# Patient Record
Sex: Female | Born: 1935 | Race: White | Hispanic: No | Smoking: Former smoker
Health system: Southern US, Community
[De-identification: ages and names within clinical notes are randomized; demographics above are authoritative.]

## PROBLEM LIST (undated history)

## (undated) DIAGNOSIS — S22079A Unspecified fracture of T9-T10 vertebra, initial encounter for closed fracture: Secondary | ICD-10-CM

## (undated) DIAGNOSIS — F32A Depression, unspecified: Secondary | ICD-10-CM

## (undated) DIAGNOSIS — R5383 Other fatigue: Secondary | ICD-10-CM

## (undated) DIAGNOSIS — D649 Anemia, unspecified: Secondary | ICD-10-CM

## (undated) DIAGNOSIS — E039 Hypothyroidism, unspecified: Secondary | ICD-10-CM

## (undated) DIAGNOSIS — E785 Hyperlipidemia, unspecified: Secondary | ICD-10-CM

## (undated) DIAGNOSIS — I951 Orthostatic hypotension: Secondary | ICD-10-CM

## (undated) DIAGNOSIS — R011 Cardiac murmur, unspecified: Secondary | ICD-10-CM

## (undated) DIAGNOSIS — F329 Major depressive disorder, single episode, unspecified: Secondary | ICD-10-CM

## (undated) DIAGNOSIS — K219 Gastro-esophageal reflux disease without esophagitis: Secondary | ICD-10-CM

## (undated) DIAGNOSIS — R42 Dizziness and giddiness: Secondary | ICD-10-CM

## (undated) DIAGNOSIS — Z8679 Personal history of other diseases of the circulatory system: Secondary | ICD-10-CM

## (undated) DIAGNOSIS — Z9889 Other specified postprocedural states: Secondary | ICD-10-CM

## (undated) DIAGNOSIS — A0472 Enterocolitis due to Clostridium difficile, not specified as recurrent: Secondary | ICD-10-CM

## (undated) DIAGNOSIS — E2740 Unspecified adrenocortical insufficiency: Secondary | ICD-10-CM

## (undated) DIAGNOSIS — J449 Chronic obstructive pulmonary disease, unspecified: Secondary | ICD-10-CM

## (undated) DIAGNOSIS — J189 Pneumonia, unspecified organism: Secondary | ICD-10-CM

## (undated) DIAGNOSIS — Z950 Presence of cardiac pacemaker: Secondary | ICD-10-CM

## (undated) HISTORY — DX: Depression, unspecified: F32.A

## (undated) HISTORY — DX: Unspecified fracture of t9-t10 vertebra, initial encounter for closed fracture: S22.079A

## (undated) HISTORY — DX: Hyperlipidemia, unspecified: E78.5

## (undated) HISTORY — DX: Other specified postprocedural states: Z98.890

## (undated) HISTORY — PX: EYE SURGERY: SHX253

## (undated) HISTORY — PX: BREAST SURGERY: SHX581

## (undated) HISTORY — DX: Other fatigue: R53.83

## (undated) HISTORY — PX: BACK SURGERY: SHX140

## (undated) HISTORY — DX: Gastro-esophageal reflux disease without esophagitis: K21.9

## (undated) HISTORY — DX: Anemia, unspecified: D64.9

## (undated) HISTORY — PX: CHOLECYSTECTOMY: SHX55

## (undated) HISTORY — PX: VAGINAL HYSTERECTOMY: SUR661

## (undated) HISTORY — DX: Hypothyroidism, unspecified: E03.9

## (undated) HISTORY — DX: Major depressive disorder, single episode, unspecified: F32.9

## (undated) HISTORY — PX: CLAVICLE SURGERY: SHX598

## (undated) HISTORY — DX: Personal history of other diseases of the circulatory system: Z86.79

---

## 1996-02-18 HISTORY — PX: CARDIAC CATHETERIZATION: SHX172

## 2001-04-21 ENCOUNTER — Ambulatory Visit (HOSPITAL_COMMUNITY): Admission: RE | Admit: 2001-04-21 | Discharge: 2001-04-21 | Payer: Self-pay | Admitting: Family Medicine

## 2001-04-21 ENCOUNTER — Encounter: Payer: Self-pay | Admitting: Family Medicine

## 2001-05-31 ENCOUNTER — Encounter: Payer: Self-pay | Admitting: Family Medicine

## 2001-05-31 ENCOUNTER — Ambulatory Visit (HOSPITAL_COMMUNITY): Admission: RE | Admit: 2001-05-31 | Discharge: 2001-05-31 | Payer: Self-pay | Admitting: Family Medicine

## 2001-06-25 ENCOUNTER — Encounter: Payer: Self-pay | Admitting: Cardiology

## 2001-06-25 ENCOUNTER — Ambulatory Visit (HOSPITAL_COMMUNITY): Admission: RE | Admit: 2001-06-25 | Discharge: 2001-06-25 | Payer: Self-pay | Admitting: Cardiology

## 2002-02-17 HISTORY — PX: OTHER SURGICAL HISTORY: SHX169

## 2002-02-21 ENCOUNTER — Ambulatory Visit (HOSPITAL_COMMUNITY): Admission: RE | Admit: 2002-02-21 | Discharge: 2002-02-21 | Payer: Self-pay | Admitting: Internal Medicine

## 2002-06-29 ENCOUNTER — Ambulatory Visit (HOSPITAL_COMMUNITY): Admission: RE | Admit: 2002-06-29 | Discharge: 2002-06-29 | Payer: Self-pay | Admitting: Family Medicine

## 2002-06-29 ENCOUNTER — Encounter: Payer: Self-pay | Admitting: Family Medicine

## 2002-07-04 ENCOUNTER — Ambulatory Visit (HOSPITAL_COMMUNITY): Admission: RE | Admit: 2002-07-04 | Discharge: 2002-07-04 | Payer: Self-pay | Admitting: Family Medicine

## 2002-07-04 ENCOUNTER — Encounter: Payer: Self-pay | Admitting: Family Medicine

## 2002-07-07 ENCOUNTER — Other Ambulatory Visit: Admission: RE | Admit: 2002-07-07 | Discharge: 2002-07-07 | Payer: Self-pay | Admitting: Unknown Physician Specialty

## 2002-08-09 ENCOUNTER — Ambulatory Visit (HOSPITAL_COMMUNITY): Admission: RE | Admit: 2002-08-09 | Discharge: 2002-08-09 | Payer: Self-pay | Admitting: General Surgery

## 2004-12-31 ENCOUNTER — Emergency Department (HOSPITAL_COMMUNITY): Admission: EM | Admit: 2004-12-31 | Discharge: 2004-12-31 | Payer: Self-pay | Admitting: Emergency Medicine

## 2005-01-01 ENCOUNTER — Ambulatory Visit: Payer: Self-pay | Admitting: Orthopedic Surgery

## 2005-01-22 ENCOUNTER — Ambulatory Visit: Payer: Self-pay | Admitting: Orthopedic Surgery

## 2005-03-07 ENCOUNTER — Ambulatory Visit (HOSPITAL_COMMUNITY): Admission: RE | Admit: 2005-03-07 | Discharge: 2005-03-07 | Payer: Self-pay | Admitting: Family Medicine

## 2006-09-21 ENCOUNTER — Ambulatory Visit (HOSPITAL_COMMUNITY): Admission: RE | Admit: 2006-09-21 | Discharge: 2006-09-21 | Payer: Self-pay | Admitting: Family Medicine

## 2008-05-22 ENCOUNTER — Ambulatory Visit: Payer: Self-pay | Admitting: Cardiology

## 2008-05-22 ENCOUNTER — Ambulatory Visit (HOSPITAL_COMMUNITY): Admission: RE | Admit: 2008-05-22 | Discharge: 2008-05-22 | Payer: Self-pay | Admitting: Family Medicine

## 2008-05-22 ENCOUNTER — Encounter: Payer: Self-pay | Admitting: Family Medicine

## 2008-05-31 ENCOUNTER — Ambulatory Visit: Payer: Self-pay | Admitting: Cardiology

## 2008-06-01 ENCOUNTER — Ambulatory Visit (HOSPITAL_COMMUNITY): Admission: RE | Admit: 2008-06-01 | Discharge: 2008-06-01 | Payer: Self-pay | Admitting: Cardiology

## 2008-06-04 ENCOUNTER — Ambulatory Visit: Payer: Self-pay | Admitting: Cardiology

## 2008-06-27 ENCOUNTER — Ambulatory Visit: Payer: Self-pay | Admitting: Cardiology

## 2008-07-07 ENCOUNTER — Ambulatory Visit (HOSPITAL_COMMUNITY): Payer: Self-pay | Admitting: Oncology

## 2008-07-07 ENCOUNTER — Encounter (HOSPITAL_COMMUNITY): Admission: RE | Admit: 2008-07-07 | Discharge: 2008-08-06 | Payer: Self-pay | Admitting: Oncology

## 2008-08-16 ENCOUNTER — Encounter (HOSPITAL_COMMUNITY): Admission: RE | Admit: 2008-08-16 | Discharge: 2008-09-15 | Payer: Self-pay | Admitting: Oncology

## 2008-08-29 ENCOUNTER — Ambulatory Visit (HOSPITAL_COMMUNITY): Payer: Self-pay | Admitting: Oncology

## 2008-09-28 ENCOUNTER — Encounter (HOSPITAL_COMMUNITY): Admission: RE | Admit: 2008-09-28 | Discharge: 2008-10-28 | Payer: Self-pay | Admitting: Oncology

## 2008-10-24 DIAGNOSIS — E039 Hypothyroidism, unspecified: Secondary | ICD-10-CM | POA: Insufficient documentation

## 2008-10-24 DIAGNOSIS — Z8659 Personal history of other mental and behavioral disorders: Secondary | ICD-10-CM

## 2008-10-24 DIAGNOSIS — E785 Hyperlipidemia, unspecified: Secondary | ICD-10-CM

## 2008-10-24 DIAGNOSIS — I2510 Atherosclerotic heart disease of native coronary artery without angina pectoris: Secondary | ICD-10-CM | POA: Insufficient documentation

## 2008-10-24 HISTORY — DX: Hyperlipidemia, unspecified: E78.5

## 2008-12-14 ENCOUNTER — Encounter (HOSPITAL_COMMUNITY): Admission: RE | Admit: 2008-12-14 | Discharge: 2009-01-13 | Payer: Self-pay | Admitting: Oncology

## 2008-12-14 ENCOUNTER — Ambulatory Visit (HOSPITAL_COMMUNITY): Payer: Self-pay | Admitting: Oncology

## 2009-01-04 ENCOUNTER — Telehealth: Payer: Self-pay | Admitting: Cardiology

## 2009-01-05 ENCOUNTER — Ambulatory Visit: Payer: Self-pay | Admitting: Cardiology

## 2009-01-16 ENCOUNTER — Ambulatory Visit: Payer: Self-pay | Admitting: Cardiology

## 2009-01-17 ENCOUNTER — Ambulatory Visit: Payer: Self-pay | Admitting: Internal Medicine

## 2009-01-17 DIAGNOSIS — I498 Other specified cardiac arrhythmias: Secondary | ICD-10-CM

## 2009-01-17 HISTORY — PX: INSERT / REPLACE / REMOVE PACEMAKER: SUR710

## 2009-01-18 ENCOUNTER — Encounter: Payer: Self-pay | Admitting: Internal Medicine

## 2009-01-18 ENCOUNTER — Telehealth: Payer: Self-pay | Admitting: Cardiology

## 2009-01-19 LAB — CONVERTED CEMR LAB
BUN: 19 mg/dL (ref 6–23)
CO2: 26 meq/L (ref 19–32)
Calcium: 8.9 mg/dL (ref 8.4–10.5)
Eosinophils Relative: 3 % (ref 0–5)
HCT: 30.3 % — ABNORMAL LOW (ref 36.0–46.0)
Hemoglobin: 10.1 g/dL — ABNORMAL LOW (ref 12.0–15.0)
Lymphs Abs: 2.3 10*3/uL (ref 0.7–4.0)
MCHC: 33.3 g/dL (ref 30.0–36.0)
Monocytes Relative: 9 % (ref 3–12)
Neutro Abs: 5 10*3/uL (ref 1.7–7.7)
Prothrombin Time: 12.5 s (ref 11.6–15.2)

## 2009-01-23 ENCOUNTER — Ambulatory Visit: Payer: Self-pay | Admitting: Internal Medicine

## 2009-01-23 ENCOUNTER — Inpatient Hospital Stay (HOSPITAL_COMMUNITY): Admission: AD | Admit: 2009-01-23 | Discharge: 2009-01-24 | Payer: Self-pay | Admitting: Internal Medicine

## 2009-01-24 ENCOUNTER — Encounter: Payer: Self-pay | Admitting: Internal Medicine

## 2009-01-25 ENCOUNTER — Encounter: Payer: Self-pay | Admitting: Cardiology

## 2009-02-05 ENCOUNTER — Ambulatory Visit: Payer: Self-pay | Admitting: Cardiology

## 2009-02-06 ENCOUNTER — Ambulatory Visit (HOSPITAL_COMMUNITY): Payer: Self-pay | Admitting: Oncology

## 2009-02-20 ENCOUNTER — Encounter (HOSPITAL_COMMUNITY): Admission: RE | Admit: 2009-02-20 | Discharge: 2009-03-22 | Payer: Self-pay | Admitting: Oncology

## 2009-03-05 ENCOUNTER — Emergency Department (HOSPITAL_COMMUNITY)
Admission: EM | Admit: 2009-03-05 | Discharge: 2009-03-05 | Payer: Self-pay | Source: Home / Self Care | Admitting: Emergency Medicine

## 2009-03-06 DIAGNOSIS — M949 Disorder of cartilage, unspecified: Secondary | ICD-10-CM

## 2009-03-06 DIAGNOSIS — M810 Age-related osteoporosis without current pathological fracture: Secondary | ICD-10-CM | POA: Insufficient documentation

## 2009-03-06 DIAGNOSIS — Z860100 Personal history of colon polyps, unspecified: Secondary | ICD-10-CM | POA: Insufficient documentation

## 2009-03-06 DIAGNOSIS — M899 Disorder of bone, unspecified: Secondary | ICD-10-CM | POA: Insufficient documentation

## 2009-03-06 DIAGNOSIS — K219 Gastro-esophageal reflux disease without esophagitis: Secondary | ICD-10-CM | POA: Insufficient documentation

## 2009-03-06 DIAGNOSIS — F329 Major depressive disorder, single episode, unspecified: Secondary | ICD-10-CM

## 2009-03-06 DIAGNOSIS — R197 Diarrhea, unspecified: Secondary | ICD-10-CM

## 2009-03-07 ENCOUNTER — Ambulatory Visit: Payer: Self-pay | Admitting: Internal Medicine

## 2009-03-07 ENCOUNTER — Ambulatory Visit: Payer: Self-pay | Admitting: Gastroenterology

## 2009-03-07 ENCOUNTER — Ambulatory Visit (HOSPITAL_COMMUNITY): Admission: RE | Admit: 2009-03-07 | Discharge: 2009-03-07 | Payer: Self-pay | Admitting: Internal Medicine

## 2009-03-07 DIAGNOSIS — R1319 Other dysphagia: Secondary | ICD-10-CM

## 2009-03-07 DIAGNOSIS — D126 Benign neoplasm of colon, unspecified: Secondary | ICD-10-CM

## 2009-03-12 ENCOUNTER — Encounter: Payer: Self-pay | Admitting: Internal Medicine

## 2009-04-25 ENCOUNTER — Ambulatory Visit: Payer: Self-pay | Admitting: Internal Medicine

## 2009-04-25 DIAGNOSIS — Z950 Presence of cardiac pacemaker: Secondary | ICD-10-CM | POA: Insufficient documentation

## 2009-07-10 ENCOUNTER — Ambulatory Visit (HOSPITAL_COMMUNITY): Payer: Self-pay | Admitting: Oncology

## 2009-07-10 ENCOUNTER — Encounter (HOSPITAL_COMMUNITY): Admission: RE | Admit: 2009-07-10 | Discharge: 2009-08-09 | Payer: Self-pay | Admitting: Oncology

## 2010-01-14 ENCOUNTER — Encounter (HOSPITAL_COMMUNITY): Admission: RE | Admit: 2010-01-14 | Payer: Self-pay | Admitting: Oncology

## 2010-03-18 ENCOUNTER — Ambulatory Visit: Admit: 2010-03-18 | Payer: Self-pay | Admitting: Family Medicine

## 2010-03-19 NOTE — Assessment & Plan Note (Signed)
Summary: FOREIGN BODY SENSATION WHEN SHE SWALLOWS/SS   Visit Type:  Initial Consult Referring Provider:  [facility] ED, Sunnie Nielsen Primary Care Provider:  [name]  Chief Complaint:  foreign body sensation when swallows.  History of Present Illness: Ms. Kaitlyn Cooper is a pleasant 77 y/o WF, patient of Dr. [name], who presents today at the request of Dr. Sunnie Nielsen with [facility] ED for further evaluation of dysphagia and recent foreign body sensation.  Patient c/o progressive dysphagia for three years to mostly solids but sometimes pills.  On Monday, after breakfast, she had sensation of food stuck in esophagus.  She had pain with swallowing her saliva.  In ED, she was able to swallow her saliva.  CXR showed left lung base ATX, COPD.  Soft tissue neck film showed ?enlarged thyroid.  She was on Zantac for heartburn.  Dr. Dierdre Highman started her on omeprazole.  Denies N/V, abd pain, melena, brbpr, constipation, diarrhea. Yesterday she had difficulty swallowing her pills.  She is on a soft diet right now.       Current Medications (verified): 1)  Synthroid 75 Mcg Tabs (Levothyroxine Sodium) .... Take 1 Tab Daily 2)  Zoloft 100 Mg Tabs (Sertraline Hcl) .... Take 1 Tab Daily 3)  Premarin 0.625 Mg Tabs (Estrogens Conjugated) .... Take 1 Tab Daily 4)  Mevacor 20 Mg Tabs (Lovastatin) .... Take 1 Tab Daily 5)  Vitamin D 400 Unit Tabs (Cholecalciferol) .... Take 1 Tab Daily 6)  Vitamin B-12 100 Mcg Tabs (Cyanocobalamin) .... Injections 1 Time Monthly 7)  Tylenol 325 Mg Tabs (Acetaminophen) .... As Needed 8)  Prilosec Otc  20 Mg .... Twice Daily X 3 Days Beginning 03/06/2009, Then Once Daily  Allergies (verified): 1)  ! Penicillin  Past History:  Past Medical History: Current Problems:  ANEMIA (ICD-285.9), followed by Dr. Mariel Sleet HYPERLIPIDEMIA (ICD-272.4) DEPRESSION, HX OF (ICD-V11.8) HYPOTHYROIDISM (ICD-244.9) ATHEROSCLEROTIC CARDIOVASCULAR DISEASE (ICD-429.2) TCS, 02/2002 showed few diverticula  at sigmoid colon, otherwise normal colonoscopy and     terminal ileoscopy. TCS, 1995 several colon polyps, tubular adenoma EGD at time of cholecystectomy, late 1990s Chronic GERD [employer] Jude Cardiac pacemaker place 01/17/09.  Past Surgical History: cath 1998 Cholecystectomy Excision of right temporal carcinoma, 2004  Family History: Father:deceased had parkinson disease Mother:deceased alzhimers, colon polyp with invasive carcinoma s/p segmental resection and lived >20 years Siblings:2 sisters alive and well  Social History: Retired  Married, no children Tobacco Use - No, remote Alcohol Use - no Regular Exercise - no Drug Use - no  Review of Systems General:  Denies fever, chills, sweats, anorexia, fatigue, weakness, and weight loss. Eyes:  Denies vision loss. ENT:  Complains of difficulty swallowing; denies nasal congestion, sore throat, and hoarseness. CV:  Denies chest pains, angina, palpitations, dyspnea on exertion, and peripheral edema. Resp:  Denies dyspnea at rest, dyspnea with exercise, and cough. GI:  See HPI. GU:  Denies urinary burning and blood in urine. MS:  Complains of joint pain / LOM. Derm:  Denies rash and itching. Neuro:  Denies weakness, paralysis, frequent headaches, memory loss, and confusion. Psych:  Denies depression and anxiety. Endo:  Denies unusual weight change. Heme:  Denies bruising and bleeding. Allergy:  Denies hives and rash.  Vital Signs:  Patient profile:   77 year old female Height:      64 inches Weight:      119.50 pounds BMI:     20.59 Temp:     97.5 degrees F oral Pulse rate:   64 /  minute BP sitting:   100 / 60  (left arm) Cuff size:   regular  Vitals Entered By: Cloria Spring LPN (March 07, 2009 11:44 AM)  Physical Exam  General:  Well developed, well nourished, no acute distress. Head:  Normocephalic and atraumatic. Eyes:  Conjunctivae pink, no scleral icterus.  Mouth:  Oropharyngeal mucosa moist, pink.  No lesions,  erythema or exudate.    Neck:  Supple; no masses or thyromegaly. Lungs:  Clear throughout to auscultation. Heart:  Regular rate and rhythm; no murmurs, rubs,  or bruits. Abdomen:  Bowel sounds normal.  Abdomen is soft, nontender, nondistended.  No rebound or guarding.  No hepatosplenomegaly, masses or hernias.  No abdominal bruits.  Extremities:  No clubbing, cyanosis, edema or deformities noted. Neurologic:  Alert and  oriented x4;  grossly normal neurologically. Skin:  Intact without significant lesions or rashes. Cervical Nodes:  No significant cervical adenopathy. Psych:  Alert and cooperative. Normal mood and affect.  Impression & Recommendations:  Problem # 1:  DYSPHAGIA (ZOX-096.04)  Three year h/o dysphagia.  Recent episode of foreign body sensation with odynophagia.  ?esophageal stricture with transient obstruction +/- esophagitis.  Discussed with Dr. Jena Gauss.  Will proceed with EGD/ED today.  EGD to be performed in near future.  Risks, alternatives, benefits including but not limited to risk of reaction to medications, bleeding, infection, and perforation addressed.  Patient voiced understanding and verbal consent obtained. She will continue omeprazole as planned.  Problem # 2:  COLONIC POLYPS, ADENOMATOUS (ICD-211.3)  H/O tubular adenoma in 1995.  No polyps in 2004.  Due for surveillance TCS. Will wait until dysphagia is addressed, then schedule TCS if patient agrees.  She has been reluctant to undergo TCS in past.  Problem # 3:  GASTROESOPHAGEAL REFLUX DISEASE, CHRONIC (ICD-530.81)  Recently on Zantac.  Agree with PPI. EGD as planned.  Other Orders: Consultation Level III (54098)    I would like to thank Dr. Sunnie Nielsen for allowing [place] to take part in the care of this nice patient.

## 2010-03-19 NOTE — Letter (Signed)
Summary: EGD ORDER  EGD ORDER   Imported By: Diana Eves 03/12/2009 14:08:23  _____________________________________________________________________  External Attachment:    Type:   Image     Comment:   External Document

## 2010-03-19 NOTE — Assessment & Plan Note (Signed)
Summary: 3 mth post hosp f/u per Loura Pardon on 01/24/09/tg   Visit Type:  Follow-up Referring Provider:  [facility] ED, Sunnie Nielsen Primary Provider:  [name]  CC:  no complaints today.  History of Present Illness: Kaitlyn Cooper returns today for followup.  She is a pleasant 77 yo woman with a h/o symptomatic bradycardia s/p PPM.  She feels improved but does not have as much additional energy as she thought she would.  Rare palpitations. No syncope or dizzy spells.  Current Medications (verified): 1)  Synthroid 75 Mcg Tabs (Levothyroxine Sodium) .... Take 1 Tab Daily 2)  Zoloft 100 Mg Tabs (Sertraline Hcl) .... Take 1 Tab Daily 3)  Premarin 0.625 Mg Tabs (Estrogens Conjugated) .... Take 1 Tab Daily 4)  Mevacor 20 Mg Tabs (Lovastatin) .... Take 1 Tab Daily 5)  Vitamin D 400 Unit Tabs (Cholecalciferol) .... Take 1 Tab Daily 6)  Vitamin B-12 100 Mcg Tabs (Cyanocobalamin) .... Injections 1 Time Monthly 7)  Tylenol 325 Mg Tabs (Acetaminophen) .... As Needed 8)  Prilosec Otc  20 Mg .... Twice Daily X 3 Days Beginning 03/06/2009, Then Once Daily  Allergies (verified): 1)  ! Penicillin  Past History:  Past Medical History: Last updated: 03/07/2009 Current Problems:  ANEMIA (ICD-285.9), followed by Dr. Alethia Berthold (ICD-272.4) DEPRESSION, HX OF (ICD-V11.8) HYPOTHYROIDISM (ICD-244.9) ATHEROSCLEROTIC CARDIOVASCULAR DISEASE (ICD-429.2) TCS, 02/2002 showed few diverticula at sigmoid colon, otherwise normal colonoscopy and     terminal ileoscopy. TCS, 1995 several colon polyps, tubular adenoma EGD at time of cholecystectomy, late 1990s Chronic GERD St Jude Cardiac pacemaker place 01/17/09.  Past Surgical History: Last updated: 03/07/2009 cath 1998 Cholecystectomy Excision of right temporal carcinoma, 2004  Review of Systems  The patient denies chest pain, syncope, dyspnea on exertion, and peripheral edema.    Vital Signs:  Patient profile:   77 year old female Weight:       122 pounds Pulse rate:   60 / minute BP sitting:   150 / 63  (right arm)  Vitals Entered By: Dreama Saa, CNA (April 25, 2009 3:31 PM)  Physical Exam  General:  Elderly, well developed, well nourished, in no acute distress.  HEENT: normal Neck: supple. No JVD. Carotids 2+ bilaterally no bruits Cor: RRR no rubs, gallops or murmur Lungs: CTA. Well healed PPM incision. Ab: soft, nontender. nondistended. No HSM. Good bowel sounds Ext: warm. no cyanosis, clubbing or edema Neuro: alert and oriented. Grossly nonfocal. affect pleasant    PPM Specifications Following MD:  Lewayne Bunting, MD     PPM Vendor:  [employer] Jude     PPM Model Number:  [id number]     PPM Serial Number:  [id number] PPM DOI:  01/23/2009     PPM Implanting MD:  Lewayne Bunting, MD  Lead 1    Location: RA     DOI: 01/23/2009     Model #: [id number]     Serial #: [id number]     Status: active Lead 2    Location: RV     DOI: 01/23/2009     Model #: [id number]     Serial #: [id number]     Status: active  Magnet Response Rate:  BOL 100 ERI 85    PPM Follow Up Remote Check?  No Battery Voltage:  2.98 V     Battery Est. Longevity:  10.4 years     Pacer Dependent:  No       PPM Device Measurements Atrium  Amplitude: 4.7 mV, Impedance:  480 ohms, Threshold: 0.875 V at 0.4 msec Right Ventricle  Amplitude: 12 mV, Impedance: 650 ohms, Threshold: 0.625 V at 0.4 msec  Episodes MS Episodes:  8     Percent Mode Switch:  <1%     Coumadin:  No Atrial Pacing:  87%     Ventricular Pacing:  <1%  Parameters Mode:  DDDR     Lower Rate Limit:  60     Upper Rate Limit:  110 Paced AV Delay:  250     Sensed AV Delay:  250 Next Cardiology Appt Due:  01/17/2010 Tech Comments:  No parameter changes.  Device function normal.  ROV 12/11 with Dr. Ladona Ridgel in [facility] Altha Harm, LPN  April 25, 4032 4:17 PM  MD Comments:  Agree with above.  Impression & Recommendations:  Problem # 1:  CARDIAC PACEMAKER IN SITU (ICD-V45.01) Her device is working normally.  Will  recheck in 9 months.  Problem # 2:  ESSENTIAL HYPERTENSION, BENIGN (ICD-401.1) Her blood pressure is elevated today.  I have asked her to continue to monitor her blood pressure. She may need medication for HTN.

## 2010-03-19 NOTE — Miscellaneous (Signed)
Summary: Home Care Report  Home Care Report   Imported By: Faythe Ghee 03/12/2009 13:38:03  _____________________________________________________________________  External Attachment:    Type:   Image     Comment:   External Document

## 2010-03-25 ENCOUNTER — Encounter: Payer: Self-pay | Admitting: Internal Medicine

## 2010-03-28 ENCOUNTER — Encounter (INDEPENDENT_AMBULATORY_CARE_PROVIDER_SITE_OTHER): Payer: Self-pay | Admitting: *Deleted

## 2010-04-04 NOTE — Letter (Signed)
Summary: Appointment - Missed  [facility] [place]  [address] [place] [address]   Phone: [phone]  Fax: [phone]     March 28, 2010 MRN: [id number]   Kaitlyn Cooper [address] [place], [place]  [address]   Dear Ms. Kaitlyn Cooper,  Our records indicate you missed your appointment on    03/25/10                    with Dr. Ladona Ridgel      .                                    It is very important that we reach you to reschedule this appointment. We look forward to participating in your health care needs. Please contact us at the number listed above at your earliest convenience to reschedule this appointment.     Sincerely,    Glass blower/designer

## 2010-04-26 ENCOUNTER — Encounter: Payer: Self-pay | Admitting: *Deleted

## 2010-05-05 LAB — RETICULIN ANTIBODIES, IGA W TITER: Reticulin Ab, IgA: NEGATIVE

## 2010-05-05 LAB — TISSUE TRANSGLUTAMINASE, IGA: Tissue Transglutaminase Ab, IgA: 1.4 U/mL (ref <7)

## 2010-05-05 LAB — GLIADIN ANTIBODIES, SERUM
Gliadin IgA: 2 U/mL (ref <7)
Gliadin IgG: 0.5 U/mL (ref <7)

## 2010-05-06 LAB — C-REACTIVE PROTEIN: CRP: 0.6 mg/dL — ABNORMAL HIGH (ref <0.6)

## 2010-05-06 LAB — CBC
HCT: 31.3 % — ABNORMAL LOW (ref 36.0–46.0)
MCV: 90.5 fL (ref 78.0–100.0)
Platelets: 263 10*3/uL (ref 150–400)
RBC: 3.45 MIL/uL — ABNORMAL LOW (ref 3.87–5.11)

## 2010-05-06 LAB — SEDIMENTATION RATE: Sed Rate: 68 mm/hr — ABNORMAL HIGH (ref 0–22)

## 2010-05-20 LAB — CBC
HCT: 33 % — ABNORMAL LOW (ref 36.0–46.0)
Hemoglobin: 11.1 g/dL — ABNORMAL LOW (ref 12.0–15.0)
MCV: 92.5 fL (ref 78.0–100.0)
Platelets: 239 10*3/uL (ref 150–400)
RBC: 3.57 MIL/uL — ABNORMAL LOW (ref 3.87–5.11)
WBC: 8.1 10*3/uL (ref 4.0–10.5)

## 2010-05-20 LAB — SEDIMENTATION RATE: Sed Rate: 70 mm/hr — ABNORMAL HIGH (ref 0–22)

## 2010-05-23 LAB — CBC
Platelets: 249 10*3/uL (ref 150–400)
RBC: 3.34 MIL/uL — ABNORMAL LOW (ref 3.87–5.11)

## 2010-05-23 LAB — DIFFERENTIAL
Eosinophils Relative: 3 % (ref 0–5)
Lymphocytes Relative: 27 % (ref 12–46)
Lymphs Abs: 2.4 10*3/uL (ref 0.7–4.0)
Monocytes Absolute: 0.7 10*3/uL (ref 0.1–1.0)

## 2010-05-23 LAB — SEDIMENTATION RATE: Sed Rate: 68 mm/hr — ABNORMAL HIGH (ref 0–22)

## 2010-05-23 LAB — IRON AND TIBC
Saturation Ratios: 17 % — ABNORMAL LOW (ref 20–55)
UIBC: 309 ug/dL

## 2010-05-25 LAB — DIFFERENTIAL
Basophils Absolute: 0.1 10*3/uL (ref 0.0–0.1)
Basophils Relative: 1 % (ref 0–1)
Eosinophils Absolute: 0.3 10*3/uL (ref 0.0–0.7)
Lymphs Abs: 2.3 10*3/uL (ref 0.7–4.0)
Neutrophils Relative %: 59 % (ref 43–77)

## 2010-05-25 LAB — CBC
MCHC: 34.8 g/dL (ref 30.0–36.0)
Platelets: 261 10*3/uL (ref 150–400)
RBC: 3.39 MIL/uL — ABNORMAL LOW (ref 3.87–5.11)

## 2010-05-25 LAB — RETICULOCYTES: Retic Ct Pct: 1 % (ref 0.4–3.1)

## 2010-05-25 LAB — C-REACTIVE PROTEIN: CRP: 0.8 mg/dL — ABNORMAL HIGH (ref <0.6)

## 2010-05-25 LAB — SEDIMENTATION RATE: Sed Rate: 60 mm/hr — ABNORMAL HIGH (ref 0–22)

## 2010-05-26 LAB — PROTEIN, URINE, 24 HOUR
Protein, Urine: <6 mg/dL
Urine Total Volume-UPROT: 1325 mL

## 2010-05-26 LAB — CBC
MCHC: 34.4 g/dL (ref 30.0–36.0)
MCV: 91.7 fL (ref 78.0–100.0)
RDW: 14 % (ref 11.5–15.5)

## 2010-05-26 LAB — UIFE/LIGHT CHAINS/TP QN, 24-HR UR
Alpha 1, Urine: NOT DETECTED
Beta, Urine: NOT DETECTED
Free Kappa Lt Chains,Ur: 1.28 mg/dL (ref 0.04–1.51)
Free Lambda Excretion/Day: 2.12 mg/d
Free Lambda Lt Chains,Ur: 0.16 mg/dL (ref 0.08–1.01)
Gamma Globulin, Urine: NOT DETECTED
Time: 24 hours
Total Protein, Urine-Ur/day: 27 mg/d (ref 10–140)

## 2010-05-26 LAB — BASIC METABOLIC PANEL
BUN: 23 mg/dL (ref 6–23)
CO2: 23 mEq/L (ref 19–32)
Calcium: 9.3 mg/dL (ref 8.4–10.5)
Creatinine, Ser: 1.19 mg/dL (ref 0.4–1.2)
GFR calc Af Amer: 53 mL/min — ABNORMAL LOW (ref >60)
Glucose, Bld: 126 mg/dL — ABNORMAL HIGH (ref 70–99)

## 2010-05-26 LAB — DIFFERENTIAL
Basophils Absolute: 0 10*3/uL (ref 0.0–0.1)
Basophils Relative: 1 % (ref 0–1)
Eosinophils Absolute: 0.2 10*3/uL (ref 0.0–0.7)
Neutro Abs: 4.4 10*3/uL (ref 1.7–7.7)
Neutrophils Relative %: 57 % (ref 43–77)

## 2010-05-26 LAB — CREATININE CLEARANCE, URINE, 24 HOUR
Collection Interval-CRCL: 24 hours
Creatinine, 24H Ur: 503 mg/d — ABNORMAL LOW (ref 700–1800)
Urine Total Volume-CRCL: 1325 mL

## 2010-05-27 LAB — CBC
Hemoglobin: 10.2 g/dL — ABNORMAL LOW (ref 12.0–15.0)
MCHC: 34.6 g/dL (ref 30.0–36.0)
MCV: 91.8 fL (ref 78.0–100.0)
RBC: 3.21 MIL/uL — ABNORMAL LOW (ref 3.87–5.11)
RDW: 14.4 % (ref 11.5–15.5)

## 2010-05-27 LAB — DIFFERENTIAL
Basophils Absolute: 0 10*3/uL (ref 0.0–0.1)
Basophils Relative: 0 % (ref 0–1)
Eosinophils Absolute: 0.2 10*3/uL (ref 0.0–0.7)
Eosinophils Relative: 3 % (ref 0–5)
Monocytes Absolute: 0.7 10*3/uL (ref 0.1–1.0)
Monocytes Relative: 8 % (ref 3–12)
Neutro Abs: 4.9 10*3/uL (ref 1.7–7.7)

## 2010-05-28 LAB — COMPREHENSIVE METABOLIC PANEL
ALT: 9 U/L (ref 0–35)
Albumin: 3.4 g/dL — ABNORMAL LOW (ref 3.5–5.2)
Alkaline Phosphatase: 63 U/L (ref 39–117)
Calcium: 9.3 mg/dL (ref 8.4–10.5)
GFR calc Af Amer: >60 mL/min (ref >60)
Glucose, Bld: 82 mg/dL (ref 70–99)
Potassium: 4.2 mEq/L (ref 3.5–5.1)
Sodium: 139 mEq/L (ref 135–145)
Total Protein: 7.2 g/dL (ref 6.0–8.3)

## 2010-05-28 LAB — PROTEIN ELECTROPH W RFLX QUANT IMMUNOGLOBULINS
Alpha-1-Globulin: 5 % — ABNORMAL HIGH (ref 2.9–4.9)
Alpha-2-Globulin: 13.4 % — ABNORMAL HIGH (ref 7.1–11.8)
Gamma Globulin: 16.1 % (ref 11.1–18.8)
Total Protein ELP: 7.5 g/dL (ref 6.0–8.3)

## 2010-05-28 LAB — FERRITIN: Ferritin: 26 ng/mL (ref 10–291)

## 2010-05-28 LAB — IRON AND TIBC
Iron: 83 ug/dL (ref 42–135)
TIBC: 393 ug/dL (ref 250–470)
UIBC: 310 ug/dL

## 2010-05-28 LAB — DIFFERENTIAL
Eosinophils Absolute: 0.2 10*3/uL (ref 0.0–0.7)
Lymphs Abs: 2.6 10*3/uL (ref 0.7–4.0)
Monocytes Absolute: 0.8 10*3/uL (ref 0.1–1.0)
Monocytes Relative: 10 % (ref 3–12)
Neutrophils Relative %: 56 % (ref 43–77)

## 2010-05-28 LAB — CBC
MCHC: 35.1 g/dL (ref 30.0–36.0)
Platelets: 254 10*3/uL (ref 150–400)
RDW: 15 % (ref 11.5–15.5)

## 2010-05-28 LAB — FOLATE: Folate: >20 ng/mL

## 2010-05-28 LAB — IMMUNOFIXATION ELECTROPHORESIS
IgA: 594 mg/dL — ABNORMAL HIGH (ref 68–378)
IgG (Immunoglobin G), Serum: 1170 mg/dL (ref 694–1618)

## 2010-05-28 LAB — RETICULOCYTES: RBC.: 3.19 MIL/uL — ABNORMAL LOW (ref 3.87–5.11)

## 2010-06-04 ENCOUNTER — Encounter: Payer: Self-pay | Admitting: Internal Medicine

## 2010-06-04 ENCOUNTER — Ambulatory Visit (INDEPENDENT_AMBULATORY_CARE_PROVIDER_SITE_OTHER): Payer: Medicare Other | Admitting: Internal Medicine

## 2010-06-04 VITALS — BP 101/57 | HR 82 | Ht 64.0 in | Wt 118.0 lb

## 2010-06-04 DIAGNOSIS — I10 Essential (primary) hypertension: Secondary | ICD-10-CM

## 2010-06-04 DIAGNOSIS — E785 Hyperlipidemia, unspecified: Secondary | ICD-10-CM

## 2010-06-04 DIAGNOSIS — Z950 Presence of cardiac pacemaker: Secondary | ICD-10-CM

## 2010-06-04 DIAGNOSIS — I498 Other specified cardiac arrhythmias: Secondary | ICD-10-CM

## 2010-06-04 NOTE — Assessment & Plan Note (Signed)
Continue a low fat diet and statin therapy.

## 2010-06-04 NOTE — Progress Notes (Signed)
HPI Kaitlyn Cooper returns today for followup. She is a pleasant 77 yo woman with a h/o HTN, symptomatic bradycardia, s/p PPM insertion, and dyslipidemia. She notes that when she goes to bed at night she will experience non-cardiac chest pain. No relation to exertion and no associated sob. She denies palpitations. Allergies  Allergen Reactions  . Penicillins     REACTION: severe rash     Current Outpatient Prescriptions  Medication Sig Dispense Refill  . acetaminophen (TYLENOL) 325 MG tablet Take 650 mg by mouth every 6 (six) hours as needed.        . Cholecalciferol (VITAMIN D) 400 UNITS capsule Take 400 Units by mouth daily.        Marland Kitchen estrogens, conjugated, (PREMARIN) 0.625 MG tablet Take 0.625 mg by mouth daily. Take daily for 21 days then do not take for 7 days.       Marland Kitchen levothyroxine (SYNTHROID, LEVOTHROID) 75 MCG tablet Take 75 mcg by mouth daily.        Marland Kitchen lovastatin (MEVACOR) 20 MG tablet Take 20 mg by mouth at bedtime.        . ranitidine (ZANTAC) 150 MG tablet Take 150 mg by mouth 2 (two) times daily.        . sertraline (ZOLOFT) 100 MG tablet Take 100 mg by mouth daily.        Marland Kitchen DISCONTD: Cyanocobalamin (VITAMIN B-12 IJ) Inject 1 applicator as directed as directed.        Marland Kitchen DISCONTD: omeprazole (PRILOSEC) 20 MG capsule Take 20 mg by mouth daily.           Past Medical History  Diagnosis Date  . Anemia     followed by Dr.Neijstrom  . Hyperlipidemia   . Depression   . Hypothyroidism   . History of atherosclerotic cardiovascular disease   . History of colonoscopy     02/2002 showed few diverticula at sigmoid colon otherwise normal colonoscopy and terminal ileoscopy 1995 several colon polyps ,tubular adenoma  . GERD (gastroesophageal reflux disease)     chronic  . GERD (gastroesophageal reflux disease)     chronic gerd    ROS:   All systems reviewed and negative except as noted in the HPI.   Past Surgical History  Procedure Date  . Insert / replace / remove pacemaker  01/17/2009    [employer]Jude Cardiac Pacemaker   . Cardiac catheterization 1998  . Cholecystectomy   . Temporal carcinoma 2004    excision of right temporal carcinoma      No family history on file.   History   Social History  . Marital Status: Married    Spouse Name: N/A    Number of Children: N/A  . Years of Education: N/A   Occupational History  . Not on file.   Social History Main Topics  . Smoking status: Not on file  . Smokeless tobacco: Never Used  . Alcohol Use: No  . Drug Use: No  . Sexually Active: Not on file   Other Topics Concern  . Not on file   Social History Narrative  . No narrative on file     BP 101/57  Pulse 82  Ht 5\' 4"  (1.626 m)  Wt 118 lb (53.524 kg)  BMI 20.25 kg/m2  Physical Exam:  Well appearing NAD HEENT: Unremarkable Neck:  No JVD, no thyromegally Lymphatics:  No adenopathy Back:  No CVA tenderness Lungs:  Clear. Well healed PPM incision. HEART:  Regular rate rhythm, no murmurs, no  rubs, no clicks Abd:  Flat, positive bowel sounds, no organomegally, no rebound, no guarding Ext:  2 plus pulses, no edema, no cyanosis, no clubbing Skin:  No rashes no nodules Neuro:  CN II through XII intact, motor grossly intact  DEVICE  Normal device function.  See PaceArt for details.   Assess/Plan:

## 2010-06-04 NOTE — Patient Instructions (Signed)
Your physician recommends that you schedule a follow-up appointment in: 6 months for device check and in 1 year with Dr. Taylor  

## 2010-06-04 NOTE — Assessment & Plan Note (Signed)
Her blood pressure is well controlled. Continue current meds. She will maintain a low sodium dietl.

## 2010-06-04 NOTE — Assessment & Plan Note (Signed)
Her device is working normally. Will recheck in several months. 

## 2010-07-02 NOTE — Letter (Signed)
Jun 27, 2008    [name], MD  [address], [address]  [place], [place] [address]   RE:  Kaitlyn, Cooper  MRN:  [id number]  /  DOB:  March 20, 1926   Dear Lorin Picket:   Kaitlyn Cooper returns to the office for continued assessment and treatment  of mild coronary artery disease and symptoms of uncertain etiology.  Since her last visit, she has substantially improved.  Her appetite has  returned.  Her weight loss has stopped.  Over the past 48 hours, she  feels as if she is symptomatically back to normal.  Nonetheless, she  apparently has had a progressive anemia with hemoglobin now down below  10 on a recent determination in your office.  Her echocardiogram was  normal for age with perfectly normal left ventricular systolic function.  Her chest x-ray showed no acute abnormalities - there were scattered  granulomata and a component of hyperaeration by apical thickening.  She  carried an event recorder for 21 days.  No symptomatic episodes were  reported, and she verifies that she did not experience any symptoms that  she felt were worth reporting.  She did have substantial sinus  bradycardia with intermittent junctional rhythm and rates as low as the  low 30s.  She had intermittent atrial bigeminy and sinus arrhythmia.  A  single episode of SVT at a rate of 130 lasted only 4 seconds and was not  associated with symptoms.   MEDICATIONS:  Unchanged from her last visit.   PHYSICAL EXAMINATION:  GENERAL:  A very pleasant sharp older woman who  is quite hard of hearing and in no acute distress.  VITAL SIGNS:  The weight is 122, 1 pound increase from her last visit.  Blood pressure 110/60, heart rate 60 and somewhat irregular,  respirations 12 and unlabored.  NECK:  No jugular venous distention; normal carotid upstrokes without  bruits.  LUNGS:  Clear.  BACK:  Mild kyphosis.  CARDIAC:  Normal first and second heart sounds; modest systolic murmur;  heart rate irregularities with sudden  accelerations and decelerations  and multiple ectopics.  ABDOMEN:  Soft and nontender; no masses; no organomegaly.  EXTREMITIES:  No edema; distal pulses intact.   IMPRESSION:  Kaitlyn Cooper appears to have spontaneously recovered from  the symptoms with which she presented.  This could be attributable to a  systemic reaction to her recent episode of herpes zoster.  Alternatively, she may have developed anemia fairly acutely and required  a period to adjust.  Another possibility is that she has a significant,  but is yet undiagnosed disease underlying her anemia.  She is scheduled  to see Dr. Mariel Sleet for evaluation.  I will continue to see her to  follow her conduction system disease, which is currently asymptomatic.  Thanks so much for sending this very nice lady to me.    Sincerely,      Gerrit Friends. Dietrich Pates, MD, [facility]  Electronically Signed    RMR/MedQ  DD: 06/27/2008  DT: 06/28/2008  Job #: [id number]

## 2010-07-02 NOTE — Letter (Signed)
May 31, 2008    [name], MD  [address], [address]  [place], [place] [address]   RE:  Kaitlyn Cooper, Kaitlyn Cooper  MRN:  [id number]  /  DOB:  [birth year]   Dear Lorin Picket,   It was my pleasure evaluating Kaitlyn Cooper in the office today in  consultation at your request.  As you know, her husband is a patient of  mine.  Kaitlyn Cooper has been seen in our practice before.  She underwent  cardiac catheterization in 1998 showing mild coronary disease including  30-40% stenoses in the very proximal LAD, mid LAD, first diagonal  branch, and bifurcation of the circumflex between a large first marginal  and mid circumflex.  LV systolic function was normal.  She was  subsequently evaluated by Dr. Daleen Squibb in 2003.  Stress testing at that time  was negative.  Carotid ultrasound was performed in 2003, revealing minor  plaque without any significant obstruction.  She now presents with  nonspecific symptoms including malaise, exertional intolerance, perhaps  some dyspnea, and bradycardia.  Kaitlyn Cooper has enjoyed generally  excellent health.  She recently developed herpes zoster, involving the  right posterior trunk.  She subsequently was found to have an  asymptomatic urinary tract infection.  Her current symptoms are referred  by her and her sister to these episodes, but not definitely related to  them.  Since then, she has noted increased fatigue and weight loss.  Looking at her chart, she has dropped 20 pounds over the past decade.  Her sister notes a 6-pound weight loss over the past 2 weeks.  Ms.  Cooper also often feels too fatigued to do anything in the morning  after a shower or after some minor activity for personal care.  She has  had some dizziness, but this is difficult to characterize.  She does not  feel presyncopal nor does she clearly have vertigo.   PAST MEDICAL HISTORY:  Otherwise unremarkable.  She reports an allergy  to PENICILLIN.   CURRENT MEDICATIONS:  1. Levothyroxine 0.075 mg  daily.  2. Zoloft 100 mg daily.  3. Premarin 0.625 mg daily.  4. Ranitidine 150 mg b.i.d.  5. Mevacor 20 mg daily.   SOCIAL HISTORY:  Retired; married and lives locally; no use of tobacco  products nor alcohol.   FAMILY HISTORY:  Father died due to Parkinson's and mother due to senile  dementia.  Two sisters are alive and well.   REVIEW OF SYSTEMS:  Notable for occasional dizziness, prior cataract  surgery bilaterally, the need for corrective lenses, hearing loss; all  other systems reviewed and are negative.   BASIC LABORATORY DATA:  Notable for anemia with hemoglobin of 10.5 and a  normal MCV.  She has mild chronic kidney disease with a BUN of 25 and  creatinine of 1.6, said to be stable for many years.  Stool for  hemoccult testing was reportedly negative.  Lipid profile is fairly good  with total cholesterol of 196, triglycerides of 214, HDL of 53 and LDL  of 100.  TSH was normal.  EKG showed sinus bradycardia and minimal  nonspecific ST-segment abnormality.  An echocardiogram showed normal  left ventricular size and function.  No significant valvular  abnormalities were identified.   PHYSICAL EXAMINATION:  GENERAL:  Trim, pleasant woman who is obviously  hard of hearing and in no acute distress.  VITAL SIGNS:  The weight is 121 pounds.  Blood pressure 100/50, heart  rate 60, and somewhat  irregular.  Respirations 12 and unlabored.  O2  saturation 97% on room air.  Orthostatic vital signs were measured.  Blood pressure was 110/50 lying  and 90/50 standing without symptoms.  HEENT:  EOMs full; gray plaques over small areas of both sclerae,  pigmented lesion at the left base of the tongue.  NECK:  No jugular venous distention; normal carotid upstrokes without  bruits.  ENDOCRINE:  No thyromegaly.  HEMATOPOIETIC:  No adenopathy.  LUNGS:  Clear.  CARDIAC:  Normal first and second heart sounds; fourth heart sound  present.  ABDOMEN:  Soft and nontender; no organomegaly.   EXTREMITIES:  No edema; normal distal pulses.  NEUROLOGIC:  Symmetric strength and tone; normal cranial nerves.   A 6-minute walk test was performed with oximetry.  Heart rate did not  increased significantly.  The rhythm was sinus bradycardia with PACs at  a rate of 44.  Kaitlyn Cooper covered 1000 feet, which is quite good,  without dyspnea or dizziness.  The oximeter indicated a decrease in O2  saturation, perhaps as low as 84%; however, there was a question as to  whether the device was adequately measuring her pulse.   IMPRESSION:  Kaitlyn Cooper presents with nonspecific symptoms and anemia.  The anemia appears out of proportion to her chronic kidney disease and  provides [place] with the only clue to look for the cause of her illness.  I  believe you have already arranged for repeat CBC and an anemia profile.  Based upon the results of that, additional evaluation may be warranted.   She does have chronotropic incompetence and sinus bradycardia indicating  a component of conduction system disease; however, this is not clearly  accounting for symptoms.  She will wear an event recorder for 3 weeks to  further define the relationship between heart rate and her sense of well  being.  A chest x-ray will be obtained, as well as additional basic  blood tests.  I will plan to see this nice woman again in 1 month.  Thank you so much for allowing me to assist in the evaluation of her  current problems.    Sincerely,      Gerrit Friends. Dietrich Pates, MD, [facility]  Electronically Signed    RMR/MedQ  DD: 05/31/2008  DT: 06/01/2008  Job #: [id number]

## 2010-07-05 NOTE — Consult Note (Signed)
NAME:  Kaitlyn Cooper, Kaitlyn Cooper                         ACCOUNT NO.:  [id number]   MEDICAL RECORD NO.:  [id number]                  PATIENT TYPE:   LOCATION:                                       FACILITY:   PHYSICIAN:  Lionel December, M.D.                 DATE OF BIRTH:  1926/12/01   DATE OF CONSULTATION:  02/14/2002  DATE OF DISCHARGE:                                   CONSULTATION   REASON FOR CONSULTATION:  History of colonic polyps.  The patient has  chronic/recurrent diarrhea.   HISTORY OF PRESENT ILLNESS:  The patient is a 77 year old Caucasian female  who was referred through the courtesy of [name], M.D. for GI  evaluation.  She has history of colonic polyps.  Her last colonoscopy was in  July 1995 and she had two polyps removed.  One polyp was about 7 cm and was  a tubular adenoma.  Other polyp was inflammatory.  Follow-up examination was  advised but she has been afraid because of the preparation.  She has not  experienced any rectal bleeding but she has diarrhea a couple of times a  week.  She recalls that she has had frequent bowel movements even when she  was in her 20s.  Some days she may have as many as four to five bowel  movements.  Rarely, she may have more stools.  On days like this she would  use OTC Imodium.  She has good appetite and has not lost any weight.  She  has history of heartburn which is well controlled with diet and single dose  of Zantac.   MEDICATIONS:  1. She is on Synthroid 88 mcg q.d.  2. Zoloft 100 mg q.d.  3. Premarin q.d. dose unknown.  4. ASA half tablet q.a.m.  5. Lasix 10 mg q.d.  6. Vitamin D, E q.d.  7. Fosamax 70 mg each week.  8. Lopressor 25 mg b.i.d.  9. Zantac 150 mg q.h.s.   PAST MEDICAL HISTORY:  1. She has hypothyroidism.  2. She either has osteopenia or osteoporosis.  3. Depression.  4. Chronic GERD.  She had an EGD at the time of laparoscopic cholecystectomy     less than five years ago.  History of colonic polyps  as above.  5. She has had fleeting chest pain and is felt to have angina but the pain     never lasts more than a few seconds.  She had cardiac catheterization     several years ago with insignificant findings.  Since then she has had     other noninvasive studies.  She had cholecystectomy four or five years     ago for symptomatic cholelithiasis.   ALLERGIES:  PENICILLIN which caused exfoliative rash.   FAMILY HISTORY:  Both parents are deceased.  Father had Parkinson's disease  and died at age 20.  Mother has colonic polyps removed  at age 45 which had  invasive carcinoma and she subsequently had segmental resection.  She lived  to be 92.  She also had dementia.  The patient has two sisters who are in  good health.   SOCIAL HISTORY:  She is married, but does not have any children.  She worked  at [employer] for 15 years.  She smoked until about 10 years ago.  She does not  drink alcohol.   PHYSICAL EXAMINATION:  GENERAL:  Pleasant, well-developed, well-nourished  Caucasian female who is in no acute distress.  VITAL SIGNS:  Weight 139 pounds, height 5 feet 4 inches, pulse 62 per  minute, blood pressure 100/58.  She is afebrile.  HEENT:  Conjunctivae are pink.  Sclerae are nonicteric.  Oropharyngeal  mucosa is normal.  NECK:  Without masses or thyromegaly.  CARDIAC:  Regular rhythm, normal S1 and S2.  No murmur or gallop noted.  LUNGS:  Clear to auscultation.  ABDOMEN:  Symmetrical.  Bowel sounds are normal.  Palpation reveals soft  abdomen without tenderness, organomegaly, or masses.  EXTREMITIES:  She does not have clubbing or peripheral edema.   ASSESSMENT:  The patient is a 77 year old Caucasian female with chronic  _________ and nonbloody diarrhea.  Symptoms are felt to be typical of  irritable bowel syndrome.  However, she has never been treated with bulk-  forming agent or antispasmodics.   She has history of colonic adenoma and family history is significant for  colonic  carcinoma (___________ of carcinoma) in a mother age 51.  She  therefore needs to have her colon surveyed.   Given history of chronic diarrhea, also need to make sure that we are not  dealing with microscopic colitis.   RECOMMENDATIONS:  1. Total colonoscopy to be scheduled in near future.  We will give her quick     prep.  2. In the meantime will try her on Citrucel one tablespoon daily.  3. Further recommendations will be made at the time of GCS.   I would like to thank [name], M.D. for the opportunity to  participate in the care of this nice lady.                                               Lionel December, M.D.    NR/MEDQ  D:  02/14/2002  T:  02/14/2002  Job:  [id number]   cc:   Lorin Picket A. Gerda Diss, M.D.  [address], [address]  [place]  [place] [address]  Fax: [phone]   [facility]

## 2010-07-05 NOTE — Op Note (Signed)
   NAME:  Kaitlyn Cooper, Kaitlyn Cooper                         ACCOUNT NO.:  [id number]   MEDICAL RECORD NO.:  [id number]                   PATIENT TYPE:  AMB   LOCATION:  DAY                                  FACILITY:  [facility]   PHYSICIAN:  Jerolyn Shin C. Katrinka Blazing, M.D.                DATE OF BIRTH:  06/22/1926   DATE OF PROCEDURE:  08/09/2002  DATE OF DISCHARGE:                                 OPERATIVE REPORT   PREOPERATIVE DIAGNOSIS:  Carcinoma of right temporal area.   POSTOPERATIVE DIAGNOSIS:  Carcinoma of right temporal area.   PROCEDURE:  Wide excision of carcinoma of right temporal area, 1.5 cm.   SURGEON:  Dirk Dress. Katrinka Blazing, M.D.   DESCRIPTION OF PROCEDURE:  The procedure was done in the minor procedure  room in day surgery.  The patient was prepped and draped in a sterile field.  Local infiltration with 1% Xylocaine was carried out.   A circumferential excision of the mass, including subcutaneous tissue, was  carried out without difficulty.  Hemostasis was achieved with the  electrocautery.  The wound was closed with 4-0 chromic in the subcutaneous  plane and 4-0 Prolene on the skin.  The patient tolerated the procedure  well.  A sterile bandage was placed.  She was transferred to a bed and taken  to the day surgery area for monitoring prior to being discharged home.                                               Dirk Dress. Katrinka Blazing, M.D.    LCS/MEDQ  D:  08/09/2002  T:  08/09/2002  Job:  [id number]

## 2010-07-05 NOTE — Op Note (Signed)
NAME:  Kaitlyn Cooper, DELANGEL                         ACCOUNT NO.:  [id number]   MEDICAL RECORD NO.:  [id number]                   PATIENT TYPE:  AMB   LOCATION:  DAY                                  FACILITY:  [facility]   PHYSICIAN:  Lionel December, M.D.                 DATE OF BIRTH:  [birth year]   DATE OF PROCEDURE:  02/21/2002  DATE OF DISCHARGE:                                 OPERATIVE REPORT   PROCEDURE:  Total colonoscopy.   INDICATIONS:  The patient is a 77 year old Caucasian female with a personal  history of colonic polyps, a family history of colon carcinoma, who also has  chronic/intermittent nonbloody diarrhea possibly due to IBS.  She is  undergoing surveillance/diagnostic colonoscopy.  The procedure risks were  reviewed with the patient and informed consent was obtained.   PREMEDICATION:  Demerol 30 mg IV, Versed 3 mg IV in divided dose.   INSTRUMENT USED:  Olympus video system.   FINDINGS:  Procedure performed in endoscopy suite.  The patient's vital  signs and O2 saturation were monitored during the procedure and remained  stable.  The patient was placed in the left lateral recumbent position and  rectal examination performed.  No abnormality noted on external or digital  exam.  The scope was placed in the rectum and advanced under vision in the  sigmoid colon and beyond.  A relatively small-caliber sigmoid colon which  was tortuous.  Using abdominal pressure, the scope was passed to the splenic  flexure and further into the cecum was achieved.  Preparation was excellent.  The cecum was identified by appendiceal orifice and ileocecal valve.  Short  segment of TI was also examined and was normal.  As the scope was withdrawn,  colonic mucosa was once again carefully examined.  A few diverticula were  noted at the sigmoid colon.  There were no mucosal abnormalities to suggest  colitis.  The rectal mucosa was normal.  The scope was retroflexed and  examined.  The anorectal  junction was unremarkable.  The endoscope was  straightened and withdrawn.  The patient tolerated the procedure well.   FINAL DIAGNOSES:  1. Few diverticula at sigmoid colon, otherwise normal colonoscopy and     terminal ileoscopy.  2. Suspect her chronic intermittent diarrhea is secondary to irritable bowel     syndrome.   RECOMMENDATIONS:  1. A high-fiber diet.  2. Citrucel one tablespoonful daily, and will try her on Bentyl 10 mg p.o.     q.a.m.  If necessary,  she can increase her     dose to b.i.d.  3. She should consider having next colonoscopy in five years from now.  If     her diarrhea does not improve with these measures, she will give [place] a     call.  Lionel December, M.D.    NR/MEDQ  D:  02/21/2002  T:  02/21/2002  Job:  [id number]   cc:   Lorin Picket A. Gerda Diss, M.D.  [address]  [place]  [place] [address]  Fax: [phone]

## 2010-07-05 NOTE — H&P (Signed)
   NAME:  Kaitlyn Cooper, Kaitlyn Cooper                         ACCOUNT NO.:  [id number]   MEDICAL RECORD NO.:  [id number]                   PATIENT TYPE:   LOCATION:                                       FACILITY:  [facility]   PHYSICIAN:  Dirk Dress. Katrinka Blazing, M.D.                DATE OF BIRTH:  [birth year]   DATE OF ADMISSION:  DATE OF DISCHARGE:                                HISTORY & PHYSICAL   HISTORY OF PRESENT ILLNESS:  [age]-year-old with history of  documented skin cancer of the right temporal area.  The patient had biopsy  done by a dermatologist about two weeks ago.  The specimen showed primary  cancer.  I have not been able to obtain the pathology report.  Reportedly,  it was done in [place] and the pathology was done by a private group.  Nevertheless, the patient was told that the area needs to be widely excised  and is scheduled for wide excision in the minor procedure room.   PAST HISTORY:  She has relatively good health.  She has hypothyroidism, mild  depression and a prior history of atherosclerotic heart disease, though she  is asymptomatic from a cardiovascular standpoint.   MEDICATIONS:  1. Premarin 0.625 mg daily.  2. Synthroid.  3. Zoloft.  4. Lopressor.  5. Lasix 10 mg daily.  6. Tylenol p.r.n.  7. Vitamin E.   She does not know the exact doses as she did not bring her medications to  the office.   ALLERGIES:  PENICILLIN.   PHYSICAL EXAMINATION:  GENERAL:  On examination, she is a healthy-appearing  elderly female in no acute distress.  VITAL SIGNS:  Blood pressure 120/70, pulse 68, respirations 18.  Weight 139  pounds.  HEENT:  HEENT is unremarkable except she has a 1-cm lesion in the right  frontotemporal area with raw margins.  The area is slightly raised and  nontender.  No other facial lesions are noted.  NECK:  Neck supple.  No JVD or bruits.  CHEST:  Chest clear to auscultation.  HEART:  Regular rate and rhythm.  No murmur, gallop or rub.  ABDOMEN:   Abdomen soft and nontender.  No masses.  EXTREMITIES:  Unremarkable.  No acute changes.    IMPRESSION:  Primary skin cancer, right temporal area.   PLAN:  Scheduled for removal in minor procedure room in day surgery.                                               Dirk Dress. Katrinka Blazing, M.D.    LCS/MEDQ  D:  08/08/2002  T:  08/09/2002  Job:  [id number]   cc:   Lorin Picket A. Gerda Diss, M.D.  [address], [address]  [place]  [place] [address]  Fax: [phone]

## 2010-10-28 ENCOUNTER — Encounter (HOSPITAL_COMMUNITY): Payer: Self-pay | Admitting: Oncology

## 2010-10-28 ENCOUNTER — Encounter (HOSPITAL_COMMUNITY): Payer: Medicare Other | Attending: Oncology | Admitting: Oncology

## 2010-10-28 VITALS — BP 97/58 | HR 86 | Temp 97.9°F | Wt 112.8 lb

## 2010-10-28 DIAGNOSIS — E538 Deficiency of other specified B group vitamins: Secondary | ICD-10-CM | POA: Insufficient documentation

## 2010-10-28 DIAGNOSIS — R5381 Other malaise: Secondary | ICD-10-CM | POA: Insufficient documentation

## 2010-10-28 DIAGNOSIS — E039 Hypothyroidism, unspecified: Secondary | ICD-10-CM | POA: Insufficient documentation

## 2010-10-28 DIAGNOSIS — D649 Anemia, unspecified: Secondary | ICD-10-CM | POA: Insufficient documentation

## 2010-10-28 DIAGNOSIS — R5383 Other fatigue: Secondary | ICD-10-CM | POA: Insufficient documentation

## 2010-10-28 DIAGNOSIS — Z79899 Other long term (current) drug therapy: Secondary | ICD-10-CM | POA: Insufficient documentation

## 2010-10-28 DIAGNOSIS — D638 Anemia in other chronic diseases classified elsewhere: Secondary | ICD-10-CM

## 2010-10-28 LAB — COMPREHENSIVE METABOLIC PANEL
ALT: 6 U/L (ref 0–35)
AST: 15 U/L (ref 0–37)
Alkaline Phosphatase: 67 U/L (ref 39–117)
CO2: 25 mEq/L (ref 19–32)
GFR calc Af Amer: 49 mL/min — ABNORMAL LOW (ref >60)
GFR calc non Af Amer: 41 mL/min — ABNORMAL LOW (ref >60)
Glucose, Bld: 88 mg/dL (ref 70–99)
Potassium: 4.5 mEq/L (ref 3.5–5.1)
Sodium: 138 mEq/L (ref 135–145)
Total Protein: 8 g/dL (ref 6.0–8.3)

## 2010-10-28 LAB — DIFFERENTIAL
Eosinophils Relative: 2 % (ref 0–5)
Lymphocytes Relative: 26 % (ref 12–46)
Lymphs Abs: 2.6 10*3/uL (ref 0.7–4.0)
Monocytes Absolute: 0.9 10*3/uL (ref 0.1–1.0)

## 2010-10-28 LAB — CBC
HCT: 31.5 % — ABNORMAL LOW (ref 36.0–46.0)
Hemoglobin: 10.4 g/dL — ABNORMAL LOW (ref 12.0–15.0)
MCV: 94 fL (ref 78.0–100.0)
RBC: 3.35 MIL/uL — ABNORMAL LOW (ref 3.87–5.11)
WBC: 10.1 10*3/uL (ref 4.0–10.5)

## 2010-10-28 NOTE — Patient Instructions (Signed)
[facility]  Discharge Instructions  RECOMMENDATIONS MADE BY THE CONSULTANT AND ANY TEST RESULTS WILL BE SENT TO YOUR REFERRING DOCTOR.   EXAM FINDINGS BY MD TODAY AND SIGNS AND SYMPTOMS TO REPORT TO CLINIC OR PRIMARY MD:   Please return to Dr. Mariel Sleet in 2 weeks.   I acknowledge that I have been informed and understand all the instructions given to me and received a copy. I do not have any more questions at this time, but understand that I may call the Specialty Clinic at [facility] at [phone] during business hours should I have any further questions or need assistance in obtaining follow-up care.    __________________________________________  _____________  __________ Signature of Patient or Authorized Representative            Date                   Time    __________________________________________ Nurse's Signature

## 2010-10-28 NOTE — Progress Notes (Signed)
Kaitlyn Punt, MD, MD [address] [place] [address]  1. ANEMIA  Vitamin B12, Folate, Iron and TIBC, Ferritin, CBC, Differential, Reticulocytes, Comprehensive metabolic panel, Sedimentation rate, C-reactive protein, IgG, IgA, IgM, Immunofixation electrophoresis, Protein electrophoresis, serum     INTERVAL HISTORY: Kaitlyn Cooper 77 y.o. female returns for  regular  visit for followup of anemia of chronic disease.  The patient states that she has been getting more fatigued and "run-down" lately.  She reports that she cancelled her follow-up appointment with Dr. Mariel Sleet because it became too difficult for her to complete lab work at the clinic and at her PCPs office and see two physicians.   The patient explains that she was receiving B12 injections by Dr. Gerda Diss.  She reports that she felt better for a short while and then notes that she began feeling more and more tired.  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 She gets tired after showering.  She believes she spends less than 50% of the daylight hours sitting in a recliner.  She reports that she tries to remain active.  She denies any active bleeding including, epistaxis, hematuria, gingival bleeding, blood in stool, black tarry stool, and vaginal spotting.  Past Medical History  Diagnosis Date  . Anemia     followed by Dr.Neijstrom  . Hyperlipidemia   . Depression   . Hypothyroidism   . History of atherosclerotic cardiovascular disease   . History of colonoscopy     02/2002 showed few diverticula at sigmoid colon otherwise normal colonoscopy and terminal ileoscopy 1995 several colon polyps ,tubular adenoma  . GERD (gastroesophageal reflux disease)     chronic  . GERD (gastroesophageal reflux disease)     chronic gerd  . Tiredness     has COLONIC POLYPS, ADENOMATOUS; HYPOTHYROIDISM; HYPERLIPIDEMIA; ANEMIA; DEPRESSION; ESSENTIAL HYPERTENSION, BENIGN; BRADYCARDIA;  ATHEROSCLEROTIC CARDIOVASCULAR DISEASE; GASTROESOPHAGEAL REFLUX DISEASE, CHRONIC; OSTEOPOROSIS; OSTEOPENIA; DYSPHAGIA; DIARRHEA, CHRONIC; DEPRESSION, HX OF; COLONIC POLYPS, HX OF; and CARDIAC PACEMAKER IN SITU on her problem list.     is allergic to penicillins.  Kaitlyn Cooper does not currently have medications on file.  Past Surgical History  Procedure Date  . Insert / replace / remove pacemaker 01/17/2009    [employer] Cardiac Pacemaker   . Cardiac catheterization 1998  . Cholecystectomy   . Temporal carcinoma 2004    excision of right temporal carcinoma     Denies any headaches, dizziness, double vision, fevers, chills, night sweats, nausea, vomiting, diarrhea, constipation, chest pain, heart palpitations, shortness of breath, blood in stool, black tarry stool, urinary pain, urinary burning, urinary frequency, hematuria.   PHYSICAL EXAMINATION  ECOG PERFORMANCE STATUS: 1 - Symptomatic but completely ambulatory  Filed Vitals:   10/28/10 1351  BP: 97/58  Pulse: 86  Temp: 97.9 F (36.6 C)    GENERAL:alert, no distress, well nourished, well developed, comfortable, cooperative and smiling SKIN: skin color, texture, turgor are normal HEAD: Normocephalic EYES: normal EARS: External ears normal OROPHARYNX:mucous membranes are moist  NECK: supple, no adenopathy, no bruits, no JVD, thyroid normal size, non-tender, without nodularity, no stridor, non-tender, trachea midline LYMPH:  no palpable lymphadenopathy, no hepatosplenomegaly BREAST:not examined LUNGS: clear to auscultation and percussion HEART: regular rate & rhythm, no murmurs, no gallops, S1 normal and S2 normal ABDOMEN:abdomen soft, non-tender, normal bowel sounds and no hepatosplenomegaly BACK:No CVA tenderness EXTREMITIES:less then 2 second capillary refill, no joint deformities, effusion, or inflammation, no edema, no skin discoloration, no clubbing, no cyanosis  NEURO: alert & oriented x 3 with fluent speech, no focal  motor/sensory deficits, gait normal    PENDING LABS: CBC diff, CMET, retic count, ESR, CRP, Anemia panel, SPEP with quantitative IFE    ASSESSMENT:  1. Anemia of chronic disease 2. H/O nonspecific polyclonal IgA elevation in past 3. Fatigue and tiredness   PLAN:  1. Lab work today: CBC diff, CMET, retic count, ESR, CRP, Anemia panel, SPEP with quantitative IFE 2. Return in 1-2 weeks for follow-up. 3. I personally reviewed and went over laboratory results with the patient.   All questions were answered. The patient knows to call the clinic with any problems, questions or concerns. We can certainly see the patient much sooner if necessary.  The patient and plan discussed with Arlan Organ, MD and he is in agreement with the aforementioned.   Kaitlyn Cooper

## 2010-10-29 ENCOUNTER — Telehealth (HOSPITAL_COMMUNITY): Payer: Self-pay

## 2010-10-29 LAB — IGG, IGA, IGM: IgA: 690 mg/dL — ABNORMAL HIGH (ref 69–380)

## 2010-10-29 LAB — VITAMIN B12: Vitamin B-12: 596 pg/mL (ref 211–911)

## 2010-10-29 NOTE — Telephone Encounter (Signed)
Instructed to increase fluids per request of Jenita Seashore, [place]

## 2010-10-29 NOTE — Telephone Encounter (Deleted)
Patient instructed to increase fluid intake.  Rilynne, [place] - 10/28/10 More Detail >>      Dellis Anes, [place]        Sent: Tue October 29, 2010 11:18 AM    To: Filiberto Pinks Nurse Ap          Result Note     Dehydrated.  Push PO fluids     Attached to     DIFFERENTIAL (Order# [id number]); CBC (Order# [id number]); RETICULOCYTES (Order# [id number]); COMPREHENSIVE METABOLIC PANEL (Order# [id number]); SEDIMENTATION RATE (Order# [id number]); IRON AND TIBC (Order# [id number]); C-REACTIVE PROTEIN (Order# [id number]); VITAMIN B12 (Order# [id number]); FOLATE (Order# [id number]); FERRITIN (Order# [id number]); IGG, IGA, IGM (Order# [id number])         Vitamin B12      Status: Final result   MyChart: Not Released   Next appt with me: None   Dx: ANEMIA        Notes Recorded by Dellis Anes, PA on 10/29/2010 at 11:18 AM Dehydrated.  Push PO fluids        Value Range      Vitamin B-12 596 211 - 911 pg/mL    Resulting Agency [facility]      Lab Flowsheet    Order Details View Encounter Lab and Collection Details Routing Result History      Specimen Collected: 10/28/10  2:54 PM Last Resulted: 10/29/10  4:22 AM            Folate      Status: Final result   MyChart: Not Released   Next appt with me: None   Dx: ANEMIA        Notes Recorded by Dellis Anes, PA on 10/29/2010 at 11:18 AM Dehydrated.  Push PO fluids        Range    1d    10yr        Folate ng/mL 12.7    >20.0 (NOT...      Comments:        (NOTE) Reference Ranges        Deficient:       0.4 - 3.3 ng/mL        Indeterminate:   3.4 - 5.4 ng/mL        Normal:              > 5.4 ng/mL        Resulting Agency          Lab Flowsheet    Order Details View Encounter Lab and Collection Details Routing Result History      Specimen Collected: 10/28/10  2:54 PM Last Resulted: 10/29/10  4:22 AM            Iron and TIBC      Status: Final result   MyChart: Not Released   Next appt with me: None   Dx: ANEMIA        Notes Recorded by Dellis Anes, PA on 10/29/2010 at 11:18 AM Dehydrated.  Push PO fluids        Range    1d    68yr    4yr        Iron 42 - 135 ug/dL 89    62   83       TIBC 250 - 470 ug/dL 573    220   254       Saturation Ratios 20 - 55 % 22    17 (L)   21  UIBC 125 - 400 ug/dL 161    096E   454U      Resulting Agency            Lab Flowsheet    Order Details View Encounter Lab and Collection Details Routing Result History      Specimen Collected: 10/28/10  2:54 PM Last Resulted: 10/29/10  4:11 AM        R=Reference range differs from displayed range          Ferritin      Status: Final result   MyChart: Not Released   Next appt with me: None   Dx: ANEMIA        Notes Recorded by Dellis Anes, PA on 10/29/2010 at 11:18 AM Dehydrated.  Push PO fluids        Range    1d (10/28/10)    109yr (02/06/09)    18yr (12/14/08)    67yr (07/07/08)        Ferritin 10 - 291 ng/mL 116    396 (H)   27   26      Resulting Agency              Lab Flowsheet    Order Details View Encounter Lab and Collection Details Routing Result History      Specimen Collected: 10/28/10  2:54 PM Last Resulted: 10/29/10  4:22 AM            CBC      Status: Final result   MyChart: Not Released   Next appt with me: None   Dx: ANEMIA        Notes Recorded by Dellis Anes, PA on 10/29/2010 at 11:18 AM Dehydrated.  Push PO fluids        Range    1d (10/28/10)    62yr (07/10/09)    57yr (02/06/09)    74yr (01/18/09)    45yr (12/14/08)    74yr (09/28/08)    68yr (08/29/08)        WBC 4.0 - 10.5 K/uL 10.1    8.5   8.1   8.3R   9.0   7.8   7.8       RBC 3.87 - 5.11 MIL/uL 3.35 (L)    3.45 (L)   3.57 (L)   3.29 (L)R   3.34 (L)   3.39 (L)   3.46 (L)       Hemoglobin 12.0 - 15.0 g/dL 98.1 (L)    19.1 (L)   11.1 (L)   10.1 (L)R   10.3 (L)   10.7 (L)   10.9 (L)       HCT 36.0 - 46.0 % 31.5 (L)    31.3 (L)   33.0 (L)   30.3 (L)R   30.5 (L)   30.7 (L)   31.7 (L)       MCV 78.0 - 100.0 fL 94.0    90.5   92.5   92.1R    91.3   90.7   91.7       MCH 26.0 - 34.0 pg 31.0                          MCHC 30.0 - 36.0 g/dL 47.8    29.5   62.1   30.8M   33.8   34.8   34.4       RDW 11.5 - 15.5 % 13.5    13.3   14.7  13.9R   13.7   13.3   14.0       Platelets 150 - 400 K/uL 292    263   239   248R   249   261   262      Resulting Agency                    Lab Flowsheet    Order Details View Encounter Lab and Collection Details Routing Result History      Specimen Collected: 10/28/10  2:54 PM Last Resulted: 10/28/10  6:08 PM        R=Reference range differs from displayed range          Differential      Status: Final result   MyChart: Not Released   Next appt with me: None   Dx: ANEMIA        Notes Recorded by Dellis Anes, PA on 10/29/2010 at 11:18 AM Dehydrated.  Push PO fluids        Range    1d (10/28/10)    60yr (01/18/09)    47yr (12/14/08)    70yr (09/28/08)    30yr (08/29/08)    72yr (08/03/08)    63yr (07/07/08)        Neutrophils Relative 43 - 77 % 63    60R   61   59   57   58   56       Neutro Abs 1.7 - 7.7 K/uL 6.3    5.0R   5.5   4.6   4.4   4.9   4.5       Lymphocytes Relative 12 - 46 % 26    28R   27   29   31   31    32       Lymphs Abs 0.7 - 4.0 K/uL 2.6    2.3R   2.4   2.3   2.4   2.6   2.6       Monocytes Relative 3 - 12 % 9    9R   8   8   9   8   10        Monocytes Absolute 0.1 - 1.0 K/uL 0.9    0.8R   0.7   0.6   0.7   0.7   0.8       Eosinophils Relative 0 - 5 % 2    3R   3   3   3   3   2        Eosinophils Absolute 0.0 - 0.7 K/uL 0.2    0.2R   0.2   0.3   0.2   0.2   0.2       Basophils Relative 0 - 1 % 0    0R   1   1   1    0   1       Basophils Absolute 0.0 - 0.1 K/uL 0.0    0.0R   0.1   0.1   0.0   0.0   0.0      Resulting Agency                    Lab Flowsheet    Order Details View Encounter Lab and Collection Details Routing Result History      Specimen Collected: 10/28/10  2:54 PM Last Resulted: 10/28/10  6:08 PM  R=Reference range differs from  displayed range          Reticulocytes      Status: Final result   MyChart: Not Released   Next appt with me: None   Dx: ANEMIA        Notes Recorded by Dellis Anes, PA on 10/29/2010 at 11:18 AM Dehydrated.  Push PO fluids        Range    1d (10/28/10)    19yr (09/28/08)    46yr (07/07/08)        Retic Ct Pct 0.4 - 3.1 % 1.2    1.0   1.3       RBC. 3.87 - 5.11 MIL/uL 3.35 (L)    3.39 (L)   3.19 (L)       Retic Count, Manual 19.0 - 186.0 K/uL 40.2    33.9   41.5      Resulting Agency            Lab Flowsheet    Order [employer] Encounter Lab and Collection Details Routing Result History      Specimen Collected: 10/28/10  2:54 PM Last Resulted: 10/28/10  6:08 PM            Comprehensive metabolic panel      Status: Final result   MyChart: Not Released   Next appt with me: None   Dx: ANEMIA        Notes Recorded by Dellis Anes, PA on 10/29/2010 at 11:18 AM Dehydrated.  Push PO fluids        Range    1d (10/28/10)    35yr (01/18/09)    61yr (08/29/08)    2yr (08/29/08)    2yr (07/07/08)    36yr (07/07/08)    20yr (07/07/08)        Sodium 135 - 145 mEq/L 138    137R      141         139       Potassium 3.5 - 5.1 mEq/L 4.5    3.8R      4.3         4.2       Chloride 96 - 112 mEq/L 102    105R      107         108       CO2 19 - 32 mEq/L 25    26R      23         24       Glucose, Bld 70 - 99 mg/dL 88    409 (H)R      811 (H)         82       BUN 6 - 23 mg/dL 30 (H)    91Y      23         23       Creatinine, Ser 0.50 - 1.10 mg/dL 7.82 (H)    9.56O   1.30Q   1.19R         0.94R       Calcium 8.4 - 10.5 mg/dL 65.7    8.4O      9.3         9.3       Total Protein 6.0 - 8.3 g/dL 8.0             7.6   7.5   7.2  Albumin 3.5 - 5.2 g/dL 3.6                84.1 (L)R   3.4 (L)       AST 0 - 37 U/L 15                   20       ALT 0 - 35 U/L 6                   9       Alkaline Phosphatase 39 - 117 U/L 67                   63       Total Bilirubin 0.3 - 1.2 mg/dL  0.1 (L)                   0.4       GFR calc non Af Amer >60 mL/min 41 (L)          44 (L)         57 (L)       GFR calc Af Amer >60 mL/min 49 (L)          53 ... (L)         >60 ...      Comments:               The eGFR has been calculated using the MDRD equation. This calculation has not been validated in all clinical situations. eGFR's persistently <60 mL/min signify possible Chronic Kidney Disease.        Resulting Agency                    Lab Flowsheet    Order Details View Encounter Lab and Collection Details Routing Result History      Specimen Collected: 10/28/10  2:54 PM Last Resulted: 10/28/10  6:38 PM        R=Reference range differs from displayed range          Sedimentation rate      Status: Final result   MyChart: Not Released   Next appt with me: None   Dx: ANEMIA        Notes Recorded by Dellis Anes, PA on 10/29/2010 at 11:18 AM Dehydrated.  Push PO fluids        Range    1d (10/28/10)    78yr (07/10/09)    16yr (02/06/09)    13yr (12/14/08)    55yr (09/28/08)    57yr (08/29/08)    73yr (07/07/08)        Sed Rate 0 - 22 mm/hr 61 (H)    68 (H)   70 (H)   68 (H)   60 (H)   64 (H)   80 (H)      Resulting Agency                    Lab Flowsheet    Order Details View Encounter Lab and Collection Details Routing Result History      Specimen Collected: 10/28/10  2:54 PM Last Resulted: 10/28/10  7:05 PM            C-reactive protein      Status: Final result   MyChart: Not Released   Next appt with me: None   Dx: ANEMIA        Notes Recorded by Dellis Anes, PA on  10/29/2010 at 11:18 AM Dehydrated.  Push PO fluids        Range    1d    29yr    21yr        CRP <0.60 mg/dL 1.61 (H)    0.6 (H)R   0.8 (H)R      Resulting Agency            Lab Flowsheet    Order Details View Encounter Lab and Collection Details Routing Result History      Specimen Collected: 10/28/10  2:54 PM Last Resulted: 10/29/10  4:11 AM        R=Reference range  differs from displayed range          IgG, IgA, IgM      Status: Final result   MyChart: Not Released   Next appt with me: None   Dx: ANEMIA        Notes Recorded by Dellis Anes, PA on 10/29/2010 at 11:18 AM Dehydrated.  Push PO fluids        Range    1d    13yr        IgG (Immunoglobin G), Serum 690 - 1700 mg/dL 0960    4540J       IgA 69 - 380 mg/dL 811 (H)    914 (H)R       IgM, Serum 52 - 322 mg/dL 782    956O      Resulting Agency          Lab Flowsheet    Order Details View Encounter Lab and Collection Details Routing Result History      Specimen Collected: 10/28/10  2:54 PM Last Resulted: 10/29/10  9:59 AM        R=Reference range differs from displayed range             Status of Other Orders           Lab Status Result Date Provider Status      Immunofixation electrophoresis  In process 10/28/2010 Ordered    Protein electrophoresis, serum  In process 10/28/2010 Ordered            Future Expected on:          Vitamin B12  10/28/2010       Folate  10/28/2010       Iron and TIBC  10/28/2010       Ferritin  10/28/2010       CBC  10/28/2010       Differential  10/28/2010       Reticulocytes  10/28/2010       Comprehensive metabolic panel  10/28/2010       Sedimentation rate  10/28/2010       C-reactive protein  10/28/2010       IgG, IgA, IgM  10/28/2010       Immunofixation electrophoresis  10/28/2010       Protein electrophoresis, serum  10/28/2010

## 2010-10-29 NOTE — Telephone Encounter (Signed)
Pati

## 2010-10-30 LAB — IMMUNOFIXATION ELECTROPHORESIS
IgA: 744 mg/dL — ABNORMAL HIGH (ref 68–380)
IgG (Immunoglobin G), Serum: 1290 mg/dL (ref 690–1700)
Total Protein ELP: 8.4 g/dL — ABNORMAL HIGH (ref 6.0–8.3)

## 2010-10-30 LAB — PROTEIN ELECTROPHORESIS, SERUM
Beta Globulin: 7.5 % — ABNORMAL HIGH (ref 4.7–7.2)
Gamma Globulin: 15.9 % (ref 11.1–18.8)
M-Spike, %: NOT DETECTED g/dL
Total Protein ELP: 7.9 g/dL (ref 6.0–8.3)

## 2010-11-13 ENCOUNTER — Encounter (HOSPITAL_BASED_OUTPATIENT_CLINIC_OR_DEPARTMENT_OTHER): Payer: Medicare Other | Admitting: Oncology

## 2010-11-13 VITALS — BP 120/66 | HR 91 | Temp 97.3°F | Ht 63.0 in | Wt 117.8 lb

## 2010-11-13 DIAGNOSIS — D638 Anemia in other chronic diseases classified elsewhere: Secondary | ICD-10-CM

## 2010-11-13 DIAGNOSIS — D649 Anemia, unspecified: Secondary | ICD-10-CM

## 2010-11-13 MED ORDER — PREDNISONE 10 MG PO TABS: 10.0000 mg | ORAL_TABLET | Freq: Two times a day (BID) | ORAL | Status: AC

## 2010-11-13 NOTE — Progress Notes (Signed)
This office note has been dictated.

## 2010-11-13 NOTE — Patient Instructions (Addendum)
[facility]  Discharge Instructions  RECOMMENDATIONS MADE BY THE CONSULTANT AND ANY TEST RESULTS WILL BE SENT TO YOUR REFERRING DOCTOR.   EXAM FINDINGS BY MD TODAY AND SIGNS AND SYMPTOMS TO REPORT TO CLINIC OR PRIMARY MD: Doing well  MEDICATIONS PRESCRIBED: Prednisone 10 mg twice daily.  # 60 with no refills, e-prescribed to [facility]    SPECIAL INSTRUCTIONS/FOLLOW-UP: Lab work Needed around October 29 and see MD a few days later.   I acknowledge that I have been informed and understand all the instructions given to me and received a copy. I do not have any more questions at this time, but understand that I may call the Specialty Clinic at [facility] at [phone] during business hours should I have any further questions or need assistance in obtaining follow-up care.    __________________________________________  _____________  __________ Signature of Patient or Authorized Representative            Date                   Time    __________________________________________ Nurse's Signature

## 2010-12-16 ENCOUNTER — Encounter (HOSPITAL_COMMUNITY): Payer: Medicare Other | Attending: Oncology

## 2010-12-16 DIAGNOSIS — D649 Anemia, unspecified: Secondary | ICD-10-CM | POA: Insufficient documentation

## 2010-12-16 LAB — CBC
MCH: 30.9 pg (ref 26.0–34.0)
MCHC: 32.3 g/dL (ref 30.0–36.0)
Platelets: 243 10*3/uL (ref 150–400)
RBC: 4.01 MIL/uL (ref 3.87–5.11)
RDW: 14.5 % (ref 11.5–15.5)

## 2010-12-16 LAB — SEDIMENTATION RATE: Sed Rate: 5 mm/hr (ref 0–22)

## 2010-12-16 NOTE — Progress Notes (Signed)
Labs drawn today for crp, cbc,esr

## 2010-12-17 LAB — C-REACTIVE PROTEIN: CRP: 0.93 mg/dL — ABNORMAL HIGH (ref <0.60)

## 2010-12-18 ENCOUNTER — Encounter (HOSPITAL_COMMUNITY): Payer: Self-pay | Admitting: Oncology

## 2010-12-18 ENCOUNTER — Encounter (HOSPITAL_BASED_OUTPATIENT_CLINIC_OR_DEPARTMENT_OTHER): Payer: Medicare Other | Admitting: Oncology

## 2010-12-18 VITALS — BP 121/56 | HR 79 | Temp 98.0°F | Ht 63.0 in | Wt 116.0 lb

## 2010-12-18 DIAGNOSIS — E538 Deficiency of other specified B group vitamins: Secondary | ICD-10-CM

## 2010-12-18 DIAGNOSIS — D649 Anemia, unspecified: Secondary | ICD-10-CM

## 2010-12-18 DIAGNOSIS — R700 Elevated erythrocyte sedimentation rate: Secondary | ICD-10-CM

## 2010-12-18 NOTE — Patient Instructions (Signed)
[facility]  Discharge Instructions  RECOMMENDATIONS MADE BY THE CONSULTANT AND ANY TEST RESULTS WILL BE SENT TO YOUR REFERRING DOCTOR.   EXAM FINDINGS BY MD TODAY AND SIGNS AND SYMPTOMS TO REPORT TO CLINIC OR PRIMARY MD: Dr. Mariel Sleet says that your hemoglobin normalized after the prednisone   SPECIAL INSTRUCTIONS/FOLLOW-UP: Lab work Needed  Nov 27th then to see Dr. Mariel Sleet after that.   I acknowledge that I have been informed and understand all the instructions given to me and received a copy. I do not have any more questions at this time, but understand that I may call the Specialty Clinic at [facility] at [phone] during business hours should I have any further questions or need assistance in obtaining follow-up care.    __________________________________________  _____________  __________ Signature of Patient or Authorized Representative            Date                   Time    __________________________________________ Nurse's Signature

## 2010-12-18 NOTE — Progress Notes (Signed)
This office note has been dictated.

## 2010-12-19 NOTE — Progress Notes (Signed)
DIAGNOSES: 1. Anemia in the setting of an elevated sedimentation rate, which has     completely normalized after 10 mg of prednisone b.i.d. 2. History of B12 deficiency on replacement therapy.  Kaitlyn Cooper's husband had been in the hospital last week.  She was back and forth there a couple times a day.  She did not see that she felt any different, but her hemoglobin is absolutely now normal, 12.4 g compared to 10.4 g.  White count is minimally elevated probably from the steroids.  Platelets are normal.  She is going to see me back in a month off steroids completely and we will see how she is getting along.  I will check her lab again and then see her back in probably late November or early December.  She is going to try to keep tabs on how she is doing.  She really cannot tell that she is better and has more energy, but I am hoping that it may have made a difference.  But, we may have to try it again when her husband is not in the hospital.    ______________________________ Ladona Horns. Mariel Sleet, MD ESN/MEDQ  D:  12/18/2010  T:  12/19/2010  Job:  [id number]

## 2011-01-14 ENCOUNTER — Other Ambulatory Visit (HOSPITAL_COMMUNITY): Payer: Medicare Other

## 2011-01-17 ENCOUNTER — Encounter (HOSPITAL_COMMUNITY): Payer: Medicare Other | Attending: Oncology

## 2011-01-17 ENCOUNTER — Encounter: Payer: Self-pay | Admitting: Internal Medicine

## 2011-01-17 ENCOUNTER — Encounter (HOSPITAL_COMMUNITY): Payer: Medicare Other | Admitting: Oncology

## 2011-01-17 DIAGNOSIS — D649 Anemia, unspecified: Secondary | ICD-10-CM | POA: Insufficient documentation

## 2011-01-17 LAB — RETICULOCYTES
RBC.: 3.49 MIL/uL — ABNORMAL LOW (ref 3.87–5.11)
Retic Ct Pct: 1.5 % (ref 0.4–3.1)

## 2011-01-17 LAB — CBC
MCH: 31.2 pg (ref 26.0–34.0)
MCHC: 32.9 g/dL (ref 30.0–36.0)
MCV: 94.8 fL (ref 78.0–100.0)
Platelets: 274 10*3/uL (ref 150–400)
RDW: 14 % (ref 11.5–15.5)

## 2011-01-17 LAB — COMPREHENSIVE METABOLIC PANEL
Albumin: 3.2 g/dL — ABNORMAL LOW (ref 3.5–5.2)
Alkaline Phosphatase: 54 U/L (ref 39–117)
BUN: 23 mg/dL (ref 6–23)
Creatinine, Ser: 0.98 mg/dL (ref 0.50–1.10)
GFR calc Af Amer: 61 mL/min — ABNORMAL LOW (ref >90)
Glucose, Bld: 115 mg/dL — ABNORMAL HIGH (ref 70–99)
Total Bilirubin: 0.2 mg/dL — ABNORMAL LOW (ref 0.3–1.2)
Total Protein: 7.3 g/dL (ref 6.0–8.3)

## 2011-01-17 LAB — DIFFERENTIAL
Basophils Relative: 0 % (ref 0–1)
Eosinophils Absolute: 0.2 10*3/uL (ref 0.0–0.7)
Eosinophils Relative: 2 % (ref 0–5)
Neutrophils Relative %: 63 % (ref 43–77)

## 2011-01-17 LAB — SEDIMENTATION RATE: Sed Rate: 90 mm/hr — ABNORMAL HIGH (ref 0–22)

## 2011-01-17 NOTE — Progress Notes (Unsigned)
Kaitlyn Cooper presented for labwork. Labs per MD order drawn via Peripheral Line 23 gauge needle inserted in right AC  Good blood return present.  Labs drawn as ordered by MD. Procedure without incident.  Portacath flushed with 20ml NS and 500U/57ml Heparin per protocol and needle removed intact. Patient tolerated procedure well.

## 2011-02-04 ENCOUNTER — Encounter (HOSPITAL_COMMUNITY): Payer: Medicare Other | Attending: Oncology | Admitting: Oncology

## 2011-02-04 DIAGNOSIS — R700 Elevated erythrocyte sedimentation rate: Secondary | ICD-10-CM | POA: Insufficient documentation

## 2011-02-04 DIAGNOSIS — D649 Anemia, unspecified: Secondary | ICD-10-CM

## 2011-02-04 MED ORDER — PREDNISONE 10 MG PO TABS: 10.0000 mg | ORAL_TABLET | Freq: Two times a day (BID) | ORAL | Status: AC

## 2011-02-04 NOTE — Progress Notes (Signed)
Kaitlyn Punt, MD, MD [address] [place] [address]  1. ESR raised  predniSONE (DELTASONE) 10 MG tablet, CBC, Differential, Comprehensive metabolic panel, Sedimentation rate, C-reactive protein  2. Anemia  predniSONE (DELTASONE) 10 MG tablet, CBC, Differential, Comprehensive metabolic panel, Sedimentation rate, C-reactive protein  3. ANEMIA      CURRENT THERAPY: S/P prednisone 10 mg BID trial x 1 month.  INTERVAL HISTORY: Kaitlyn Cooper 77 y.o. female returns for  regular  visit for followup of normocytic anemia of unclear etiology in the setting of an elevated sedimentation rate.  The patient has a normocytic anemia in the setting of an elevated sedimentation rate. The patient was tried on a one month trial of steroids, namely 10 mg of prednisone twice daily for one month's time. At the conclusion of this trial, blood work was performed which revealed normalization of not only the patient's hemoglobin but also her sedimentation rate.  She comes in today following this one-month trial. She has been off her prednisone for one month's time. Followup laboratory work was performed which reveals again, anemia and an elevated segmentation rate.  I personally reviewed and went over laboratory results with the patient.  Interestingly, the patient's sedimentation rate is elevated with an associated anemia when the patient is off steroids, but when the patient is on steroids, her sedimentation rate normalizes and her anemia resolves.     It appears as though the patient has some sort of inflammatory process present.  I spent some time and patient going over education regarding inflammatory processes within the body that cause an increased sedimentation rate and associated anemia. Assessment, the patient going over education regarding the role of hemoglobin in the body. I used an analogy to help illustrate this further to the patient.  After reviewing laboratory work in speaking with the  patient, he was decided that we will try another month trial of prednisone 10 mg twice a day daily. Following this trial we will perform laboratory work again including a sedimentation rate. If this is improved while on prednisone, we will need to consider a rheumatologic consult for the patient.  Hematologically, the patient denies any complaints. Complete review of system questioning is negative.  She reports that she is unable to tell me whether she feels better today compared to a month ago. She reports that it is "hard for me to remember what I did yesterday compared to how he felt one month ago."  I've asked the patient to take a mental snapshot of how she feels today and compare to how she feels in a month's time when she is on prednisone.  Past Medical History  Diagnosis Date  . Anemia     followed by Dr.Neijstrom  . Hyperlipidemia   . Depression   . Hypothyroidism   . History of atherosclerotic cardiovascular disease   . History of colonoscopy     02/2002 showed few diverticula at sigmoid colon otherwise normal colonoscopy and terminal ileoscopy 1995 several colon polyps ,tubular adenoma  . GERD (gastroesophageal reflux disease)     chronic  . GERD (gastroesophageal reflux disease)     chronic gerd  . Tiredness     has COLONIC POLYPS, ADENOMATOUS; HYPOTHYROIDISM; HYPERLIPIDEMIA; ANEMIA; DEPRESSION; ESSENTIAL HYPERTENSION, BENIGN; BRADYCARDIA; ATHEROSCLEROTIC CARDIOVASCULAR DISEASE; GASTROESOPHAGEAL REFLUX DISEASE, CHRONIC; OSTEOPOROSIS; OSTEOPENIA; DYSPHAGIA; DIARRHEA, CHRONIC; DEPRESSION, HX OF; COLONIC POLYPS, HX OF; and CARDIAC PACEMAKER IN SITU on her problem list.     is allergic to penicillins.  Kaitlyn Cooper does not currently have  medications on file.  Past Surgical History  Procedure Date  . Insert / replace / remove pacemaker 01/17/2009    [employer] Cardiac Pacemaker   . Cardiac catheterization 1998  . Cholecystectomy   . 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   PHYSICAL EXAMINATION  ECOG PERFORMANCE STATUS: 0 - Asymptomatic  Filed Vitals:   02/04/11 1407  BP: 152/75  Pulse: 80  Temp: 97.5 F (36.4 C)    GENERAL:alert, no distress, well nourished, well developed, comfortable, cooperative and smiling SKIN: skin color, texture, turgor are normal, no rashes or significant lesions HEAD: Normocephalic, No masses, lesions, tenderness or abnormalities EYES: normal EARS: External ears normal OROPHARYNX:mucous membranes are moist  NECK: supple, no adenopathy, trachea midline LYMPH:  no palpable lymphadenopathy BREAST:not examined LUNGS: clear to auscultation and percussion HEART: regular rate & rhythm, no murmurs, no gallops, S1 normal and S2 normal ABDOMEN:abdomen soft, non-tender and normal bowel sounds BACK: Back symmetric, no curvature., No CVA tenderness EXTREMITIES:less then 2 second capillary refill, no joint deformities, effusion, or inflammation, no edema, no skin discoloration, no clubbing, no cyanosis  NEURO: alert & oriented x 3 with fluent speech, no focal motor/sensory deficits, gait normal   LABORATORY DATA: CBC    Component Value Date/Time   WBC 9.8 01/17/2011 1246   RBC 3.49* 01/17/2011 1246   HGB 10.9* 01/17/2011 1246   HCT 33.1* 01/17/2011 1246   PLT 274 01/17/2011 1246   MCV 94.8 01/17/2011 1246   MCH 31.2 01/17/2011 1246   MCHC 32.9 01/17/2011 1246   RDW 14.0 01/17/2011 1246   LYMPHSABS 2.6 01/17/2011 1246   MONOABS 0.8 01/17/2011 1246   EOSABS 0.2 01/17/2011 1246   BASOSABS 0.0 01/17/2011 1246      Chemistry      Component Value Date/Time   NA 137 01/17/2011 1246   K 4.0 01/17/2011 1246   CL 102 01/17/2011 1246    CO2 25 01/17/2011 1246   BUN 23 01/17/2011 1246   CREATININE 0.98 01/17/2011 1246   CREATININE 1.19 08/29/2008 0934      Component Value Date/Time   CALCIUM 9.6 01/17/2011 1246   ALKPHOS 54 01/17/2011 1246   AST 15 01/17/2011 1246   ALT 5 01/17/2011 1246   BILITOT 0.2* 01/17/2011 1246      Results for KYLINN, SHROPSHIRE (MRN [id number]) as of 02/04/2011 16:34  Ref. Range 12/16/2010 10:52 01/17/2011 12:46  WBC Latest Range: 4.0-10.5 K/uL 11.4 (H) 9.8  RBC Latest Range: 3.87-5.11 MIL/uL 4.01 3.49 (L)  HGB Latest Range: 12.0-15.0 g/dL 40.9 81.1 (L)  HCT Latest Range: 36.0-46.0 % 38.4 33.1 (L)  MCV Latest Range: 78.0-100.0 fL 95.8 94.8  MCH Latest Range: 26.0-34.0 pg 30.9 31.2  MCHC Latest Range: 30.0-36.0 g/dL 91.4 78.2  RDW Latest Range: 11.5-15.5 % 14.5 14.0  Platelets Latest Range: 150-400 K/uL 243 274    Results for DARIKA, ILDEFONSO (MRN [id number]) as of 02/04/2011 16:34  Ref. Range 12/16/2010 10:52 01/17/2011 12:46  Sed Rate Latest Range: 0-22 mm/hr 5 90 (H)      ASSESSMENT:  1. Anemia in the setting of an elevated sedimentation rate, which has completely normalized after 10 mg of prednisone b.i.d.  2. History of B12 deficiency on replacement therapy.   PLAN:  1. Will re-initiate Prednisone 10 mg BID daily x 30 days. 2. Lab work in 1 month: CBC diff, CMET, ESR, CRP. 3. If resolution of anemia occurs and a decrease in ESR is noted, may need to consider Rheumatology consult for further evaluation and treatment.  4. I personally reviewed and went over laboratory results with the patient. 5. Patient education regarding inflammatory processes and its association with anemia.  Patient education regarding the role of prednisone being an anti-inflammatory.  I corolated her Prednisone treatment with her lab work and illustrated the changes in her lab work on and off the prednisone.  6. Return in 1 month following lab work for follow-up.  All questions were answered. The patient  knows to call the clinic with any problems, questions or concerns. We can certainly see the patient much sooner if necessary.   KEFALAS,THOMAS

## 2011-02-04 NOTE — Patient Instructions (Signed)
Kaitlyn Cooper  [id number] 1927-02-15   [facility]  Discharge Instructions  RECOMMENDATIONS MADE BY THE CONSULTANT AND ANY TEST RESULTS WILL BE SENT TO YOUR REFERRING DOCTOR.   EXAM FINDINGS BY MD TODAY AND SIGNS AND SYMPTOMS TO REPORT TO CLINIC OR PRIMARY MD: Your hemoglobin has dropped some since you stopped the prednisone and your sedimentation rate is higher.  Would like to try you on another month of prednisone.  If it helps we may need to get you to see a specialist that deals with inflammation.  MEDICATIONS PRESCRIBED: Prednisone take 10 mg twice daily Follow label directions  INSTRUCTIONS GIVEN AND DISCUSSED: Other :  Report infections or other problems  SPECIAL INSTRUCTIONS/FOLLOW-UP: Lab work Needed in 1 month and Return to Clinic after labs results are available.   I acknowledge that I have been informed and understand all the instructions given to me and received a copy. I do not have any more questions at this time, but understand that I may call the Specialty Clinic at [facility] at [phone] during business hours should I have any further questions or need assistance in obtaining follow-up care.    __________________________________________  _____________  __________ Signature of Patient or Authorized Representative            Date                   Time    __________________________________________ Nurse's Signature

## 2011-03-07 ENCOUNTER — Encounter (HOSPITAL_COMMUNITY): Payer: Medicare Other | Attending: Oncology

## 2011-03-07 ENCOUNTER — Other Ambulatory Visit (HOSPITAL_COMMUNITY): Payer: Medicare Other

## 2011-03-07 DIAGNOSIS — D649 Anemia, unspecified: Secondary | ICD-10-CM | POA: Insufficient documentation

## 2011-03-07 DIAGNOSIS — R700 Elevated erythrocyte sedimentation rate: Secondary | ICD-10-CM

## 2011-03-07 LAB — CBC
HCT: 36.4 % (ref 36.0–46.0)
Hemoglobin: 11.8 g/dL — ABNORMAL LOW (ref 12.0–15.0)
MCH: 30.4 pg (ref 26.0–34.0)
MCHC: 32.4 g/dL (ref 30.0–36.0)
MCV: 93.8 fL (ref 78.0–100.0)

## 2011-03-07 LAB — DIFFERENTIAL
Basophils Relative: 0 % (ref 0–1)
Eosinophils Absolute: 0.1 10*3/uL (ref 0.0–0.7)
Monocytes Absolute: 1 10*3/uL (ref 0.1–1.0)
Monocytes Relative: 6 % (ref 3–12)
Neutro Abs: 12.2 10*3/uL — ABNORMAL HIGH (ref 1.7–7.7)

## 2011-03-07 LAB — COMPREHENSIVE METABOLIC PANEL
Albumin: 3.3 g/dL — ABNORMAL LOW (ref 3.5–5.2)
BUN: 29 mg/dL — ABNORMAL HIGH (ref 6–23)
Chloride: 100 mEq/L (ref 96–112)
Creatinine, Ser: 1.12 mg/dL — ABNORMAL HIGH (ref 0.50–1.10)
Total Bilirubin: 0.3 mg/dL (ref 0.3–1.2)

## 2011-03-07 LAB — SEDIMENTATION RATE: Sed Rate: 45 mm/hr — ABNORMAL HIGH (ref 0–22)

## 2011-03-07 NOTE — Progress Notes (Signed)
Labs drawn today for cbc/diff/cmp,esr,crp

## 2011-03-08 LAB — C-REACTIVE PROTEIN: CRP: 0.26 mg/dL — ABNORMAL LOW (ref <0.60)

## 2011-03-11 ENCOUNTER — Encounter (HOSPITAL_COMMUNITY): Payer: Self-pay | Admitting: Oncology

## 2011-03-11 ENCOUNTER — Encounter (HOSPITAL_BASED_OUTPATIENT_CLINIC_OR_DEPARTMENT_OTHER): Payer: Medicare Other | Admitting: Oncology

## 2011-03-11 VITALS — BP 112/71 | HR 91 | Wt 109.2 lb

## 2011-03-11 DIAGNOSIS — D649 Anemia, unspecified: Secondary | ICD-10-CM

## 2011-03-11 MED ORDER — PREDNISONE 5 MG PO TABS: 5.0000 mg | ORAL_TABLET | Freq: Every day | ORAL | Status: AC

## 2011-03-11 NOTE — Patient Instructions (Signed)
Kaitlyn Cooper  [id number] 06-Jul-1926   [facility]  Discharge Instructions  RECOMMENDATIONS MADE BY THE CONSULTANT AND ANY TEST RESULTS WILL BE SENT TO YOUR REFERRING DOCTOR.   EXAM FINDINGS BY MD TODAY AND SIGNS AND SYMPTOMS TO REPORT TO CLINIC OR PRIMARY MD: Need to continue with the prednisone.  You are responding to the prednisone  MEDICATIONS PRESCRIBED: Prednisone 5 mg tablets.  Take 2 pills in the mornings and 1 pill in the evenings for 2 weeks then take 1 pill twice daily   INSTRUCTIONS GIVEN AND DISCUSSED:   SPECIAL INSTRUCTIONS/FOLLOW-UP: Lab work Needed in 1 month and MD visit a few days after.   I acknowledge that I have been informed and understand all the instructions given to me and received a copy. I do not have any more questions at this time, but understand that I may call the Specialty Clinic at [facility] at [phone] during business hours should I have any further questions or need assistance in obtaining follow-up care.    __________________________________________  _____________  __________ Signature of Patient or Authorized Representative            Date                   Time    __________________________________________ Nurse's Signature

## 2011-03-11 NOTE — Progress Notes (Signed)
CC:   Scott A. Luking, MD  DIAGNOSIS:  Normocytic anemia with mild symptomatology, but with a very high sedimentation rate.  She responds very nicely to low-dose prednisone with significant improvement and normalization of her hemoglobin with therapy.  As soon as she comes off therapy, her hemoglobin drops down.  Lexxi started the prednisone again after I saw her on her last visit here a month ago.  She ran out on Saturday.  She had no problems tolerating the prednisone she states.  Her labs once again respond nicely to the prednisone.  Hemoglobin is 11.8, up from 10.9.  Her white count is mildly elevated probably due to the prednisone.  Sedimentation rate has dropped from 90 down to 45.  When she had perfectly normal counts essentially in October, her sedimentation rate was 5, so I think we have an inflammatory disorder that responds to prednisone.  We need to find the lowest possible dose to maintain her hemoglobin etc.  She definitely has felt better in the last 2 weeks.  We will see her back in another month.  We put her on 10 mg in the morning, 5 mg in the evening and see what her counts are then.    ______________________________ Ladona Horns. Mariel Sleet, MD ESN/MEDQ  D:  03/11/2011  T:  03/11/2011  Job:  [id number]

## 2011-03-11 NOTE — Progress Notes (Signed)
This office note has been dictated.

## 2011-04-11 ENCOUNTER — Encounter (HOSPITAL_COMMUNITY): Payer: Medicare Other | Attending: Oncology

## 2011-04-11 DIAGNOSIS — D649 Anemia, unspecified: Secondary | ICD-10-CM

## 2011-04-11 LAB — BASIC METABOLIC PANEL
BUN: 28 mg/dL — ABNORMAL HIGH (ref 6–23)
Calcium: 8.8 mg/dL (ref 8.4–10.5)
Chloride: 104 mEq/L (ref 96–112)
Creatinine, Ser: 0.98 mg/dL (ref 0.50–1.10)
GFR calc Af Amer: 61 mL/min — ABNORMAL LOW (ref >90)
GFR calc non Af Amer: 52 mL/min — ABNORMAL LOW (ref >90)

## 2011-04-11 LAB — CBC
MCH: 30.6 pg (ref 26.0–34.0)
MCV: 94.5 fL (ref 78.0–100.0)
Platelets: 251 10*3/uL (ref 150–400)
RBC: 3.79 MIL/uL — ABNORMAL LOW (ref 3.87–5.11)
RDW: 14.5 % (ref 11.5–15.5)

## 2011-04-11 LAB — SEDIMENTATION RATE: Sed Rate: 54 mm/h — ABNORMAL HIGH (ref 0–22)

## 2011-04-11 NOTE — Progress Notes (Signed)
Kaitlyn Cooper presented for labwork. Labs per MD order drawn via Peripheral Line 25 gauge needle inserted in rt ac.   Procedure without incident.   Patient tolerated procedure well.

## 2011-04-14 ENCOUNTER — Encounter (HOSPITAL_BASED_OUTPATIENT_CLINIC_OR_DEPARTMENT_OTHER): Payer: Medicare Other | Admitting: Oncology

## 2011-04-14 VITALS — BP 146/71 | HR 85 | Temp 97.5°F | Wt 111.2 lb

## 2011-04-14 DIAGNOSIS — D649 Anemia, unspecified: Secondary | ICD-10-CM

## 2011-04-14 NOTE — Patient Instructions (Signed)
Kaitlyn Cooper  [id number] 1926/04/19   [facility]  Discharge Instructions  RECOMMENDATIONS MADE BY THE CONSULTANT AND ANY TEST RESULTS WILL BE SENT TO YOUR REFERRING DOCTOR.   EXAM FINDINGS BY MD TODAY AND SIGNS AND SYMPTOMS TO REPORT TO CLINIC OR PRIMARY MD: will do a prednisone taper.  Follow Dr. Thornton Papas instructions.  MEDICATIONS PRESCRIBED: Prednisone taper      SPECIAL INSTRUCTIONS/FOLLOW-UP: Lab work Needed in April  and Return to Clinic after labs in April.   I acknowledge that I have been informed and understand all the instructions given to me and received a copy. I do not have any more questions at this time, but understand that I may call the Specialty Clinic at [facility] at [phone] during business hours should I have any further questions or need assistance in obtaining follow-up care.    __________________________________________  _____________  __________ Signature of Patient or Authorized Representative            Date                   Time    __________________________________________ Nurse's Signature

## 2011-04-14 NOTE — Progress Notes (Signed)
CC:   [name], MD  DIAGNOSES: 1. Normocytic anemia though somewhat complicated, characterized by     high sedimentation rate in the 80-90 range.  When she has steroid     therapy, however, and the sedimentation rate comes down to normal     her hemoglobin usually is in the normal range.  Elaine also has some     weakness and fatigue, some aching at the shoulders and base of the     neck, which she states is somewhat better but not perfectly normal     and not gone completely.  She also has history of B12 deficiency on     replacement monthly with Dr. Gerda Diss. 2. History of herpes zoster 03/2008. 3. Possible irritable bowel syndrome. 4. Cholecystectomy in the past. 5. Hysterectomy for benign disease in the past. 6. Bladder suspension surgery by Dr. Emelda Fear after a hysterectomy     years ago. 7. Smoking history of about 30 years, working up to about a pack a day     before quitting. 8. Hypercholesterolemia. 9. Ice craving in the past, no longer an issue.  So she has had this anemia which is a little confusing but not necessarily related to anything other than a possible inflammatory disorder.  She is on 5 mg of prednisone b.i.d. presently and we are going to taper her off to see if it makes any difference.  She is once again not completely sure that this has made a difference in how she feels.  Her sedimentation rates have obviously not been above 100, but I still worry about a PMR like disorder.  So we will see what her counts are in 7 weeks.  By that time she will be off all therapy for about 3 weeks.  I have given her a tapering schedule over the next 28 days, and she is to let us know if she is not doing well.    ______________________________ Ladona Horns. Mariel Sleet, MD ESN/MEDQ  D:  04/14/2011  T:  04/14/2011  Job:  [id number]

## 2011-04-14 NOTE — Progress Notes (Signed)
This office note has been dictated.

## 2011-06-02 ENCOUNTER — Encounter: Payer: Medicare Other | Admitting: Internal Medicine

## 2011-06-03 ENCOUNTER — Encounter (HOSPITAL_COMMUNITY): Payer: Medicare Other | Attending: Oncology

## 2011-06-03 DIAGNOSIS — D649 Anemia, unspecified: Secondary | ICD-10-CM

## 2011-06-03 LAB — DIFFERENTIAL
Basophils Absolute: 0 10*3/uL (ref 0.0–0.1)
Basophils Relative: 0 % (ref 0–1)
Lymphocytes Relative: 37 % (ref 12–46)
Neutro Abs: 4.5 10*3/uL (ref 1.7–7.7)
Neutrophils Relative %: 50 % (ref 43–77)
Smear Review: DECREASED

## 2011-06-03 LAB — CBC
MCHC: 31.5 g/dL (ref 30.0–36.0)
Platelets: 280 10*3/uL (ref 150–400)
RDW: 14.2 % (ref 11.5–15.5)
WBC: 8.8 10*3/uL (ref 4.0–10.5)

## 2011-06-04 NOTE — Progress Notes (Signed)
Lab draw

## 2011-06-06 ENCOUNTER — Encounter (HOSPITAL_BASED_OUTPATIENT_CLINIC_OR_DEPARTMENT_OTHER): Payer: Medicare Other | Admitting: Oncology

## 2011-06-06 ENCOUNTER — Encounter (HOSPITAL_COMMUNITY): Payer: Self-pay | Admitting: Oncology

## 2011-06-06 VITALS — BP 113/54 | HR 64 | Temp 97.0°F | Wt 113.0 lb

## 2011-06-06 DIAGNOSIS — D649 Anemia, unspecified: Secondary | ICD-10-CM

## 2011-06-06 NOTE — Progress Notes (Signed)
This office note has been dictated.

## 2011-06-06 NOTE — Progress Notes (Signed)
CC:   [name], MD  DIAGNOSES: 1. Anemia normocytic type complicated by high sedimentation rate in     the approximately 90 mm range.  On therapy she had normalization of     her hemoglobin and her sedimentation rate but some weakness and     fatigue.  At some point in the past she also had aching in her     shoulders and base of the neck and I could not be sure she does not     have some mild element of PMR.  She is now off prednisone and she     has not felt that it has made a big difference in her sense of well-     being when we did get her hemoglobin to normal. 2. Herpes zoster in February 2010. 3. History of irritable bowel syndrome. 4. Cholecystectomy. 5. History of hysterectomy. 6. Bladder suspension surgery by Dr. Christin Bach after hysterectomy     years ago. 7. Smoking history of about 30 years, working her way up to a pack a     day before quitting. 8. Hypercholesterolemia. 9. Ice craving in the past.  So Kaitlyn Cooper is now off prednisone and she is going down as far as her hemoglobin.  It has gone down from 12.4 in October to 10.7.  It has been a gradual decline.  White count is in the normal range and she is off the prednisone, platelets are still in the normal range.  Sedimentation rate is rising from 5 back in October went as high as 90 in November and we put her back on steroids and it went down to 45.  Now that she is off prednisone it is back up to 54 but she does not feel bad.  So we will just follow her along.  We will see her counts again in 3 months along with a sedimentation rate, and hope that she is doing well. If she changes in the interim we will see her sooner.  Most of the time we talked about her husband who has developed acute leukemia.  She is upset but handling it a little bit better.  We will see her back as I mentioned.    ______________________________ Ladona Horns. Mariel Sleet, MD ESN/MEDQ  D:  06/06/2011  T:  06/06/2011  Job:  [id number]

## 2011-06-06 NOTE — Patient Instructions (Signed)
Kaitlyn Cooper  [id number] 02-24-1926 Dr. Glenford Peers   [facility]  Discharge Instructions  RECOMMENDATIONS MADE BY THE CONSULTANT AND ANY TEST RESULTS WILL BE SENT TO YOUR REFERRING DOCTOR.   EXAM FINDINGS BY MD TODAY AND SIGNS AND SYMPTOMS TO REPORT TO CLINIC OR PRIMARY MD: Exam and discussion per MD.  Blood counts are stable.  MEDICATIONS PRESCRIBED: none   INSTRUCTIONS GIVEN AND DISCUSSED: Other :  Report shortness of breath, increased fatigue.  SPECIAL INSTRUCTIONS/FOLLOW-UP: Lab work Needed in July and Return to Clinic after labs in July.   I acknowledge that I have been informed and understand all the instructions given to me and received a copy. I do not have any more questions at this time, but understand that I may call the Specialty Clinic at [facility] at [phone] during business hours should I have any further questions or need assistance in obtaining follow-up care.    __________________________________________  _____________  __________ Signature of Patient or Authorized Representative            Date                   Time    __________________________________________ Nurse's Signature

## 2011-08-25 ENCOUNTER — Ambulatory Visit (INDEPENDENT_AMBULATORY_CARE_PROVIDER_SITE_OTHER): Payer: Medicare Other | Admitting: Internal Medicine

## 2011-08-25 ENCOUNTER — Telehealth (HOSPITAL_COMMUNITY): Payer: Self-pay

## 2011-08-25 ENCOUNTER — Encounter (HOSPITAL_COMMUNITY): Payer: Medicare Other | Attending: Oncology

## 2011-08-25 ENCOUNTER — Encounter: Payer: Self-pay | Admitting: Internal Medicine

## 2011-08-25 VITALS — BP 104/57 | HR 60 | Ht 64.0 in | Wt 111.4 lb

## 2011-08-25 DIAGNOSIS — I10 Essential (primary) hypertension: Secondary | ICD-10-CM

## 2011-08-25 DIAGNOSIS — Z950 Presence of cardiac pacemaker: Secondary | ICD-10-CM

## 2011-08-25 DIAGNOSIS — I498 Other specified cardiac arrhythmias: Secondary | ICD-10-CM

## 2011-08-25 DIAGNOSIS — D649 Anemia, unspecified: Secondary | ICD-10-CM | POA: Insufficient documentation

## 2011-08-25 LAB — PACEMAKER DEVICE OBSERVATION
ATRIAL PACING PM: 81
BAMS-0001: 130 {beats}/min
DEVICE MODEL PM: 7094651
RV LEAD IMPEDENCE PM: 625 Ohm
RV LEAD THRESHOLD: 0.875 V
VENTRICULAR PACING PM: 1

## 2011-08-25 LAB — CBC
HCT: 34.8 % — ABNORMAL LOW (ref 36.0–46.0)
MCH: 30.2 pg (ref 26.0–34.0)
MCHC: 32.5 g/dL (ref 30.0–36.0)
MCV: 93 fL (ref 78.0–100.0)
RBC: 3.74 MIL/uL — ABNORMAL LOW (ref 3.87–5.11)
WBC: 9.6 10*3/uL (ref 4.0–10.5)

## 2011-08-25 LAB — DIFFERENTIAL
Basophils Relative: 1 % (ref 0–1)
Monocytes Absolute: 1 10*3/uL (ref 0.1–1.0)
Monocytes Relative: 10 % (ref 3–12)
Neutro Abs: 5.3 10*3/uL (ref 1.7–7.7)

## 2011-08-25 NOTE — Telephone Encounter (Signed)
Notified that labs were stable.

## 2011-08-25 NOTE — Progress Notes (Signed)
Labs drawn today for cbc/diff , sed rate 

## 2011-08-25 NOTE — Patient Instructions (Signed)
**Note De-Identified Neenah Canter Obfuscation** Your physician recommends that you continue on your current medications as directed. Please refer to the Current Medication list given to you today.  Your physician recommends that you schedule a follow-up appointment in: 6 months for a device check and 1 year with Dr. Ladona Ridgel

## 2011-08-25 NOTE — Assessment & Plan Note (Signed)
Her blood pressure is well controlled. Will recheck in several months. 

## 2011-08-25 NOTE — Assessment & Plan Note (Signed)
Her device is working normally. Will recheck in several months. 

## 2011-08-25 NOTE — Progress Notes (Signed)
HPI [name] returns today for followup. She is a pleasant elderly woman with a h/o HTN, symptomatic bradycardia, s/p PPM. She c/o feeling weak at times but denies chest pain or sob.  Allergies  Allergen Reactions  . Penicillins     REACTION: severe rash     Current Outpatient Prescriptions  Medication Sig Dispense Refill  . acetaminophen (TYLENOL) 325 MG tablet Take 650 mg by mouth every 6 (six) hours as needed.        . Cholecalciferol (VITAMIN D) 400 UNITS capsule Take 400 Units by mouth daily.        Marland Kitchen levothyroxine (SYNTHROID, LEVOTHROID) 75 MCG tablet Take 75 mcg by mouth daily.        Marland Kitchen lovastatin (MEVACOR) 20 MG tablet Take 20 mg by mouth at bedtime.        . ranitidine (ZANTAC) 150 MG tablet Take 150 mg by mouth daily. Takes 1 daily and may take an additional pill if needed      . sertraline (ZOLOFT) 100 MG tablet Take 100 mg by mouth daily.           Past Medical History  Diagnosis Date  . Anemia     followed by Dr.Neijstrom  . Hyperlipidemia   . Depression   . Hypothyroidism   . History of atherosclerotic cardiovascular disease   . History of colonoscopy     02/2002 showed few diverticula at sigmoid colon otherwise normal colonoscopy and terminal ileoscopy 1995 several colon polyps ,tubular adenoma  . GERD (gastroesophageal reflux disease)     chronic  . GERD (gastroesophageal reflux disease)     chronic gerd  . Tiredness     ROS:   All systems reviewed and negative except as noted in the HPI.   Past Surgical History  Procedure Date  . Insert / replace / remove pacemaker 01/17/2009    St.Jude Cardiac Pacemaker   . Cardiac catheterization 1998  . Cholecystectomy   . Temporal carcinoma 2004    excision of right temporal carcinoma      No family history on file.   History   Social History  . Marital Status: Married    Spouse Name: N/A    Number of Children: N/A  . Years of Education: N/A   Occupational History  . Not on file.   Social  History Main Topics  . Smoking status: Former Smoker -- 1.0 packs/day for 40 years    Quit date: 08/25/1971  . Smokeless tobacco: Never Used  . Alcohol Use: No  . Drug Use: No  . Sexually Active: Not on file   Other Topics Concern  . Not on file   Social History Narrative  . No narrative on file     BP 104/57  Pulse 60  Ht 5\' 4"  (1.626 m)  Wt 111 lb 6.4 oz (50.531 kg)  BMI 19.12 kg/m2  Physical Exam:  Well appearing eldelry woman, NAD HEENT: Unremarkable Neck:  No JVD, no thyromegally Lymphatics:  No adenopathy Back:  No CVA tenderness Lungs:  Clear with no wheezes, rales, or rhonchi. Well healed PPM incision. HEART:  Regular rate rhythm, no murmurs, no rubs, no clicks Abd:  soft, positive bowel sounds, no organomegally, no rebound, no guarding Ext:  2 plus pulses, no edema, no cyanosis, no clubbing Skin:  No rashes no nodules Neuro:  CN II through XII intact, motor grossly intact  DEVICE  Normal device function.  See PaceArt for details.   Assess/Plan:

## 2011-08-27 ENCOUNTER — Encounter (HOSPITAL_BASED_OUTPATIENT_CLINIC_OR_DEPARTMENT_OTHER): Payer: Medicare Other | Admitting: Oncology

## 2011-08-27 ENCOUNTER — Encounter: Payer: Self-pay | Admitting: Internal Medicine

## 2011-08-27 VITALS — BP 149/69 | HR 83 | Temp 97.0°F | Ht 64.0 in | Wt 112.0 lb

## 2011-08-27 DIAGNOSIS — D649 Anemia, unspecified: Secondary | ICD-10-CM

## 2011-08-27 NOTE — Progress Notes (Signed)
Normocytic anemia consistent with anemia of chronic disease. She also has had an intermittently elevated sedimentation rate in the 50-70 range also with a nonspecific polyclonal increase in IgA levels.  She is also had B12 deficiency in the past on replacement therapy monthly.  Her blood counts are stable and indeed her hemoglobin is one of the better values we have seen lately. She has no new complaints. We will therefore see her back in about 6 months with repeat values and if she changes between now and then she can call us. She is I think more upset about her husbands diagnosis of acute leukemia than anything else.

## 2011-08-27 NOTE — Patient Instructions (Addendum)
Kaitlyn Cooper  [id number] 05/23/1926 Dr. Glenford Peers   [facility]  Discharge Instructions  RECOMMENDATIONS MADE BY THE CONSULTANT AND ANY TEST RESULTS WILL BE SENT TO YOUR REFERRING DOCTOR.   EXAM FINDINGS BY MD TODAY AND SIGNS AND SYMPTOMS TO REPORT TO CLINIC OR PRIMARY MD: you are doing well per MD.  Lab work is stable.    MEDICATIONS PRESCRIBED: Megace 1 teaspoon twice daily.   INSTRUCTIONS GIVEN AND DISCUSSED: Other :  Report increased fatigue, shortness of breath, increased ice intake, etc.  SPECIAL INSTRUCTIONS/FOLLOW-UP: Lab work Needed in 6 months and Return to Clinic in 6 months after labs to see MD.   I acknowledge that I have been informed and understand all the instructions given to me and received a copy. I do not have any more questions at this time, but understand that I may call the Specialty Clinic at [facility] at [phone] during business hours should I have any further questions or need assistance in obtaining follow-up care.    __________________________________________  _____________  __________ Signature of Patient or Authorized Representative            Date                   Time    __________________________________________ Nurse's Signature

## 2011-11-25 ENCOUNTER — Encounter: Payer: Self-pay | Admitting: *Deleted

## 2011-12-05 ENCOUNTER — Ambulatory Visit (INDEPENDENT_AMBULATORY_CARE_PROVIDER_SITE_OTHER): Payer: Medicare Other | Admitting: *Deleted

## 2011-12-05 DIAGNOSIS — I498 Other specified cardiac arrhythmias: Secondary | ICD-10-CM

## 2011-12-05 LAB — PACEMAKER DEVICE OBSERVATION
AL AMPLITUDE: 5 mv
BAMS-0001: 130 {beats}/min
BATTERY VOLTAGE: 2.9328 V
RV LEAD AMPLITUDE: 12 mv

## 2011-12-05 NOTE — Progress Notes (Signed)
PPM check 

## 2011-12-22 ENCOUNTER — Encounter: Payer: Self-pay | Admitting: Internal Medicine

## 2012-02-25 ENCOUNTER — Other Ambulatory Visit (HOSPITAL_COMMUNITY): Payer: Medicare Other

## 2012-02-27 ENCOUNTER — Ambulatory Visit (HOSPITAL_COMMUNITY): Payer: Medicare Other | Admitting: Oncology

## 2012-03-01 ENCOUNTER — Other Ambulatory Visit: Payer: Self-pay | Admitting: Family Medicine

## 2012-03-01 DIAGNOSIS — R1310 Dysphagia, unspecified: Secondary | ICD-10-CM

## 2012-03-02 ENCOUNTER — Ambulatory Visit (HOSPITAL_COMMUNITY)
Admission: RE | Admit: 2012-03-02 | Discharge: 2012-03-02 | Disposition: A | Discharge status: Home / Self Care | Payer: Medicare Other | Source: Ambulatory Visit | Attending: Family Medicine | Admitting: Family Medicine

## 2012-03-02 ENCOUNTER — Other Ambulatory Visit: Payer: Self-pay | Admitting: Family Medicine

## 2012-03-02 DIAGNOSIS — K449 Diaphragmatic hernia without obstruction or gangrene: Secondary | ICD-10-CM | POA: Insufficient documentation

## 2012-03-02 DIAGNOSIS — M542 Cervicalgia: Secondary | ICD-10-CM

## 2012-03-02 DIAGNOSIS — K222 Esophageal obstruction: Secondary | ICD-10-CM | POA: Insufficient documentation

## 2012-03-02 DIAGNOSIS — R1310 Dysphagia, unspecified: Secondary | ICD-10-CM

## 2012-03-02 DIAGNOSIS — M5030 Other cervical disc degeneration, unspecified cervical region: Secondary | ICD-10-CM | POA: Insufficient documentation

## 2012-03-02 DIAGNOSIS — M899 Disorder of bone, unspecified: Secondary | ICD-10-CM | POA: Insufficient documentation

## 2012-03-02 DIAGNOSIS — Q398 Other congenital malformations of esophagus: Secondary | ICD-10-CM | POA: Insufficient documentation

## 2012-03-16 ENCOUNTER — Ambulatory Visit (INDEPENDENT_AMBULATORY_CARE_PROVIDER_SITE_OTHER): Payer: Medicare Other | Admitting: Internal Medicine

## 2012-03-16 ENCOUNTER — Other Ambulatory Visit (INDEPENDENT_AMBULATORY_CARE_PROVIDER_SITE_OTHER): Payer: Self-pay | Admitting: *Deleted

## 2012-03-16 ENCOUNTER — Encounter (INDEPENDENT_AMBULATORY_CARE_PROVIDER_SITE_OTHER): Payer: Self-pay | Admitting: Internal Medicine

## 2012-03-16 ENCOUNTER — Encounter (INDEPENDENT_AMBULATORY_CARE_PROVIDER_SITE_OTHER): Payer: Self-pay | Admitting: *Deleted

## 2012-03-16 VITALS — BP 90/38 | HR 64 | Temp 97.7°F | Ht 64.0 in | Wt 108.3 lb

## 2012-03-16 DIAGNOSIS — K222 Esophageal obstruction: Secondary | ICD-10-CM

## 2012-03-16 DIAGNOSIS — Z800 Family history of malignant neoplasm of digestive organs: Secondary | ICD-10-CM

## 2012-03-16 DIAGNOSIS — R1310 Dysphagia, unspecified: Secondary | ICD-10-CM

## 2012-03-16 NOTE — Progress Notes (Signed)
Subjective:     Patient ID: Kaitlyn Cooper, female   DOB: [birth year], 79 y.o.   MRN: [id number]  HPI Referred to our office by Dr. Gerda Diss for dysphagia.  She has had dysphagia for several months. Does not occur frequently. Chicken has lodged in her esophagus. Appetite is good. No abdominal pain. Normal BMs  Hx of EGD in the past by Dr. Jena Gauss.   03/01/2012 Esophagram: dysphagia:IMPRESSION:  Moderate sized hiatal hernia.  Distal esophageal stricture just above the gastroesophageal  junction, obstructing a 12.5 mm diameter barium tablet.  Diffuse age-related impairment of esophageal motility.  Multiple distal thoracic esophageal diverticula and tiny left-sided Zenker diverticulum noted.  See Dr. Mariel Sleet for anemia. Appears her last colonoscopy was in 2003 by Dr. Karilyn Cota. Family hx of colon cancer (mother). Patient has declined colonoscopy today.  CBC    Component Value Date/Time   WBC 9.6 08/25/2011 1033   RBC 3.74* 08/25/2011 1033   HGB 11.3* 08/25/2011 1033   HCT 34.8* 08/25/2011 1033   PLT 295 08/25/2011 1033   MCV 93.0 08/25/2011 1033   MCH 30.2 08/25/2011 1033   MCHC 32.5 08/25/2011 1033   RDW 14.3 08/25/2011 1033   LYMPHSABS 3.0 08/25/2011 1033   MONOABS 1.0 08/25/2011 1033   EOSABS 0.2 08/25/2011 1033   BASOSABS 0.1 08/25/2011 1033    Review of Systems     Current Outpatient Prescriptions  Medication Sig Dispense Refill  . acetaminophen (TYLENOL) 325 MG tablet Take 325 mg by mouth at bedtime.       . Cholecalciferol (VITAMIN D) 400 UNITS capsule Take 400 Units by mouth daily.        Marland Kitchen estrogens, conjugated, (PREMARIN) 0.625 MG tablet Take 0.625 mg by mouth daily. Take daily for 21 days then do not take for 7 days.      Marland Kitchen levothyroxine (SYNTHROID, LEVOTHROID) 75 MCG tablet Take 75 mcg by mouth daily.        Marland Kitchen lovastatin (MEVACOR) 20 MG tablet Take 20 mg by mouth at bedtime.        . ranitidine (ZANTAC) 150 MG tablet Take 150 mg by mouth daily. Takes 1 daily and may take an additional pill if  needed      . sertraline (ZOLOFT) 100 MG tablet Take 100 mg by mouth daily.         Past Medical History  Diagnosis Date  . Anemia     followed by Dr.Neijstrom  . Hyperlipidemia   . Depression   . Hypothyroidism   . History of atherosclerotic cardiovascular disease   . History of colonoscopy     02/2002 showed few diverticula at sigmoid colon otherwise normal colonoscopy and terminal ileoscopy 1995 several colon polyps ,tubular adenoma  . GERD (gastroesophageal reflux disease)     chronic  . GERD (gastroesophageal reflux disease)     chronic gerd  . Tiredness    Past Surgical History  Procedure Date  . Insert / replace / remove pacemaker 01/17/2009    St.Jude Cardiac Pacemaker   . Cardiac catheterization 1998  . Cholecystectomy   . Temporal carcinoma 2004    excision of right temporal carcinoma    Allergies  Allergen Reactions  . Penicillins     REACTION: severe rash    Objective:   Physical Exam.    Filed Vitals:   03/16/12 1130  BP: 90/38  Pulse: 64  Temp: 97.7 F (36.5 C)  Height: 5\' 4"  (1.626 m)  Weight: 108 lb 4.8 oz (49.125  kg)  Alert and oriented. Skin warm and dry. Oral mucosa is moist.   . Sclera anicteric, conjunctivae is pink. Thyroid not enlarged. No cervical lymphadenopathy. Lungs clear. Heart regular rate and rhythm.  Abdomen is soft. Bowel sounds are positive. No hepatomegaly. No abdominal masses felt. No tenderness.  No edema to lower extremities.       Assessment:    Solid food dysphagia. Abnormal swallow test.  Family hx of colon cancer. Patient has declined a colonoscopy today. Plan:   EGD/ED.The risks and benefits such as perforation, bleeding, and infection were reviewed with the patient and is agreeable.

## 2012-03-16 NOTE — Patient Instructions (Addendum)
EGD/ED with Dr. Rehman. The risks and benefits such as perforation, bleeding, and infection were reviewed with the patient and is agreeable. 

## 2012-03-17 ENCOUNTER — Encounter (HOSPITAL_COMMUNITY): Payer: Self-pay | Admitting: Pharmacy Technician

## 2012-03-22 ENCOUNTER — Ambulatory Visit (HOSPITAL_COMMUNITY)
Admission: RE | Admit: 2012-03-22 | Discharge: 2012-03-22 | Disposition: A | Discharge status: Home / Self Care | Payer: Medicare Other | Source: Ambulatory Visit | Attending: Internal Medicine | Admitting: Internal Medicine

## 2012-03-22 ENCOUNTER — Encounter (HOSPITAL_COMMUNITY)
Admission: RE | Disposition: A | Discharge status: Home / Self Care | Payer: Self-pay | Source: Ambulatory Visit | Attending: Internal Medicine

## 2012-03-22 ENCOUNTER — Encounter (HOSPITAL_COMMUNITY): Payer: Self-pay | Admitting: *Deleted

## 2012-03-22 DIAGNOSIS — K222 Esophageal obstruction: Secondary | ICD-10-CM

## 2012-03-22 DIAGNOSIS — K449 Diaphragmatic hernia without obstruction or gangrene: Secondary | ICD-10-CM | POA: Insufficient documentation

## 2012-03-22 DIAGNOSIS — Q2733 Arteriovenous malformation of digestive system vessel: Secondary | ICD-10-CM | POA: Insufficient documentation

## 2012-03-22 DIAGNOSIS — K225 Diverticulum of esophagus, acquired: Secondary | ICD-10-CM

## 2012-03-22 DIAGNOSIS — K319 Disease of stomach and duodenum, unspecified: Secondary | ICD-10-CM

## 2012-03-22 DIAGNOSIS — R1310 Dysphagia, unspecified: Secondary | ICD-10-CM

## 2012-03-22 DIAGNOSIS — R933 Abnormal findings on diagnostic imaging of other parts of digestive tract: Secondary | ICD-10-CM

## 2012-03-22 HISTORY — PX: ESOPHAGOGASTRODUODENOSCOPY (EGD) WITH ESOPHAGEAL DILATION: SHX5812

## 2012-03-22 SURGERY — ESOPHAGOGASTRODUODENOSCOPY (EGD) WITH ESOPHAGEAL DILATION
Anesthesia: Moderate Sedation

## 2012-03-22 MED ORDER — MEPERIDINE HCL 25 MG/ML IJ SOLN
INTRAMUSCULAR | Status: DC | PRN
  Administered 2012-03-22: 15 mg via INTRAVENOUS
  Administered 2012-03-22: 10 mg via INTRAVENOUS

## 2012-03-22 MED ORDER — MIDAZOLAM HCL 5 MG/5ML IJ SOLN
INTRAMUSCULAR | Status: AC
  Filled 2012-03-22: qty 10

## 2012-03-22 MED ORDER — MEPERIDINE HCL 50 MG/ML IJ SOLN
INTRAMUSCULAR | Status: AC
  Filled 2012-03-22: qty 1

## 2012-03-22 MED ORDER — BUTAMBEN-TETRACAINE-BENZOCAINE 2-2-14 % EX AERO
INHALATION_SPRAY | CUTANEOUS | Status: DC | PRN
  Administered 2012-03-22: 2 via TOPICAL

## 2012-03-22 MED ORDER — MIDAZOLAM HCL 5 MG/5ML IJ SOLN
INTRAMUSCULAR | Status: DC | PRN
  Administered 2012-03-22: 2 mg via INTRAVENOUS

## 2012-03-22 MED ORDER — STERILE WATER FOR IRRIGATION IR SOLN
Status: DC | PRN
  Administered 2012-03-22: 07:00:00

## 2012-03-22 MED ORDER — SODIUM CHLORIDE 0.45 % IV SOLN: INTRAVENOUS | Status: DC

## 2012-03-22 NOTE — Op Note (Signed)
EGD PROCEDURE REPORT  PATIENT:  Kaitlyn Cooper  MR#:  [id number] Birthdate:  September 04, 1926, 79 y.o., female Endoscopist:  Dr. Malissa Hippo, MD Referred By:  Dr. [name], MD Procedure Date: 03/22/2012  Procedure:   EGD with ED(balloon).  Indications:  Patient is 79 year old Caucasian female who presents with one-year history of intermittent dysphagia. She has history of his aphasia ring which was dilated back in January 2011. She had barium pill esophagogram 2 weeks ago revealing esophageal diverticula changes of esophageal motility disorder as well as distal esophageal stricture rendering passage of 13 mm barium pill.            Informed Consent:  The risks, benefits, alternatives & imponderables which include, but are not limited to, bleeding, infection, perforation, drug reaction and potential missed lesion have been reviewed.  The potential for biopsy, lesion removal, esophageal dilation, etc. have also been discussed.  Questions have been answered.  All parties agreeable.  Please see history & physical in medical record for more information.  Medications:  Demerol 25 mg IV Versed 2 mg IV Cetacaine spray topically for oropharyngeal anesthesia  Description of procedure:  The endoscope was introduced through the mouth and advanced to the second portion of the duodenum without difficulty or limitations. The mucosal surfaces were surveyed very carefully during advancement of the scope and upon withdrawal.  Findings:  Esophagus:  Mucosa of the esophagus was normal with 2 small diverticula at 31 cm from the incisors. Prominent ring noted at GE junction. GEJ:  35 cm Hiatus:  39 cm Stomach:  Stomach was empty and distended very well with insufflation. Folds in the proximal stomach were normal. Examination of mucosa at body, antrum, pyloric channel as well as angularis was normal. Single small AV malformation noted involving fundal mucosa seen on retroflexed view. Hernia was also seen on this  view. Duodenum:  Normal bulbar and post bulbar mucosa.  Therapeutic/Diagnostic Maneuvers Performed:  Balloon dilator was used since patient has esophageal diverticula. Balloon dilator was passed through the scope. Guidewire was pushed into gastric lumen. Balloon dilator was positioned across GE junction and insufflated to a diameter of 18 mm and maintained for few seconds. Ring was disrupted. GE junction was further dilated to 19 mm. As the balloon was withdrawn. Endoscope was passed again the segment without any resistance. Endoscope was withdrawn.  Complications:  None  Impression: Two small  diverticula noted at esophageal body. High grade Schatzki's ring disrupted with  balloon dilation to 19 mm. Moderate size sliding hiatal hernia. Small AV malformation at gastric fundus without stigmata of bleed.  Recommendations:  Standard instructions given. Patient instructed to chew her food thoroughly and he slowly. Patient will call office with progress report in one week.  REHMAN,NAJEEB U  03/22/2012  8:05 AM  CC: Dr. [name], MD & Dr. Bonnetta Barry ref. provider found

## 2012-03-22 NOTE — H&P (Signed)
Kaitlyn Cooper is an 79 y.o. female.   Chief Complaint: Patient is here for EGD and ED. HPI: Patient is a 42 -year-old Caucasian female who presents with one-year history of intermittent dysphagia to solids. She had her esophagus dilated in January 2011 when she was found to have high grade Schatzki's ring. For 3 weeks ago she had barium pill esophagogram revealing esophageal stricture as well as small diverticula and changes suggesting esophageal motility disorder. She denies heartburn. She has lost few pounds recently because of poor appetite. Patient denies abdominal pain melena or rectal bleeding.  Past Medical History  Diagnosis Date  . Anemia     followed by Dr.Neijstrom  . Hyperlipidemia   . Depression   . Hypothyroidism   . History of atherosclerotic cardiovascular disease   . History of colonoscopy     02/2002 showed few diverticula at sigmoid colon otherwise normal colonoscopy and terminal ileoscopy 1995 several colon polyps ,tubular adenoma  . GERD (gastroesophageal reflux disease)     chronic  . GERD (gastroesophageal reflux disease)     chronic gerd  . Tiredness     Past Surgical History  Procedure Date  . Insert / replace / remove pacemaker 01/17/2009    St.Jude Cardiac Pacemaker   . Cardiac catheterization 1998  . Cholecystectomy   . Temporal carcinoma 2004    excision of right temporal carcinoma     History reviewed. No pertinent family history. Social History:  reports that she quit smoking about 40 years ago. She has never used smokeless tobacco. She reports that she does not drink alcohol or use illicit drugs.  Allergies:  Allergies  Allergen Reactions  . Penicillins Rash    Medications Prior to Admission  Medication Sig Dispense Refill  . levothyroxine (SYNTHROID, LEVOTHROID) 75 MCG tablet Take 75 mcg by mouth daily.        Marland Kitchen lovastatin (MEVACOR) 20 MG tablet Take 20 mg by mouth at bedtime.        . ranitidine (ZANTAC) 150 MG tablet Take 150 mg by mouth  daily. Takes 1 daily and may take an additional pill if needed      . sertraline (ZOLOFT) 100 MG tablet Take 100 mg by mouth daily.        Marland Kitchen acetaminophen (TYLENOL) 500 MG tablet Take 500 mg by mouth every 6 (six) hours as needed. Pain      . Cholecalciferol (VITAMIN D) 400 UNITS capsule Take 400 Units by mouth daily.        . cyanocobalamin (,VITAMIN B-12,) 1000 MCG/ML injection Inject 1,000 mcg into the muscle every 30 (thirty) days.        No results found for this or any previous visit (from the past 48 hour(s)). No results found.  ROS  Blood pressure 150/50, pulse 67, temperature 97.9 F (36.6 C), temperature source Oral, resp. rate 16, SpO2 93.00%. Physical Exam  Constitutional: She appears well-developed.       Thin Caucasian female in NAD  HENT:  Mouth/Throat: Oropharynx is clear and moist.       Few teeth are missing rest in satisfactory condition  Eyes: Conjunctivae normal are normal. No scleral icterus.  Neck: No thyromegaly present.  Cardiovascular: Normal rate, regular rhythm and normal heart sounds.   No murmur heard. Respiratory: Effort normal and breath sounds normal.  GI: Soft. She exhibits no distension and no mass. There is no tenderness.  Musculoskeletal: She exhibits no edema.  Lymphadenopathy:    She has no  cervical adenopathy.  Neurological: She is alert.  Skin: Skin is warm and dry.     Assessment/Plan Dysphagia. Abnormal BPE revealing distal esophageal stricture along with changes of EMD. Suspect dysphagia from the distal esophageal stricture since barium pill could not pass this segment. EGD with ED.  REHMAN,NAJEEB U 03/22/2012, 7:42 AM

## 2012-03-24 ENCOUNTER — Encounter (INDEPENDENT_AMBULATORY_CARE_PROVIDER_SITE_OTHER): Payer: Self-pay

## 2012-03-24 ENCOUNTER — Encounter (HOSPITAL_COMMUNITY): Payer: Self-pay | Admitting: Internal Medicine

## 2012-04-14 ENCOUNTER — Other Ambulatory Visit (HOSPITAL_COMMUNITY): Payer: Self-pay | Admitting: Rheumatology

## 2012-04-14 ENCOUNTER — Ambulatory Visit (HOSPITAL_COMMUNITY)
Admission: RE | Admit: 2012-04-14 | Discharge: 2012-04-14 | Disposition: A | Discharge status: Home / Self Care | Payer: Medicare Other | Source: Ambulatory Visit | Attending: Rheumatology | Admitting: Rheumatology

## 2012-04-14 DIAGNOSIS — R634 Abnormal weight loss: Secondary | ICD-10-CM

## 2012-04-14 DIAGNOSIS — Z87891 Personal history of nicotine dependence: Secondary | ICD-10-CM

## 2012-04-14 DIAGNOSIS — R0602 Shortness of breath: Secondary | ICD-10-CM

## 2012-05-14 ENCOUNTER — Encounter: Payer: Self-pay | Admitting: *Deleted

## 2012-05-17 ENCOUNTER — Ambulatory Visit (INDEPENDENT_AMBULATORY_CARE_PROVIDER_SITE_OTHER): Payer: Medicare Other | Admitting: Family Medicine

## 2012-05-17 ENCOUNTER — Encounter: Payer: Self-pay | Admitting: Family Medicine

## 2012-05-17 VITALS — BP 112/70 | HR 70 | Ht 63.0 in | Wt 107.4 lb

## 2012-05-17 DIAGNOSIS — E039 Hypothyroidism, unspecified: Secondary | ICD-10-CM

## 2012-05-17 DIAGNOSIS — D649 Anemia, unspecified: Secondary | ICD-10-CM

## 2012-05-17 DIAGNOSIS — D510 Vitamin B12 deficiency anemia due to intrinsic factor deficiency: Secondary | ICD-10-CM | POA: Insufficient documentation

## 2012-05-17 DIAGNOSIS — E785 Hyperlipidemia, unspecified: Secondary | ICD-10-CM

## 2012-05-17 DIAGNOSIS — I10 Essential (primary) hypertension: Secondary | ICD-10-CM

## 2012-05-17 MED ORDER — CYANOCOBALAMIN 1000 MCG/ML IJ SOLN
1000.0000 ug | Freq: Once | INTRAMUSCULAR | Status: AC
  Administered 2012-05-17: 1000 ug via INTRAMUSCULAR

## 2012-05-17 NOTE — Patient Instructions (Signed)
Continue your medications as is  No labs  Monthly b12 shot  Follow up 4 months sooner if problems

## 2012-05-17 NOTE — Progress Notes (Signed)
Subjective:    Patient ID: Kaitlyn Cooper, female    DOB: [birth year], 79 y.o.   MRN: [id number]  Hyperlipidemia This is a chronic problem. The current episode started more than 1 year ago. The problem is controlled. Recent lipid tests were reviewed and are normal. She has no history of diabetes or obesity. There are no known factors aggravating her hyperlipidemia. Pertinent negatives include no chest pain, focal weakness or shortness of breath. Current antihyperlipidemic treatment includes statins. The current treatment provides moderate improvement of lipids. There are no compliance problems.  Risk factors for coronary artery disease include dyslipidemia.   This patient also has problems with hypertension she is taking all of her medicines correctly. She denies headaches chest pain or swelling in the legs. She tries to eat relatively healthy. Patient also using her levothyroxine states her energy level is fair she knows to adjust her pace because of her age She also has history of anemia she seen Dr. Laurie Panda before who felt that this was a chronic anemia she also has pernicious anemia and determine monthly B12 She is also been struggling with her weight but it's 107 today which is close to what it was   Review of Systems  Constitutional: Negative for activity change, appetite change and fatigue.  HENT: Negative for congestion, rhinorrhea, neck pain and ear discharge.   Eyes: Negative for discharge.  Respiratory: Negative for cough, chest tightness, shortness of breath and wheezing.   Cardiovascular: Negative for chest pain.  Gastrointestinal: Negative for vomiting and abdominal pain.  Genitourinary: Negative for frequency and difficulty urinating.  Allergic/Immunologic: Negative for environmental allergies and food allergies.  Neurological: Negative for focal weakness, weakness and headaches.  Psychiatric/Behavioral: Negative for behavioral problems and agitation.   She relates no problems  with food getting stuck.   past medical history family history social and dietary were all reviewed Objective:   Physical Exam  Constitutional: She is oriented to person, place, and time. She appears well-developed and well-nourished.  HENT:  Head: Normocephalic.  Right Ear: External ear normal.  Left Ear: External ear normal.  Eyes: Pupils are equal, round, and reactive to light.  Neck: Normal range of motion. No thyromegaly present.  Cardiovascular: Normal rate, regular rhythm, normal heart sounds and intact distal pulses.   No murmur heard. Pulmonary/Chest: Effort normal and breath sounds normal. No respiratory distress. She has no wheezes.  Abdominal: Soft. Bowel sounds are normal. She exhibits no distension and no mass. There is no tenderness.  Musculoskeletal: Normal range of motion. She exhibits no edema and no tenderness.  Lymphadenopathy:    She has no cervical adenopathy.  Neurological: She is alert and oriented to person, place, and time. She exhibits normal muscle tone.  Skin: Skin is warm and dry.  Psychiatric: She has a normal mood and affect. Her behavior is normal.          Assessment & Plan:  Anemia - Plan: cyanocobalamin ((VITAMIN B-12)) injection 1,000 mcg  Essential hypertension, benign  HYPOTHYROIDISM  HYPERLIPIDEMIA  Pernicious anemia  As for her anemia it is felt that this is a chronic issue. She is on monthly B12 shots for pernicious anemia. She has seen hematology before and no further testing or treatments were indicated. She also recently had EGD to stretch his esophagus this has successfully helped her problem with dysphagia She is taking her thyroid medicine recent lab work looked good she relates her energy level is fair she does some activity then she will rest  then she does more She tries the healthy but she is taking ice cream daily which I think is fine for the extra calories to help keep her weight up. Recently lipidemia lab work looked good  no need to repeat it. Blood pressure looks good today continue as is. Followup in 4 months for office visit followup monthly B12

## 2012-05-27 ENCOUNTER — Encounter: Payer: Self-pay | Admitting: Adult Health

## 2012-05-27 ENCOUNTER — Ambulatory Visit (INDEPENDENT_AMBULATORY_CARE_PROVIDER_SITE_OTHER): Payer: Medicare Other | Admitting: Adult Health

## 2012-05-27 VITALS — BP 128/62 | HR 64 | Ht 63.0 in | Wt 108.0 lb

## 2012-05-27 DIAGNOSIS — I951 Orthostatic hypotension: Secondary | ICD-10-CM

## 2012-05-27 DIAGNOSIS — Z95810 Presence of automatic (implantable) cardiac defibrillator: Secondary | ICD-10-CM

## 2012-05-27 DIAGNOSIS — I10 Essential (primary) hypertension: Secondary | ICD-10-CM

## 2012-05-27 NOTE — Assessment & Plan Note (Addendum)
She is definitely very orthostatic with blood pressure dropping from 108 systolic to 77/44. However this may be due to some mild dehydration in association with diarrhea. I will give her 500 cc of IV fluids prior to leaving the office today. I want her to followup with Dr. Gerda Diss next week. I will have a BMET and a CBC completed to evaluate kidney function potassium status and for anemia. She is not on antihypertensives at this time. If she continues watery diarrhea, I have advised her to seek treatment sooner than being seen by Dr. Jorene Minors next week, by going to ER if necessary. I have given her a specimen cup to place a sample of stool for evaluation for ova and parasites and C. Difficile.  Followup blood pressure after IV hydration 128/72. We will followup with her in a couple of weeks, but she should follow with her primary care physician and GI at his discretion.

## 2012-05-27 NOTE — Progress Notes (Deleted)
Name: Kaitlyn Cooper    DOB: [birth year]  Age: 79 y.o.  MR#: [id number]       PCP:  [name], MD      Insurance: Payor: MEDICARE  Plan: MEDICARE PART A AND B  Product Type: *No Product type*    CC:    Chief Complaint  Patient presents with  . Hypertension  . Diarrhea  . Dizziness    VS Filed Vitals:   05/27/12 1459  BP: 94/48  Pulse: 60  Height: 5\' 3"  (1.6 m)  Weight: 108 lb (48.988 kg)    Weights Current Weight  05/27/12 108 lb (48.988 kg)  05/17/12 107 lb 6.4 oz (48.716 kg)  03/16/12 108 lb 4.8 oz (49.125 kg)    Blood Pressure  BP Readings from Last 3 Encounters:  05/27/12 94/48  05/17/12 112/70  03/22/12 120/53     Admit date:  (Not on file) Last encounter with RMR:  Visit date not found   Allergy Penicillins  Current Outpatient Prescriptions  Medication Sig Dispense Refill  . acetaminophen (TYLENOL) 500 MG tablet Take 500 mg by mouth every 6 (six) hours as needed. Pain      . Cholecalciferol (VITAMIN D) 400 UNITS capsule Take 400 Units by mouth daily.        . cyanocobalamin (,VITAMIN B-12,) 1000 MCG/ML injection Inject 1,000 mcg into the muscle every 30 (thirty) days.      Marland Kitchen levothyroxine (SYNTHROID, LEVOTHROID) 75 MCG tablet Take 75 mcg by mouth daily.        Marland Kitchen lovastatin (MEVACOR) 40 MG tablet Take 40 mg by mouth at bedtime.      . ranitidine (ZANTAC) 150 MG tablet Take 150 mg by mouth daily. Takes 1 daily and may take an additional pill if needed      . sertraline (ZOLOFT) 100 MG tablet Take 100 mg by mouth daily.         No current facility-administered medications for this visit.    Discontinued Meds:   There are no discontinued medications.  Patient Active Problem List  Diagnosis  . COLONIC POLYPS, ADENOMATOUS  . HYPOTHYROIDISM  . HYPERLIPIDEMIA  . ANEMIA  . DEPRESSION  . ESSENTIAL HYPERTENSION, BENIGN  . BRADYCARDIA  . ATHEROSCLEROTIC CARDIOVASCULAR DISEASE  . GASTROESOPHAGEAL REFLUX DISEASE, CHRONIC  . OSTEOPOROSIS  . OSTEOPENIA  .  DYSPHAGIA  . DIARRHEA, CHRONIC  . DEPRESSION, HX OF  . COLONIC POLYPS, HX OF  . PPM-St.Jude  . Esophageal stricture  . Family hx of colon cancer  . Pernicious anemia    LABS    Component Value Date/Time   NA 140 04/11/2011 1003   NA 136 03/07/2011 1205   NA 137 01/17/2011 1246   K 3.7 04/11/2011 1003   K 3.8 03/07/2011 1205   K 4.0 01/17/2011 1246   CL 104 04/11/2011 1003   CL 100 03/07/2011 1205   CL 102 01/17/2011 1246   CO2 26 04/11/2011 1003   CO2 27 03/07/2011 1205   CO2 25 01/17/2011 1246   GLUCOSE 107* 04/11/2011 1003   GLUCOSE 126* 03/07/2011 1205   GLUCOSE 115* 01/17/2011 1246   BUN 28* 04/11/2011 1003   BUN 29* 03/07/2011 1205   BUN 23 01/17/2011 1246   CREATININE 0.98 04/11/2011 1003   CREATININE 1.12* 03/07/2011 1205   CREATININE 0.98 01/17/2011 1246   CREATININE 1.19 08/29/2008 0934   CALCIUM 8.8 04/11/2011 1003   CALCIUM 9.5 03/07/2011 1205   CALCIUM 9.6 01/17/2011 1246   GFRNONAA 52* 04/11/2011  1003   GFRNONAA 44* 03/07/2011 1205   GFRNONAA 52* 01/17/2011 1246   GFRAA 61* 04/11/2011 1003   GFRAA 52* 03/07/2011 1205   GFRAA 61* 01/17/2011 1246   CMP     Component Value Date/Time   NA 140 04/11/2011 1003   K 3.7 04/11/2011 1003   CL 104 04/11/2011 1003   CO2 26 04/11/2011 1003   GLUCOSE 107* 04/11/2011 1003   BUN 28* 04/11/2011 1003   CREATININE 0.98 04/11/2011 1003   CREATININE 1.19 08/29/2008 0934   CALCIUM 8.8 04/11/2011 1003   PROT 7.3 03/07/2011 1205   ALBUMIN 3.3* 03/07/2011 1205   AST 16 03/07/2011 1205   ALT 10 03/07/2011 1205   ALKPHOS 42 03/07/2011 1205   BILITOT 0.3 03/07/2011 1205   GFRNONAA 52* 04/11/2011 1003   GFRAA 61* 04/11/2011 1003       Component Value Date/Time   WBC 9.6 08/25/2011 1033   WBC 8.8 06/03/2011 0922   WBC 10.9* 04/11/2011 1003   HGB 11.3* 08/25/2011 1033   HGB 10.7* 06/03/2011 0922   HGB 11.6* 04/11/2011 1003   HCT 34.8* 08/25/2011 1033   HCT 34.0* 06/03/2011 0922   HCT 35.8* 04/11/2011 1003   MCV 93.0 08/25/2011 1033   MCV 93.7 06/03/2011 0922    MCV 94.5 04/11/2011 1003    Lipid Panel  No results found for this basename: chol, trig, hdl, cholhdl, vldl, ldlcalc    ABG No results found for this basename: phart, pco2, pco2art, po2, po2art, hco3, tco2, acidbasedef, o2sat     No results found for this basename: TSH   BNP (last 3 results) No results found for this basename: PROBNP,  in the last 8760 hours Cardiac Panel (last 3 results) No results found for this basename: CKTOTAL, CKMB, TROPONINI, RELINDX,  in the last 72 hours  Iron/TIBC/Ferritin    Component Value Date/Time   IRON 89 10/28/2010 1454   TIBC 403 10/28/2010 1454   FERRITIN 116 10/28/2010 1454     EKG Orders placed in visit on 08/27/11  . EKG 12-LEAD     Prior Assessment and Plan Problem List as of 05/27/2012     ICD-9-CM     Cardiology Problems   HYPERLIPIDEMIA   Last Assessment & Plan   06/04/2010 Office Visit Written 06/04/2010  9:07 PM by Marinus Maw, MD     Continue a low fat diet and statin therapy.    ESSENTIAL HYPERTENSION, BENIGN   Last Assessment & Plan   08/25/2011 Office Visit Written 08/25/2011  4:51 PM by Marinus Maw, MD     Her blood pressure is well controlled. Will recheck in several months.    BRADYCARDIA   ATHEROSCLEROTIC CARDIOVASCULAR DISEASE     Other   Pernicious anemia   COLONIC POLYPS, ADENOMATOUS   HYPOTHYROIDISM   ANEMIA   DEPRESSION   GASTROESOPHAGEAL REFLUX DISEASE, CHRONIC   OSTEOPOROSIS   OSTEOPENIA   DYSPHAGIA   DIARRHEA, CHRONIC   DEPRESSION, HX OF   COLONIC POLYPS, HX OF   PPM-St.Jude   Last Assessment & Plan   08/25/2011 Office Visit Written 08/25/2011  4:52 PM by Marinus Maw, MD     Her device is working normally. Will recheck in several months.    Esophageal stricture   Family hx of colon cancer       Imaging: No results found.

## 2012-05-27 NOTE — Assessment & Plan Note (Signed)
A St. Jude's pacemaker in situ. She is due to have her pacemaker interrogated with office visit with Dr. Ladona Ridgel for annual evaluation. Limit will be made prior to her leaving the office.

## 2012-05-27 NOTE — Assessment & Plan Note (Signed)
Recommend followup with primary care physician or GI in continued symptoms to avoid dehydration.

## 2012-05-27 NOTE — Patient Instructions (Addendum)
You will need lab work today at [facility] lab across the street from our office: [address], [address]  While at the lab they will give you a container to get a stool sample in & bring back to them  Follow-up with Dr. Gerda Diss next week about your diarrhea  Follow-up with Dr. Ladona Ridgel in June

## 2012-05-27 NOTE — Progress Notes (Signed)
HPI: [name] is an 79 year old patient of Dr. Gilman Cooper that we follow for ongoing assessment and treatment of bradycardia status post St. Jude's pacemaker implantation. She has been having complaints of recurrent dizziness, frequent watery stools, and positional presyncope. She has a history of chronic diarrhea and has been followed by Dr. Dionicia Cooper but has not seen him in several months. She is recently been seen by her primary care physician with recent lab work. She comes today with generalized fatigue. Orthostatic vital signs are requested prior to me seeing her.  Allergies  Allergen Reactions  . Penicillins Rash    Current Outpatient Prescriptions  Medication Sig Dispense Refill  . acetaminophen (TYLENOL) 500 MG tablet Take 500 mg by mouth every 6 (six) hours as needed. Pain      . Cholecalciferol (VITAMIN D) 400 UNITS capsule Take 400 Units by mouth daily.        . cyanocobalamin (,VITAMIN B-12,) 1000 MCG/ML injection Inject 1,000 mcg into the muscle every 30 (thirty) days.      Marland Kitchen levothyroxine (SYNTHROID, LEVOTHROID) 75 MCG tablet Take 75 mcg by mouth daily.        Marland Kitchen lovastatin (MEVACOR) 40 MG tablet Take 40 mg by mouth at bedtime.      . ranitidine (ZANTAC) 150 MG tablet Take 150 mg by mouth daily. Takes 1 daily and may take an additional pill if needed      . sertraline (ZOLOFT) 100 MG tablet Take 100 mg by mouth daily.         No current facility-administered medications for this visit.    Past Medical History  Diagnosis Date  . Anemia     followed by Dr.Neijstrom  . Hyperlipidemia   . Depression   . Hypothyroidism   . History of atherosclerotic cardiovascular disease   . History of colonoscopy     02/2002 showed few diverticula at sigmoid colon otherwise normal colonoscopy and terminal ileoscopy 1995 several colon polyps ,tubular adenoma  . GERD (gastroesophageal reflux disease)     chronic  . GERD (gastroesophageal reflux disease)     chronic gerd  .  Tiredness     Past Surgical History  Procedure Laterality Date  . Insert / replace / remove pacemaker  01/17/2009    St.Jude Cardiac Pacemaker   . Cardiac catheterization  1998  . Cholecystectomy    . 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 Esophagogastroduodenoscopy (egd) with esophageal dilation  03/22/2012    Procedure: ESOPHAGOGASTRODUODENOSCOPY (EGD) WITH ESOPHAGEAL DILATION;  Surgeon: Malissa Hippo, MD;  Location: [facility];  Service: Endoscopy;  Laterality: N/A;  730    ZOX:WRUEAV of systems complete and found to be negative unless listed above  PHYSICAL EXAM BP 128/62  Pulse 64  Ht 5\' 3"  (1.6 m)  Wt 108 lb (48.988 kg)  BMI 19.14 kg/m2 General: Well developed, well nourished, in no acute distress Head: Eyes PERRLA, No xanthomas.   Normal cephalic and atramatic  Lungs: Clear bilaterally to auscultation and percussion. Heart: HRRR S1 S2, without MRG.  Pulses are 2+ & equal.            No carotid bruit. No JVD.  No abdominal bruits. No femoral bruits. Abdomen: Bowel sounds are positive, abdomen soft and non-tender without masses or                  Hernia's noted. Msk:  Back normal, normal gait. Normal  strength and tone for age. Extremities: No clubbing, cyanosis or edema.  DP +1 Neuro: Alert and oriented X 3. Psych:  Good affect, responds appropriately  ZOX:WRUEA   ASSESSMENT AND PLAN

## 2012-05-28 LAB — CBC WITH DIFFERENTIAL/PLATELET
Basophils Absolute: 0 10*3/uL (ref 0.0–0.1)
Eosinophils Absolute: 0.2 10*3/uL (ref 0.0–0.7)
Eosinophils Relative: 2 % (ref 0–5)
HCT: 31.4 % — ABNORMAL LOW (ref 36.0–46.0)
Lymphocytes Relative: 26 % (ref 12–46)
MCH: 29.4 pg (ref 26.0–34.0)
MCHC: 32.2 g/dL (ref 30.0–36.0)
MCV: 91.3 fL (ref 78.0–100.0)
Monocytes Absolute: 1 10*3/uL (ref 0.1–1.0)
RDW: 14.1 % (ref 11.5–15.5)
WBC: 8.4 10*3/uL (ref 4.0–10.5)

## 2012-05-28 LAB — BASIC METABOLIC PANEL
CO2: 21 mEq/L (ref 19–32)
Chloride: 107 mEq/L (ref 96–112)
Glucose, Bld: 82 mg/dL (ref 70–99)
Potassium: 4.9 mEq/L (ref 3.5–5.3)
Sodium: 138 mEq/L (ref 135–145)

## 2012-06-17 ENCOUNTER — Ambulatory Visit: Payer: Medicare Other

## 2012-06-18 ENCOUNTER — Ambulatory Visit (INDEPENDENT_AMBULATORY_CARE_PROVIDER_SITE_OTHER): Payer: Medicare Other | Admitting: *Deleted

## 2012-06-18 DIAGNOSIS — D510 Vitamin B12 deficiency anemia due to intrinsic factor deficiency: Secondary | ICD-10-CM

## 2012-06-18 MED ORDER — CYANOCOBALAMIN 1000 MCG/ML IJ SOLN
1000.0000 ug | Freq: Once | INTRAMUSCULAR | Status: AC
  Administered 2012-06-18: 1000 ug via INTRAMUSCULAR

## 2012-07-19 ENCOUNTER — Encounter: Payer: Self-pay | Admitting: Internal Medicine

## 2012-07-19 ENCOUNTER — Other Ambulatory Visit: Payer: Self-pay | Admitting: Family Medicine

## 2012-07-19 ENCOUNTER — Ambulatory Visit (INDEPENDENT_AMBULATORY_CARE_PROVIDER_SITE_OTHER): Payer: Medicare Other | Admitting: Internal Medicine

## 2012-07-19 VITALS — BP 106/48 | HR 75 | Wt 105.0 lb

## 2012-07-19 DIAGNOSIS — Z950 Presence of cardiac pacemaker: Secondary | ICD-10-CM

## 2012-07-19 DIAGNOSIS — I498 Other specified cardiac arrhythmias: Secondary | ICD-10-CM

## 2012-07-19 DIAGNOSIS — I10 Essential (primary) hypertension: Secondary | ICD-10-CM

## 2012-07-19 LAB — PACEMAKER DEVICE OBSERVATION
AL AMPLITUDE: 5 mv
AL IMPEDENCE PM: 400 Ohm
BAMS-0003: 70 {beats}/min
BATTERY VOLTAGE: 2.9478 V
RV LEAD AMPLITUDE: 12 mv

## 2012-07-19 NOTE — Assessment & Plan Note (Signed)
Her St. Jude DDD PPM is working normally. Will recheck in several months. 

## 2012-07-19 NOTE — Assessment & Plan Note (Addendum)
Her blood pressure is well controlled. Will continue current medical therapy.

## 2012-07-19 NOTE — Patient Instructions (Addendum)
Your physician wants you to follow-up in: 6 MONTHS with Gunnar Fusi for Device Check.  You will receive a reminder letter in the mail two months in advance. If you don't receive a letter, please call our office to schedule the follow-up appointment.  Your physician wants you to follow-up in: 1 YEAR with Dr Ladona Ridgel.  You will receive a reminder letter in the mail two months in advance. If you don't receive a letter, please call our office to schedule the follow-up appointment.  Your physician recommends that you continue on your current medications as directed. Please refer to the Current Medication list given to you today.

## 2012-07-19 NOTE — Progress Notes (Signed)
HPI Mrs. Hanisch returns today for followup. She is a pleasant 79 yo woman with a h/o symptomatic bradycardia due to sinus node dysfunction, and chronic diarrhea. No chest pain. She has weakness and admits to a sedentary life style. No syncope.  Allergies  Allergen Reactions  . Penicillins Rash     Current Outpatient Prescriptions  Medication Sig Dispense Refill  . acetaminophen (TYLENOL) 500 MG tablet Take 500 mg by mouth every 6 (six) hours as needed. Pain      . Cholecalciferol (VITAMIN D) 400 UNITS capsule Take 400 Units by mouth daily.        . cyanocobalamin (,VITAMIN B-12,) 1000 MCG/ML injection Inject 1,000 mcg into the muscle every 30 (thirty) days.      Marland Kitchen levothyroxine (SYNTHROID, LEVOTHROID) 75 MCG tablet Take 75 mcg by mouth daily.        Marland Kitchen lovastatin (MEVACOR) 40 MG tablet Take 40 mg by mouth at bedtime.      . ranitidine (ZANTAC) 150 MG tablet Take 150 mg by mouth daily. Takes 1 daily and may take an additional pill if needed      . sertraline (ZOLOFT) 100 MG tablet Take 100 mg by mouth daily.         No current facility-administered medications for this visit.     Past Medical History  Diagnosis Date  . Anemia     followed by Dr.Neijstrom  . Hyperlipidemia   . Depression   . Hypothyroidism   . History of atherosclerotic cardiovascular disease   . History of colonoscopy     02/2002 showed few diverticula at sigmoid colon otherwise normal colonoscopy and terminal ileoscopy 1995 several colon polyps ,tubular adenoma  . GERD (gastroesophageal reflux disease)     chronic  . GERD (gastroesophageal reflux disease)     chronic gerd  . Tiredness     ROS:   All systems reviewed and negative except as noted in the HPI.   Past Surgical History  Procedure Laterality Date  . Insert / replace / remove pacemaker  01/17/2009    St.Jude Cardiac Pacemaker   . Cardiac catheterization  1998  . Cholecystectomy    . Temporal carcinoma  2004    excision of right temporal  carcinoma   . Esophagogastroduodenoscopy (egd) with esophageal dilation  03/22/2012    Procedure: ESOPHAGOGASTRODUODENOSCOPY (EGD) WITH ESOPHAGEAL DILATION;  Surgeon: Malissa Hippo, MD;  Location: [facility];  Service: Endoscopy;  Laterality: N/A;  730     History reviewed. No pertinent family history.   History   Social History  . Marital Status: Widowed    Spouse Name: N/A    Number of Children: N/A  . Years of Education: N/A   Occupational History  . Not on file.   Social History Main Topics  . Smoking status: Former Smoker -- 1.00 packs/day for 40 years    Quit date: 08/25/1971  . Smokeless tobacco: Never Used  . Alcohol Use: No  . Drug Use: No  . Sexually Active: Not on file   Other Topics Concern  . Not on file   Social History Narrative  . No narrative on file    BP - 106/45, P- 75, R 16  Physical Exam:  Well appearing elderly woman, NAD HEENT: Unremarkable Neck:  No JVD, no thyromegally Lymphatics:  No adenopathy Back:  No CVA tenderness Lungs:  Clear with no wheezes, rales, or rhonchi HEART:  Regular rate rhythm, no murmurs, no rubs, no clicks Abd:  soft, positive bowel sounds, no organomegally, no rebound, no guarding Ext:  2 plus pulses, no edema, no cyanosis, no clubbing Skin:  No rashes no nodules Neuro:  CN II through XII intact, motor grossly intact   DEVICE  Normal device function.  See PaceArt for details.   Assess/Plan:

## 2012-07-20 ENCOUNTER — Ambulatory Visit (INDEPENDENT_AMBULATORY_CARE_PROVIDER_SITE_OTHER): Payer: Medicare Other

## 2012-07-20 DIAGNOSIS — D649 Anemia, unspecified: Secondary | ICD-10-CM

## 2012-07-20 MED ORDER — CYANOCOBALAMIN 1000 MCG/ML IJ SOLN
1000.0000 ug | Freq: Once | INTRAMUSCULAR | Status: AC
  Administered 2012-07-20: 1000 ug via INTRAMUSCULAR

## 2012-08-19 ENCOUNTER — Ambulatory Visit (INDEPENDENT_AMBULATORY_CARE_PROVIDER_SITE_OTHER): Payer: Medicare Other

## 2012-08-19 DIAGNOSIS — D649 Anemia, unspecified: Secondary | ICD-10-CM

## 2012-08-19 MED ORDER — CYANOCOBALAMIN 1000 MCG/ML IJ SOLN
1000.0000 ug | Freq: Once | INTRAMUSCULAR | Status: AC
  Administered 2012-08-19: 1000 ug via INTRAMUSCULAR

## 2012-09-21 ENCOUNTER — Ambulatory Visit (INDEPENDENT_AMBULATORY_CARE_PROVIDER_SITE_OTHER): Payer: Medicare Other | Admitting: *Deleted

## 2012-09-21 DIAGNOSIS — D510 Vitamin B12 deficiency anemia due to intrinsic factor deficiency: Secondary | ICD-10-CM

## 2012-09-21 MED ORDER — CYANOCOBALAMIN 1000 MCG/ML IJ SOLN
1000.0000 ug | Freq: Once | INTRAMUSCULAR | Status: AC
  Administered 2012-09-21: 1000 ug via INTRAMUSCULAR

## 2012-10-26 ENCOUNTER — Ambulatory Visit: Payer: Medicare Other

## 2012-10-27 ENCOUNTER — Ambulatory Visit (INDEPENDENT_AMBULATORY_CARE_PROVIDER_SITE_OTHER): Payer: Medicare Other | Admitting: *Deleted

## 2012-10-27 DIAGNOSIS — D649 Anemia, unspecified: Secondary | ICD-10-CM

## 2012-10-27 MED ORDER — CYANOCOBALAMIN 1000 MCG/ML IJ SOLN
1000.0000 ug | Freq: Once | INTRAMUSCULAR | Status: AC
  Administered 2012-10-27: 1000 ug via INTRAMUSCULAR

## 2012-11-25 ENCOUNTER — Ambulatory Visit (INDEPENDENT_AMBULATORY_CARE_PROVIDER_SITE_OTHER): Payer: Medicare Other | Admitting: Family Medicine

## 2012-11-25 ENCOUNTER — Encounter: Payer: Self-pay | Admitting: Family Medicine

## 2012-11-25 VITALS — BP 108/52 | Ht 63.0 in | Wt 107.8 lb

## 2012-11-25 DIAGNOSIS — E039 Hypothyroidism, unspecified: Secondary | ICD-10-CM

## 2012-11-25 DIAGNOSIS — E785 Hyperlipidemia, unspecified: Secondary | ICD-10-CM

## 2012-11-25 DIAGNOSIS — Z79899 Other long term (current) drug therapy: Secondary | ICD-10-CM

## 2012-11-25 DIAGNOSIS — D649 Anemia, unspecified: Secondary | ICD-10-CM

## 2012-11-25 DIAGNOSIS — D510 Vitamin B12 deficiency anemia due to intrinsic factor deficiency: Secondary | ICD-10-CM

## 2012-11-25 DIAGNOSIS — Z23 Encounter for immunization: Secondary | ICD-10-CM

## 2012-11-25 NOTE — Progress Notes (Signed)
  Subjective:    Patient ID: Kaitlyn Cooper, female    DOB: [birth year], 79 y.o.   MRN: [id number]  HPI Patient arrives with complaint of weakness and fatigue for several months. This fatigue and weakness causes her to get out when she tries to do things she denies chest tightness pressure pain she just relates a lot of fatigue and tiredness she also states low energy low appetite she is holding her weight she denies rectal bleeding no vomiting or diarrhea denies abdominal pain or chest pains denies shortness of breath denies swelling in the legs. PMH she has had some problems with anemia and elevated sedimentation rate members thought that she might have some autoimmune illness going on. And she is seeing hematology before but there really wasn't much that they did for her.  Review of Systems See above.    Objective:   Physical Exam  Lungs are clear hearts regular pulse normal abdomen soft extremities no edema      Assessment & Plan:  Significant fatigue and tiredness we'll do extensive lab work and especially with her history of anemia and her advanced age followup again in 4 weeks. May need to try a trial medication.

## 2012-11-26 ENCOUNTER — Telehealth: Payer: Self-pay | Admitting: Family Medicine

## 2012-11-26 LAB — LIPID PANEL
Cholesterol: 191 mg/dL (ref 0–200)
HDL: 57 mg/dL (ref >39)
LDL Cholesterol: 103 mg/dL — ABNORMAL HIGH (ref 0–99)
Total CHOL/HDL Ratio: 3.4 Ratio
Triglycerides: 156 mg/dL — ABNORMAL HIGH (ref <150)

## 2012-11-26 LAB — HEPATIC FUNCTION PANEL
ALT: 9 U/L (ref 0–35)
AST: 17 U/L (ref 0–37)
Albumin: 4 g/dL (ref 3.5–5.2)
Alkaline Phosphatase: 44 U/L (ref 39–117)
Bilirubin, Direct: 0.1 mg/dL (ref 0.0–0.3)
Indirect Bilirubin: 0.3 mg/dL (ref 0.0–0.9)
Total Bilirubin: 0.4 mg/dL (ref 0.3–1.2)
Total Protein: 7.1 g/dL (ref 6.0–8.3)

## 2012-11-26 LAB — CBC WITH DIFFERENTIAL/PLATELET
Basophils Relative: 1 % (ref 0–1)
Lymphocytes Relative: 35 % (ref 12–46)
Lymphs Abs: 2.6 10*3/uL (ref 0.7–4.0)
MCV: 89.8 fL (ref 78.0–100.0)
Neutrophils Relative %: 49 % (ref 43–77)
Platelets: 296 10*3/uL (ref 150–400)
RBC: 3.44 MIL/uL — ABNORMAL LOW (ref 3.87–5.11)
WBC: 7.6 10*3/uL (ref 4.0–10.5)

## 2012-11-26 LAB — BASIC METABOLIC PANEL
BUN: 24 mg/dL — ABNORMAL HIGH (ref 6–23)
CO2: 21 mEq/L (ref 19–32)
Calcium: 9.3 mg/dL (ref 8.4–10.5)
Chloride: 108 mEq/L (ref 96–112)
Creat: 1.19 mg/dL — ABNORMAL HIGH (ref 0.50–1.10)
Glucose, Bld: 104 mg/dL — ABNORMAL HIGH (ref 70–99)
Potassium: 4.5 mEq/L (ref 3.5–5.3)
Sodium: 140 mEq/L (ref 135–145)

## 2012-11-26 LAB — T4, FREE: Free T4: 1.06 ng/dL (ref 0.80–1.80)

## 2012-11-26 NOTE — Telephone Encounter (Signed)
Notified Patsy (emergency contact) do labs as directed, eat healthy. She verbalized understanding.

## 2012-11-26 NOTE — Telephone Encounter (Signed)
Ok, noted, do labs as directed, eat healthy

## 2012-11-26 NOTE — Telephone Encounter (Signed)
Just an FYI, forgot to let doctor know at last appointment that when Kaitlyn Cooper has weak spells her legs go numb and it feels like she is going to fall.

## 2012-11-30 ENCOUNTER — Ambulatory Visit (INDEPENDENT_AMBULATORY_CARE_PROVIDER_SITE_OTHER): Payer: Medicare Other

## 2012-11-30 DIAGNOSIS — D649 Anemia, unspecified: Secondary | ICD-10-CM

## 2012-11-30 MED ORDER — CYANOCOBALAMIN 1000 MCG/ML IJ SOLN
1000.0000 ug | Freq: Once | INTRAMUSCULAR | Status: AC
  Administered 2012-11-30: 1000 ug via INTRAMUSCULAR

## 2012-12-10 ENCOUNTER — Telehealth: Payer: Self-pay | Admitting: Family Medicine

## 2012-12-10 NOTE — Telephone Encounter (Signed)
This age otc immod capsule no more than two per d. Avoid milk products chees ice crm etc. If worsens this weekend to ERs

## 2012-12-10 NOTE — Telephone Encounter (Signed)
Notified patient this age otc immod capsule no more than two per d. Avoid milk products chees ice crm etc. If worsens this weekend to ERs. Patient verbalized understanding.

## 2012-12-10 NOTE — Telephone Encounter (Signed)
Patient says that she has had diarrhea the past two days and is feeling weak. Please advise.   [place]

## 2012-12-14 ENCOUNTER — Encounter: Payer: Self-pay | Admitting: Cardiovascular Disease

## 2012-12-14 ENCOUNTER — Ambulatory Visit (INDEPENDENT_AMBULATORY_CARE_PROVIDER_SITE_OTHER): Payer: Medicare Other | Admitting: Cardiovascular Disease

## 2012-12-14 VITALS — BP 116/48 | HR 61 | Ht 63.0 in | Wt 107.5 lb

## 2012-12-14 DIAGNOSIS — I2510 Atherosclerotic heart disease of native coronary artery without angina pectoris: Secondary | ICD-10-CM

## 2012-12-14 DIAGNOSIS — R197 Diarrhea, unspecified: Secondary | ICD-10-CM

## 2012-12-14 DIAGNOSIS — D649 Anemia, unspecified: Secondary | ICD-10-CM

## 2012-12-14 DIAGNOSIS — Z950 Presence of cardiac pacemaker: Secondary | ICD-10-CM

## 2012-12-14 DIAGNOSIS — I498 Other specified cardiac arrhythmias: Secondary | ICD-10-CM

## 2012-12-14 DIAGNOSIS — I10 Essential (primary) hypertension: Secondary | ICD-10-CM

## 2012-12-14 DIAGNOSIS — K529 Noninfective gastroenteritis and colitis, unspecified: Secondary | ICD-10-CM

## 2012-12-14 DIAGNOSIS — D510 Vitamin B12 deficiency anemia due to intrinsic factor deficiency: Secondary | ICD-10-CM

## 2012-12-14 DIAGNOSIS — R002 Palpitations: Secondary | ICD-10-CM

## 2012-12-14 NOTE — Progress Notes (Signed)
Patient ID: Kaitlyn Cooper, female   DOB: [birth year], 79 y.o.   MRN: [id number]      SUBJECTIVE: Kaitlyn Cooper is a pleasant 79 yo woman with a h/o symptomatic bradycardia due to sinus node dysfunction and has a pacemaker, hyperlipidemia, HTN, and chronic diarrhea. She is here with her sister.  She denies chest pain, but thinks she's been having palpitations more frequently. She has up to 8 bouts of diarrhea daily, and does not drink much fluids. Her appetite is poor. She is scheduled to see GI this week. Her pacemaker is due to be checked next month, 12/24/12.  She denies syncope.     Allergies  Allergen Reactions  . Penicillins Rash    Current Outpatient Prescriptions  Medication Sig Dispense Refill  . acetaminophen (TYLENOL) 500 MG tablet Take 500 mg by mouth every 6 (six) hours as needed. Pain      . Cholecalciferol (VITAMIN D) 400 UNITS capsule Take 400 Units by mouth daily.        . cyanocobalamin (,VITAMIN B-12,) 1000 MCG/ML injection Inject 1,000 mcg into the muscle every 30 (thirty) days.      Marland Kitchen lovastatin (MEVACOR) 40 MG tablet TAKE ONE TABLET BY MOUTH IN THE EVENING FOR CHOLESTEROL.  30 tablet  5  . ranitidine (ZANTAC) 150 MG tablet TAKE 1 TABLET BY MOUTH TWICE DAILY FOR ACID REFLUX.  60 tablet  5  . sertraline (ZOLOFT) 100 MG tablet TAKE ONE TABLET BY MOUTH ONCE DAILY.  30 tablet  5  . SYNTHROID 88 MCG tablet TAKE 1 TABLET BY MOUTH ONCE DAILY FOR THYROID.  30 tablet  5   No current facility-administered medications for this visit.    Past Medical History  Diagnosis Date  . Anemia     followed by Dr.Neijstrom  . Hyperlipidemia   . Depression   . Hypothyroidism   . History of atherosclerotic cardiovascular disease   . History of colonoscopy     02/2002 showed few diverticula at sigmoid colon otherwise normal colonoscopy and terminal ileoscopy 1995 several colon polyps ,tubular adenoma  . GERD (gastroesophageal reflux disease)     chronic  . GERD (gastroesophageal  reflux disease)     chronic gerd  . Tiredness     Past Surgical History  Procedure Laterality Date  . Insert / replace / remove pacemaker  01/17/2009    St.Jude Cardiac Pacemaker   . Cardiac catheterization  1998  . Cholecystectomy    . 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 Esophagogastroduodenoscopy (egd) with esophageal dilation  03/22/2012    Procedure: ESOPHAGOGASTRODUODENOSCOPY (EGD) WITH ESOPHAGEAL DILATION;  Surgeon: Malissa Hippo, MD;  Location: [facility];  Service: Endoscopy;  Laterality: N/A;  730    History   Social History  . Marital Status: Widowed    Spouse Name: N/A    Number of Children: N/A  . Years of Education: N/A   Occupational History  . Not on file.   Social History Main Topics  . Smoking status: Former Smoker -- 1.00 packs/day for 40 years    Quit date: 08/25/1971  . Smokeless tobacco: Never Used  . Alcohol Use: No  . Drug Use: No  . Sexual Activity: Not on file   Other Topics Concern  . Not on file   Social History Narrative  . No narrative on file     Filed Vitals:   12/14/12 1423  BP: 116/48  Pulse: 61  Height: 5\' 3"  (1.6 m)  Weight: 107 lb 8 oz (48.762 kg)    PHYSICAL EXAM General: NAD Neck: No JVD, no thyromegaly or thyroid nodule.  Lungs: Clear to auscultation bilaterally with normal respiratory effort. CV: Nondisplaced PMI.  Heart regular S1/S2, no S3/S4, no murmur.  No peripheral edema.  No carotid bruit.  Normal pedal pulses.  Abdomen: Soft, nontender, no hepatosplenomegaly, no distention.  Neurologic: Alert and oriented x 3.  Psych: Normal affect. Extremities: No clubbing or cyanosis.   ECG: reviewed and available in electronic records.      ASSESSMENT AND PLAN: 1. Palpitations: possibly due to electrolyte depletion given significant diarrhea. Will check potassium and magnesium levels. Will f/u pacemaker interrogation to see if she's been experiencing any tachyarrhythmias.  2.  HTN: controlled. 3. Hyperlipidemia: on lovastatin. 4. Chronic diarrhea: I wonder if she has an inflammatory bowel disorder. She will likely require colonoscopy with biopsies, but will defer the w/u to GI. 5. S/p PPM: to be checked on 11/7.   Prentice Docker, M.D., F.A.C.C.

## 2012-12-14 NOTE — Patient Instructions (Addendum)
Your physician recommends that you schedule a follow-up appointment in: ONE YEAR   Your physician recommends that you return for lab work in: TODAY (SLIPS GIVEN FOR POTASSIUM/MAGNESIUM)   WE WILL CALL YOU WITH YOUR TEST RESULTS/INSTRUCTIONS/NEXT STEPS ONCE RECEIVED BY THE PROVIDER

## 2012-12-15 ENCOUNTER — Encounter: Payer: Self-pay | Admitting: *Deleted

## 2012-12-15 LAB — POTASSIUM: Potassium: 4.7 mEq/L (ref 3.5–5.3)

## 2012-12-16 ENCOUNTER — Telehealth (INDEPENDENT_AMBULATORY_CARE_PROVIDER_SITE_OTHER): Payer: Self-pay | Admitting: *Deleted

## 2012-12-16 ENCOUNTER — Encounter (INDEPENDENT_AMBULATORY_CARE_PROVIDER_SITE_OTHER): Payer: Self-pay | Admitting: Internal Medicine

## 2012-12-16 ENCOUNTER — Ambulatory Visit (INDEPENDENT_AMBULATORY_CARE_PROVIDER_SITE_OTHER): Payer: Medicare Other | Admitting: Internal Medicine

## 2012-12-16 ENCOUNTER — Other Ambulatory Visit (INDEPENDENT_AMBULATORY_CARE_PROVIDER_SITE_OTHER): Payer: Self-pay | Admitting: *Deleted

## 2012-12-16 VITALS — BP 110/60 | HR 80 | Temp 98.0°F | Ht 63.0 in | Wt 106.5 lb

## 2012-12-16 DIAGNOSIS — Z1211 Encounter for screening for malignant neoplasm of colon: Secondary | ICD-10-CM

## 2012-12-16 DIAGNOSIS — R197 Diarrhea, unspecified: Secondary | ICD-10-CM

## 2012-12-16 DIAGNOSIS — K529 Noninfective gastroenteritis and colitis, unspecified: Secondary | ICD-10-CM

## 2012-12-16 DIAGNOSIS — Z800 Family history of malignant neoplasm of digestive organs: Secondary | ICD-10-CM

## 2012-12-16 NOTE — Progress Notes (Signed)
Subjective:     Patient ID: Kaitlyn Cooper, female   DOB: 18-Jan-1927, 79 y.o.   MRN: [id number]  HPIPresents today with c/o loose stools. Her stools have been loose for months/years. She is having usually one stool a day. Last Friday she went 8 times. Her stools are not always loose. Some days her stools are normal.  She says her symptoms have gotten worse. No melena or bright red rectal bleeding. She denies any abdominal pain. Appetite is not good. She has lost about 1 1/2 pounds since her lat visit in January.  She has not tried anything to firm her stools.  Family hx of colon cancer (mother) No recent hx of taking antibiotics    EGD/ED  03/22/2012 Dysphagia Dr. Karilyn Cota; Impression:  Two small diverticula noted at esophageal body.  High grade Schatzki's ring disrupted with balloon dilation to 19 mm.  Moderate size sliding hiatal hernia.  Small AV malformation at gastric fundus without stigmata of bleed.        Review of Systems see hpi Current Outpatient Prescriptions  Medication Sig Dispense Refill  . acetaminophen (TYLENOL) 500 MG tablet Take 500 mg by mouth every 6 (six) hours as needed. Pain      . Cholecalciferol (VITAMIN D) 400 UNITS capsule Take 400 Units by mouth daily.        . cyanocobalamin (,VITAMIN B-12,) 1000 MCG/ML injection Inject 1,000 mcg into the muscle every 30 (thirty) days.      Marland Kitchen lovastatin (MEVACOR) 40 MG tablet TAKE ONE TABLET BY MOUTH IN THE EVENING FOR CHOLESTEROL.  30 tablet  5  . ranitidine (ZANTAC) 150 MG tablet TAKE 1 TABLET BY MOUTH TWICE DAILY FOR ACID REFLUX.  60 tablet  5  . sertraline (ZOLOFT) 100 MG tablet TAKE ONE TABLET BY MOUTH ONCE DAILY.  30 tablet  5  . SYNTHROID 88 MCG tablet TAKE 1 TABLET BY MOUTH ONCE DAILY FOR THYROID.  30 tablet  5   No current facility-administered medications for this visit.   Past Medical History  Diagnosis Date  . Anemia     followed by Dr.Neijstrom  . Hyperlipidemia   . Depression   . Hypothyroidism   .  History of atherosclerotic cardiovascular disease   . History of colonoscopy     02/2002 showed few diverticula at sigmoid colon otherwise normal colonoscopy and terminal ileoscopy 1995 several colon polyps ,tubular adenoma  . GERD (gastroesophageal reflux disease)     chronic  . GERD (gastroesophageal reflux disease)     chronic gerd  . Tiredness    Past Surgical History  Procedure Laterality Date  . Insert / replace / remove pacemaker  01/17/2009    [employer].Jude Cardiac Pacemaker   . Cardiac catheterization  1998  . Cholecystectomy    . 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 Esophagogastroduodenoscopy (egd) with esophageal dilation  03/22/2012    Procedure: ESOPHAGOGASTRODUODENOSCOPY (EGD) WITH ESOPHAGEAL DILATION;  Surgeon: Malissa Hippo, MD;  Location: [facility];  Service: Endoscopy;  Laterality: N/A;  730   Allergies  Allergen Reactions  . Penicillins Rash        Objective:   Physical Exam  Filed Vitals:   12/16/12 1101  BP: 110/60  Pulse: 80  Temp: 98 F (36.7 C)  Height: 5\' 3"  (1.6 m)  Weight: 106 lb 8 oz (48.308 kg)  Alert and oriented. Skin warm and dry. Oral mucosa is moist.   . Sclera anicteric,  conjunctivae is pink. Thyroid not enlarged. No cervical lymphadenopathy. Lungs clear. Heart regular rate and rhythm.  Abdomen is soft. Bowel sounds are positive. No hepatomegaly. No abdominal masses felt. No tenderness.  No edema to lower extremities.        Assessment:    Diarrhea which apparently has been chronic.    Family hx of colon cancer (mother) Plan:    Fiber 4gm daily. Imodium once a day/ Will schedule a colonoscopy with Dr.Rehman

## 2012-12-16 NOTE — Patient Instructions (Signed)
Fiber 4gm daily. Imodium once a day

## 2012-12-16 NOTE — Telephone Encounter (Signed)
Patient needs movi prep 

## 2012-12-17 ENCOUNTER — Encounter (HOSPITAL_COMMUNITY): Payer: Self-pay | Admitting: Pharmacy Technician

## 2012-12-22 MED ORDER — PEG-KCL-NACL-NASULF-NA ASC-C 100 G PO SOLR: 1.0000 | Freq: Once | ORAL | Status: DC

## 2012-12-24 ENCOUNTER — Ambulatory Visit (INDEPENDENT_AMBULATORY_CARE_PROVIDER_SITE_OTHER): Payer: Medicare Other | Admitting: *Deleted

## 2012-12-24 DIAGNOSIS — I498 Other specified cardiac arrhythmias: Secondary | ICD-10-CM

## 2012-12-24 NOTE — Progress Notes (Signed)
PPM check 

## 2012-12-29 ENCOUNTER — Encounter (HOSPITAL_COMMUNITY): Payer: Self-pay

## 2012-12-29 ENCOUNTER — Ambulatory Visit (HOSPITAL_COMMUNITY)
Admission: RE | Admit: 2012-12-29 | Discharge: 2012-12-29 | Disposition: A | Discharge status: Home / Self Care | Payer: Medicare Other | Source: Ambulatory Visit | Attending: Internal Medicine | Admitting: Internal Medicine

## 2012-12-29 ENCOUNTER — Encounter (HOSPITAL_COMMUNITY)
Admission: RE | Disposition: A | Discharge status: Home / Self Care | Payer: Self-pay | Source: Ambulatory Visit | Attending: Internal Medicine

## 2012-12-29 DIAGNOSIS — Z860100 Personal history of colon polyps, unspecified: Secondary | ICD-10-CM | POA: Insufficient documentation

## 2012-12-29 DIAGNOSIS — K5730 Diverticulosis of large intestine without perforation or abscess without bleeding: Secondary | ICD-10-CM | POA: Insufficient documentation

## 2012-12-29 DIAGNOSIS — D126 Benign neoplasm of colon, unspecified: Secondary | ICD-10-CM | POA: Insufficient documentation

## 2012-12-29 DIAGNOSIS — R197 Diarrhea, unspecified: Secondary | ICD-10-CM | POA: Insufficient documentation

## 2012-12-29 DIAGNOSIS — K644 Residual hemorrhoidal skin tags: Secondary | ICD-10-CM | POA: Insufficient documentation

## 2012-12-29 HISTORY — PX: COLONOSCOPY: SHX5424

## 2012-12-29 LAB — MDC_IDC_ENUM_SESS_TYPE_INCLINIC
Battery Remaining Longevity: 88.8 mo
Battery Voltage: 2.93 V
Brady Statistic RA Percent Paced: 85 %
Date Time Interrogation Session: 20141107192811
Implantable Pulse Generator Serial Number: 7094651
Lead Channel Impedance Value: 387.5 Ohm
Lead Channel Impedance Value: 675 Ohm
Lead Channel Pacing Threshold Pulse Width: 0.4 ms
Lead Channel Pacing Threshold Pulse Width: 0.4 ms
Lead Channel Sensing Intrinsic Amplitude: 4.7 mV
Lead Channel Setting Pacing Amplitude: 1 V
Lead Channel Setting Pacing Pulse Width: 0.4 ms
Lead Channel Setting Sensing Sensitivity: 2 mV

## 2012-12-29 SURGERY — COLONOSCOPY
Anesthesia: Moderate Sedation

## 2012-12-29 MED ORDER — MIDAZOLAM HCL 5 MG/5ML IJ SOLN
INTRAMUSCULAR | Status: AC
  Filled 2012-12-29: qty 10

## 2012-12-29 MED ORDER — MEPERIDINE HCL 50 MG/ML IJ SOLN
INTRAMUSCULAR | Status: AC
  Filled 2012-12-29: qty 1

## 2012-12-29 MED ORDER — SIMETHICONE 40 MG/0.6ML PO SUSP
ORAL | Status: DC | PRN
  Administered 2012-12-29: 14:00:00

## 2012-12-29 MED ORDER — SODIUM CHLORIDE 0.9 % IV SOLN
INTRAVENOUS | Status: DC
  Administered 2012-12-29: 13:00:00 via INTRAVENOUS

## 2012-12-29 MED ORDER — MEPERIDINE HCL 50 MG/ML IJ SOLN
INTRAMUSCULAR | Status: DC | PRN
  Administered 2012-12-29 (×2): 25 mg via INTRAVENOUS

## 2012-12-29 MED ORDER — MIDAZOLAM HCL 5 MG/5ML IJ SOLN
INTRAMUSCULAR | Status: DC | PRN
  Administered 2012-12-29: 2 mg via INTRAVENOUS
  Administered 2012-12-29: 1 mg via INTRAVENOUS

## 2012-12-29 MED ORDER — PSYLLIUM 28 % PO PACK: 1.0000 | PACK | Freq: Every day | ORAL | Status: DC

## 2012-12-29 MED ORDER — LOPERAMIDE HCL 2 MG PO TABS: 2.0000 mg | ORAL_TABLET | Freq: Every day | ORAL | Status: DC

## 2012-12-29 NOTE — Op Note (Signed)
COLONOSCOPY PROCEDURE REPORT  PATIENT:  Kaitlyn Cooper  MR#:  [id number] Birthdate:  1926-05-11, 79 y.o., female Endoscopist:  Dr. Malissa Hippo, MD Referred By:  Dr. [name], MD Procedure Date: 12/29/2012  Procedure:   Colonoscopy  Indications:  Patient is an 78 year-old Caucasian female who presents with chronic diarrhea and she has history of colonic polyps in her mother had polyp removal at high-grade dysplasia or invasive carcinoma at age 61.  Informed Consent:  The procedure and risks were reviewed with the patient and informed consent was obtained.  Medications:  Demerol 50 mg IV Versed 3 mg IV  Description of procedure:  After a digital rectal exam was performed, that colonoscope was advanced from the anus through the rectum and colon to the area of the cecum, ileocecal valve and appendiceal orifice. The cecum was deeply intubated. These structures were well-seen and photographed for the record. From the level of the cecum and ileocecal valve, the scope was slowly and cautiously withdrawn. The mucosal surfaces were carefully surveyed utilizing scope tip to flexion to facilitate fold flattening as needed. The scope was pulled down into the rectum where a thorough exam including retroflexion was performed. Terminal ileum was also examined.  Findings:   Prep satisfactory. Normal mucosa of terminal ileum. Small cecal polyp ablated via cold biopsy. Colonic mucosa appeared normal without erosions or other abnormalities. Random biopsies taken from [facility] of sigmoid colon. Scattered diverticula at  sigmoid colon. Small hemorrhoids below the dentate line.   Therapeutic/Diagnostic Maneuvers Performed:  See above  Complications:  None  Cecal Withdrawal Time:  11 minutes  Impression:  Normal mucosa of terminal ileum. Small cecal polyp ablated via cold biopsy. Mild Sigmoid colon diverticulosis. No evidence of endoscopic colitis. Random biopsies taken from mucosa of sigmoid  colon looking for collagenous or microscopic colitis. Small external hemorrhoids.  Recommendations:  Standard instructions given. Imodium 2 mg by mouth every morning. Metamucil 4 g by mouth daily. I will contact patient with biopsy results and further recommendations.  Corleen Otwell U  12/29/2012 2:08 PM  CC: Dr. [name], MD & Dr. Bonnetta Barry ref. provider found

## 2012-12-29 NOTE — H&P (Signed)
Kaitlyn Cooper is an 79 y.o. female.   Chief Complaint: Patient is here for colonoscopy. HPI: Patient is a 39-year-old Caucasian female who presents with worsening diarrhea. She has anywhere from 1-8 stools per day. Her stools are loose and watery. She denies abdominal pain rectal bleeding or melena. Her appetite is fair. She has lost 7 pounds over a period of several months. She denies nocturnal diarrhea. Patient was at this facility in February this year for EGD/ED. She states she has been able to swallow much better since esophageal dilation. Patient has history of colonic adenomas. She had 2 small polyps removed in 1995 exam was negative in 2004. Her mother had a polyp removed with high-grade dysplasia or carcinoma. She was around 80 at the time. She she lived to be 91.  Past Medical History  Diagnosis Date  . Anemia     followed by Dr.Neijstrom  . Hyperlipidemia   . Depression   . Hypothyroidism   . History of atherosclerotic cardiovascular disease   . History of colonoscopy     02/2002 showed few diverticula at sigmoid colon otherwise normal colonoscopy and terminal ileoscopy 1995 several colon polyps ,tubular adenoma  . GERD (gastroesophageal reflux disease)     chronic  . GERD (gastroesophageal reflux disease)     chronic gerd  . Tiredness     Past Surgical History  Procedure Laterality Date  . Insert / replace / remove pacemaker  01/17/2009    St.Jude Cardiac Pacemaker   . Cardiac catheterization  1998  . Cholecystectomy    . 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 Esophagogastroduodenoscopy (egd) with esophageal dilation  03/22/2012    Procedure: ESOPHAGOGASTRODUODENOSCOPY (EGD) WITH ESOPHAGEAL DILATION;  Surgeon: Malissa Hippo, MD;  Location: [facility];  Service: Endoscopy;  Laterality: N/A;  730    History reviewed. No pertinent family history. Social History:  reports that she quit smoking about 41 years ago. She has never used  smokeless tobacco. She reports that she does not drink alcohol or use illicit drugs.  Allergies:  Allergies  Allergen Reactions  . Penicillins Rash    Medications Prior to Admission  Medication Sig Dispense Refill  . acetaminophen (TYLENOL) 500 MG tablet Take 500 mg by mouth every 6 (six) hours as needed. Pain      . Cholecalciferol (VITAMIN D) 400 UNITS capsule Take 400 Units by mouth daily.        . cyanocobalamin (,VITAMIN B-12,) 1000 MCG/ML injection Inject 1,000 mcg into the muscle every 30 (thirty) days.      Marland Kitchen lovastatin (MEVACOR) 40 MG tablet TAKE ONE TABLET BY MOUTH IN THE EVENING FOR CHOLESTEROL.  30 tablet  5  . peg 3350 powder (MOVIPREP) 100 G SOLR Take 1 kit (200 g total) by mouth once.  1 kit  0  . ranitidine (ZANTAC) 150 MG tablet TAKE 1 TABLET BY MOUTH TWICE DAILY FOR ACID REFLUX.  60 tablet  5  . sertraline (ZOLOFT) 100 MG tablet TAKE ONE TABLET BY MOUTH ONCE DAILY.  30 tablet  5  . SYNTHROID 88 MCG tablet TAKE 1 TABLET BY MOUTH ONCE DAILY FOR THYROID.  30 tablet  5    No results found for this or any previous visit (from the past 48 hour(s)). No results found.  ROS  Blood pressure 149/62, pulse 72, temperature 97.4 F (36.3 C), temperature source Oral, resp. rate 22, height 5' 3.5" (1.613 m), weight 107 lb (  48.535 kg), SpO2 98.00%. Physical Exam  Constitutional:  Well-developed thin Caucasian female in NAD.  HENT:  Mouth/Throat: Oropharynx is clear and moist.  Eyes: Conjunctivae are normal. No scleral icterus.  Neck: No thyromegaly present.  Cardiovascular: Normal rate, regular rhythm and normal heart sounds.   No murmur heard. Respiratory: Effort normal and breath sounds normal.  GI: Soft. She exhibits no distension and no mass. There is no tenderness.  Musculoskeletal: She exhibits no edema.  Lymphadenopathy:    She has no cervical adenopathy.  Neurological: She is alert.  Skin: Skin is warm and dry.     Assessment/Plan Chronic diarrhea. History of  colonic adenomas. Diagnostic colonoscopy.  Paton Crum U 12/29/2012, 1:31 PM

## 2012-12-31 ENCOUNTER — Encounter: Payer: Self-pay | Admitting: Internal Medicine

## 2012-12-31 ENCOUNTER — Encounter: Payer: Self-pay | Admitting: Family Medicine

## 2012-12-31 ENCOUNTER — Ambulatory Visit (INDEPENDENT_AMBULATORY_CARE_PROVIDER_SITE_OTHER): Payer: Medicare Other | Admitting: Family Medicine

## 2012-12-31 VITALS — BP 104/60 | Ht 63.0 in | Wt 107.4 lb

## 2012-12-31 DIAGNOSIS — D649 Anemia, unspecified: Secondary | ICD-10-CM

## 2012-12-31 DIAGNOSIS — R5381 Other malaise: Secondary | ICD-10-CM

## 2012-12-31 MED ORDER — CYANOCOBALAMIN 1000 MCG/ML IJ SOLN
1000.0000 ug | Freq: Once | INTRAMUSCULAR | Status: AC
  Administered 2012-12-31: 1000 ug via INTRAMUSCULAR

## 2012-12-31 NOTE — Progress Notes (Signed)
  Subjective:    Patient ID: Kaitlyn Cooper, female    DOB: 1926/11/11, 79 y.o.   MRN: [id number]  HPI Patient is here today for a follow up visit on her fatigue. Patient states that her symptoms are about the same. Still experiencing tiredness a lot.  Patient's fatigue cancer after 10-15 minutes of activity. Then gets little bit better then hit her again she denies chest tightness pressure pain denies shortness of breath recent lab work reviewed with the patient. Appetite doing well.  Review of Systems See above. Denies chest tightness pressure pain shortness of breath denies vomiting diarrhea.    Objective:   Physical Exam   Lungs are clear hearts regular pulse normal abdomen soft extremities no edema     Assessment & Plan:  Fatigue-I. believe this is mainly age related Anemia of chronic disease Continue current measures recheck patient in in 4 months recheck lab work in the spring

## 2013-01-03 ENCOUNTER — Encounter (HOSPITAL_COMMUNITY): Payer: Self-pay | Admitting: Internal Medicine

## 2013-01-12 NOTE — Progress Notes (Signed)
LM to return the call to schedule a f/u apt.  

## 2013-01-19 NOTE — Progress Notes (Signed)
Apt has been scheduled for 04/04/13 for a 3 month procedure f/u with Dr. Karilyn Cota.

## 2013-01-29 ENCOUNTER — Other Ambulatory Visit: Payer: Self-pay | Admitting: Family Medicine

## 2013-01-31 ENCOUNTER — Telehealth (INDEPENDENT_AMBULATORY_CARE_PROVIDER_SITE_OTHER): Payer: Self-pay | Admitting: *Deleted

## 2013-01-31 NOTE — Telephone Encounter (Signed)
Forward to Dr.Rehman 

## 2013-01-31 NOTE — Telephone Encounter (Signed)
I have talked with the patient. She states that she has no energy. Diarrhea is better. Currently taking a vitamin B 12 shot every mouth and she is says that PCP has done nothing different,says it is her age and she was wondering of Dr.Rehman could do something different to help her with her anemia? Patient advised that Dr.Rehman would be made aware and then we would call her back.

## 2013-01-31 NOTE — Telephone Encounter (Signed)
Kaitlyn Cooper is very weak and can hardly do anything. Would like to know if Dr. Karilyn Cota can do anything for her. The return phone number is [phone]. Called Taniyah and could not get anymore information from her other than very weak.

## 2013-02-01 ENCOUNTER — Encounter: Payer: Self-pay | Admitting: Family Medicine

## 2013-02-01 ENCOUNTER — Ambulatory Visit (INDEPENDENT_AMBULATORY_CARE_PROVIDER_SITE_OTHER): Payer: Medicare Other | Admitting: Family Medicine

## 2013-02-01 VITALS — BP 112/50 | Ht 63.0 in | Wt 104.4 lb

## 2013-02-01 DIAGNOSIS — D649 Anemia, unspecified: Secondary | ICD-10-CM | POA: Diagnosis not present

## 2013-02-01 DIAGNOSIS — R531 Weakness: Secondary | ICD-10-CM

## 2013-02-01 DIAGNOSIS — R5381 Other malaise: Secondary | ICD-10-CM

## 2013-02-01 MED ORDER — CYANOCOBALAMIN 1000 MCG/ML IJ SOLN
1000.0000 ug | Freq: Once | INTRAMUSCULAR | Status: AC
  Administered 2013-02-01: 1000 ug via INTRAMUSCULAR

## 2013-02-01 NOTE — Progress Notes (Signed)
   Subjective:    Patient ID: Kaitlyn Cooper, female    DOB: November 15, 1926, 79 y.o.   MRN: [id number]  HPI Patient was here today for a nurse visit to get her B12 shot. She then stated she felt weak. She said she has felt weak for months now.     Review of Systems     Objective:   Physical Exam        Assessment & Plan:

## 2013-02-01 NOTE — Telephone Encounter (Signed)
Patient needs office visit. No over the phone recommendations at this time

## 2013-02-01 NOTE — Addendum Note (Signed)
Addended by: Dereck Ligas on: 02/01/2013 10:39 AM   Modules accepted: Orders

## 2013-02-01 NOTE — Telephone Encounter (Signed)
I will make Dr.Rehman aware. 

## 2013-02-01 NOTE — Telephone Encounter (Signed)
Forwarded to Dr.Rehman as Kaitlyn Cooper. Dr.Rehman per Lupita Leash there is no openings with you, patient has an appointment 04/04/13 with you. May we arrange appointment with [name] while you are in the office?

## 2013-02-01 NOTE — Addendum Note (Signed)
Addended byOneal Deputy D on: 02/01/2013 11:03 AM   Modules accepted: Orders

## 2013-02-01 NOTE — Telephone Encounter (Signed)
FYIShaneece Cooper has an apt scheduled for 04/04/12 with Dr. Karilyn Cota. No available apt's any earlier.

## 2013-02-02 NOTE — Telephone Encounter (Signed)
Called and there was no answer. Will try back tomorrow.

## 2013-02-02 NOTE — Telephone Encounter (Signed)
Forwarded to Lupita Leash to make the patient an appointment with [name] per Dr.Rehman's agreement. Refer to notes. Please contact patient with the appointment.

## 2013-02-02 NOTE — Telephone Encounter (Signed)
Apt is scheduled for 02/03/13 with Dorene Ar, NP.

## 2013-02-02 NOTE — Telephone Encounter (Signed)
Agree with plans for office visit Ms. Kaitlyn Fuller, NP

## 2013-02-03 ENCOUNTER — Encounter (INDEPENDENT_AMBULATORY_CARE_PROVIDER_SITE_OTHER): Payer: Self-pay | Admitting: Internal Medicine

## 2013-02-03 ENCOUNTER — Ambulatory Visit (INDEPENDENT_AMBULATORY_CARE_PROVIDER_SITE_OTHER): Payer: Medicare Other | Admitting: Internal Medicine

## 2013-02-03 VITALS — BP 94/61 | HR 64 | Temp 97.0°F | Ht 63.5 in | Wt 103.1 lb

## 2013-02-03 DIAGNOSIS — E039 Hypothyroidism, unspecified: Secondary | ICD-10-CM

## 2013-02-03 DIAGNOSIS — R5381 Other malaise: Secondary | ICD-10-CM

## 2013-02-03 DIAGNOSIS — D649 Anemia, unspecified: Secondary | ICD-10-CM

## 2013-02-03 DIAGNOSIS — R531 Weakness: Secondary | ICD-10-CM

## 2013-02-03 LAB — IRON AND TIBC
Iron: 102 ug/dL (ref 42–145)
UIBC: 304 ug/dL (ref 125–400)

## 2013-02-03 NOTE — Progress Notes (Signed)
Subjective:     Patient ID: Kaitlyn Cooper, female   DOB: [birth year], 79 y.o.   MRN: [id number]  HPI  Presents today with c/o having weak spells. While in Dr. Fletcher Anon office she became weak and had to sit down. Her weakness apparently is in her legs. Her cholesterol medication is presently on hold.   Dr. Gerda Diss saw patient  This week and he ordered Tissue transglutaminase IgA, CK, Sedrate, TSH She tells me she is weak all the time. At times, she is weaker than others. She says sometimes her legs feel like they have gone to sleep or are numb. Hx of Vitamin B12 deficiency.  Last B12 level in January of this year was 1511, Her appetite is okay. She has lost about 7 to 8 pounds. Her BMs are better since starting the Fiber. 12/2012 Colonoscopy diarrhea:  Normal mucosa of terminal ileum.  Small cecal polyp ablated via cold biopsy.  Mild Sigmoid colon diverticulosis.  No evidence of endoscopic colitis. Random biopsies taken from mucosa of sigmoid colon looking for collagenous or microscopic colitis.  Small external hemorrhoids.    CBC    Component Value Date/Time   WBC 7.6 11/25/2012 0822   RBC 3.44* 11/25/2012 0822   RBC 3.49* 01/17/2011 1246   HGB 11.0* 02/01/2013 1039   HGB 10.1* 11/25/2012 0822   HCT 30.9* 11/25/2012 0822   PLT 296 11/25/2012 0822   MCV 89.8 11/25/2012 0822   MCH 29.4 11/25/2012 0822   MCHC 32.7 11/25/2012 0822   RDW 16.2* 11/25/2012 0822   LYMPHSABS 2.6 11/25/2012 0822   MONOABS 0.9 11/25/2012 0822   EOSABS 0.3 11/25/2012 0822   BASOSABS 0.0 11/25/2012 4098       Review of Systems Current Outpatient Prescriptions  Medication Sig Dispense Refill  . acetaminophen (TYLENOL) 500 MG tablet Take 500 mg by mouth every 6 (six) hours as needed. Pain      . Cholecalciferol (VITAMIN D) 400 UNITS capsule Take 400 Units by mouth daily.        . cyanocobalamin (,VITAMIN B-12,) 1000 MCG/ML injection Inject 1,000 mcg into the muscle every 30 (thirty) days.      Marland Kitchen loperamide (IMODIUM  A-D) 2 MG tablet Take 1 tablet (2 mg total) by mouth daily.  1 tablet  0  . psyllium (METAMUCIL SMOOTH TEXTURE) 28 % packet Take 1 packet by mouth at bedtime.      . ranitidine (ZANTAC) 150 MG tablet TAKE 1 TABLET BY MOUTH TWICE DAILY FOR ACID REFLUX.  60 tablet  2  . sertraline (ZOLOFT) 100 MG tablet TAKE ONE TABLET BY MOUTH ONCE DAILY.  30 tablet  3  . SYNTHROID 88 MCG tablet TAKE 1 TABLET BY MOUTH ONCE DAILY FOR THYROID.  30 tablet  3  . lovastatin (MEVACOR) 40 MG tablet TAKE ONE TABLET BY MOUTH IN THE EVENING FOR CHOLESTEROL.  30 tablet  5   No current facility-administered medications for this visit.   Past Medical History  Diagnosis Date  . Anemia     followed by Dr.Neijstrom  . Hyperlipidemia   . Depression   . Hypothyroidism   . History of atherosclerotic cardiovascular disease   . History of colonoscopy     02/2002 showed few diverticula at sigmoid colon otherwise normal colonoscopy and terminal ileoscopy 1995 several colon polyps ,tubular adenoma  . GERD (gastroesophageal reflux disease)     chronic  . GERD (gastroesophageal reflux disease)     chronic gerd  . Tiredness  Past Surgical History  Procedure Laterality Date  . Insert / replace / remove pacemaker  01/17/2009    St.Jude Cardiac Pacemaker   . Cardiac catheterization  1998  . Cholecystectomy    . 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 Esophagogastroduodenoscopy (egd) with esophageal dilation  03/22/2012    Procedure: ESOPHAGOGASTRODUODENOSCOPY (EGD) WITH ESOPHAGEAL DILATION;  Surgeon: Malissa Hippo, MD;  Location: [facility];  Service: Endoscopy;  Laterality: N/A;  730  . Colonoscopy N/A 12/29/2012    Procedure: COLONOSCOPY;  Surgeon: Malissa Hippo, MD;  Location: [facility];  Service: Endoscopy;  Laterality: N/A;  130   Allergies  Allergen Reactions  . Penicillins Rash       Objective:   Physical Exam  Filed Vitals:   02/03/13 1532  BP: 94/61  Pulse: 64  Temp:  97 F (36.1 C)  Height: 5' 3.5" (1.613 m)  Weight: 103 lb 1.6 oz (46.766 kg)   Alert and oriented. Skin warm and dry. Oral mucosa is moist.   . Sclera anicteric, conjunctivae is pink. Thyroid not enlarged. No cervical lymphadenopathy. Lungs clear. Heart regular rate and rhythm.  Abdomen is soft. Bowel sounds are positive. No hepatomegaly. No abdominal masses felt. No tenderness.  No edema to lower extremities.       Assessment:    Weakness, ? Etiology. Her cholesterol medication is on hold. She c/o leg weakness. CK,  Tissue transglutaminase IgA, are pending.    Plan:     Ferritin, Iron, Further recommendations to follow. Patient has hx of pacemaker and may need cardiology consult.

## 2013-02-03 NOTE — Patient Instructions (Signed)
Labs today    Further recommendations to follow

## 2013-02-04 LAB — CK TOTAL AND CKMB (NOT AT ARMC)
CK, MB: 2.2 ng/mL (ref 0.0–5.0)
Total CK: 75 U/L (ref 7–177)

## 2013-02-04 LAB — FERRITIN: Ferritin: 33 ng/mL (ref 10–291)

## 2013-02-04 LAB — TSH: TSH: 0.123 u[IU]/mL — ABNORMAL LOW (ref 0.350–4.500)

## 2013-02-04 LAB — TISSUE TRANSGLUTAMINASE, IGA: Tissue Transglutaminase Ab, IgA: 6.7 U/mL (ref <20)

## 2013-02-07 MED ORDER — LEVOTHYROXINE SODIUM 75 MCG PO TABS: 75.0000 ug | ORAL_TABLET | Freq: Every day | ORAL | Status: DC

## 2013-02-07 NOTE — Addendum Note (Signed)
Addended by: Margaretha Sheffield on: 02/07/2013 04:58 PM   Modules accepted: Orders

## 2013-02-09 ENCOUNTER — Encounter (HOSPITAL_COMMUNITY): Payer: Self-pay | Admitting: Emergency Medicine

## 2013-02-09 ENCOUNTER — Emergency Department (HOSPITAL_COMMUNITY): Payer: Medicare Other

## 2013-02-09 ENCOUNTER — Emergency Department (HOSPITAL_COMMUNITY)
Admission: EM | Admit: 2013-02-09 | Discharge: 2013-02-09 | Disposition: A | Discharge status: Home / Self Care | Payer: Medicare Other | Attending: Emergency Medicine | Admitting: Emergency Medicine

## 2013-02-09 ENCOUNTER — Inpatient Hospital Stay (HOSPITAL_COMMUNITY)
Admission: EM | Admit: 2013-02-09 | Discharge: 2013-02-15 | DRG: 682 | Disposition: A | Discharge status: Skilled Nursing Facility | Payer: Medicare Other | Attending: Family Medicine | Admitting: Family Medicine

## 2013-02-09 DIAGNOSIS — E876 Hypokalemia: Secondary | ICD-10-CM | POA: Diagnosis present

## 2013-02-09 DIAGNOSIS — R627 Adult failure to thrive: Secondary | ICD-10-CM | POA: Diagnosis present

## 2013-02-09 DIAGNOSIS — E860 Dehydration: Secondary | ICD-10-CM | POA: Diagnosis present

## 2013-02-09 DIAGNOSIS — K529 Noninfective gastroenteritis and colitis, unspecified: Secondary | ICD-10-CM | POA: Diagnosis present

## 2013-02-09 DIAGNOSIS — D649 Anemia, unspecified: Secondary | ICD-10-CM

## 2013-02-09 DIAGNOSIS — S22009A Unspecified fracture of unspecified thoracic vertebra, initial encounter for closed fracture: Secondary | ICD-10-CM | POA: Diagnosis present

## 2013-02-09 DIAGNOSIS — S22079A Unspecified fracture of T9-T10 vertebra, initial encounter for closed fracture: Secondary | ICD-10-CM

## 2013-02-09 DIAGNOSIS — I2510 Atherosclerotic heart disease of native coronary artery without angina pectoris: Secondary | ICD-10-CM | POA: Diagnosis present

## 2013-02-09 DIAGNOSIS — K5289 Other specified noninfective gastroenteritis and colitis: Secondary | ICD-10-CM | POA: Diagnosis present

## 2013-02-09 DIAGNOSIS — N179 Acute kidney failure, unspecified: Secondary | ICD-10-CM | POA: Diagnosis present

## 2013-02-09 DIAGNOSIS — R531 Weakness: Secondary | ICD-10-CM | POA: Diagnosis present

## 2013-02-09 DIAGNOSIS — F3289 Other specified depressive episodes: Secondary | ICD-10-CM | POA: Diagnosis present

## 2013-02-09 DIAGNOSIS — D509 Iron deficiency anemia, unspecified: Secondary | ICD-10-CM | POA: Diagnosis present

## 2013-02-09 DIAGNOSIS — Z95818 Presence of other cardiac implants and grafts: Secondary | ICD-10-CM | POA: Insufficient documentation

## 2013-02-09 DIAGNOSIS — E039 Hypothyroidism, unspecified: Secondary | ICD-10-CM | POA: Insufficient documentation

## 2013-02-09 DIAGNOSIS — Z8679 Personal history of other diseases of the circulatory system: Secondary | ICD-10-CM | POA: Insufficient documentation

## 2013-02-09 DIAGNOSIS — Z8659 Personal history of other mental and behavioral disorders: Secondary | ICD-10-CM

## 2013-02-09 DIAGNOSIS — W19XXXA Unspecified fall, initial encounter: Secondary | ICD-10-CM | POA: Diagnosis present

## 2013-02-09 DIAGNOSIS — Z85828 Personal history of other malignant neoplasm of skin: Secondary | ICD-10-CM

## 2013-02-09 DIAGNOSIS — Z950 Presence of cardiac pacemaker: Secondary | ICD-10-CM | POA: Insufficient documentation

## 2013-02-09 DIAGNOSIS — R197 Diarrhea, unspecified: Secondary | ICD-10-CM | POA: Diagnosis present

## 2013-02-09 DIAGNOSIS — R1310 Dysphagia, unspecified: Secondary | ICD-10-CM | POA: Diagnosis present

## 2013-02-09 DIAGNOSIS — Z87891 Personal history of nicotine dependence: Secondary | ICD-10-CM

## 2013-02-09 DIAGNOSIS — Y939 Activity, unspecified: Secondary | ICD-10-CM | POA: Insufficient documentation

## 2013-02-09 DIAGNOSIS — Z880 Allergy status to penicillin: Secondary | ICD-10-CM | POA: Insufficient documentation

## 2013-02-09 DIAGNOSIS — M899 Disorder of bone, unspecified: Secondary | ICD-10-CM | POA: Diagnosis present

## 2013-02-09 DIAGNOSIS — S21209A Unspecified open wound of unspecified back wall of thorax without penetration into thoracic cavity, initial encounter: Secondary | ICD-10-CM | POA: Insufficient documentation

## 2013-02-09 DIAGNOSIS — R5381 Other malaise: Secondary | ICD-10-CM

## 2013-02-09 DIAGNOSIS — K219 Gastro-esophageal reflux disease without esophagitis: Secondary | ICD-10-CM | POA: Diagnosis present

## 2013-02-09 DIAGNOSIS — Z79899 Other long term (current) drug therapy: Secondary | ICD-10-CM | POA: Insufficient documentation

## 2013-02-09 DIAGNOSIS — Y92009 Unspecified place in unspecified non-institutional (private) residence as the place of occurrence of the external cause: Secondary | ICD-10-CM | POA: Insufficient documentation

## 2013-02-09 DIAGNOSIS — I495 Sick sinus syndrome: Secondary | ICD-10-CM | POA: Diagnosis present

## 2013-02-09 DIAGNOSIS — E872 Acidosis, unspecified: Secondary | ICD-10-CM | POA: Diagnosis present

## 2013-02-09 DIAGNOSIS — I498 Other specified cardiac arrhythmias: Secondary | ICD-10-CM | POA: Diagnosis present

## 2013-02-09 DIAGNOSIS — F329 Major depressive disorder, single episode, unspecified: Secondary | ICD-10-CM | POA: Insufficient documentation

## 2013-02-09 DIAGNOSIS — I951 Orthostatic hypotension: Secondary | ICD-10-CM | POA: Diagnosis present

## 2013-02-09 DIAGNOSIS — R296 Repeated falls: Secondary | ICD-10-CM | POA: Insufficient documentation

## 2013-02-09 DIAGNOSIS — M810 Age-related osteoporosis without current pathological fracture: Secondary | ICD-10-CM | POA: Diagnosis present

## 2013-02-09 DIAGNOSIS — S22070A Wedge compression fracture of T9-T10 vertebra, initial encounter for closed fracture: Secondary | ICD-10-CM | POA: Diagnosis present

## 2013-02-09 DIAGNOSIS — K31811 Angiodysplasia of stomach and duodenum with bleeding: Secondary | ICD-10-CM | POA: Diagnosis present

## 2013-02-09 DIAGNOSIS — S22000A Wedge compression fracture of unspecified thoracic vertebra, initial encounter for closed fracture: Secondary | ICD-10-CM

## 2013-02-09 DIAGNOSIS — N390 Urinary tract infection, site not specified: Secondary | ICD-10-CM | POA: Diagnosis present

## 2013-02-09 DIAGNOSIS — K449 Diaphragmatic hernia without obstruction or gangrene: Secondary | ICD-10-CM | POA: Diagnosis present

## 2013-02-09 DIAGNOSIS — E538 Deficiency of other specified B group vitamins: Secondary | ICD-10-CM | POA: Diagnosis present

## 2013-02-09 DIAGNOSIS — K222 Esophageal obstruction: Secondary | ICD-10-CM | POA: Diagnosis present

## 2013-02-09 DIAGNOSIS — E785 Hyperlipidemia, unspecified: Secondary | ICD-10-CM | POA: Diagnosis present

## 2013-02-09 DIAGNOSIS — N3941 Urge incontinence: Secondary | ICD-10-CM | POA: Diagnosis present

## 2013-02-09 DIAGNOSIS — K225 Diverticulum of esophagus, acquired: Secondary | ICD-10-CM | POA: Diagnosis present

## 2013-02-09 DIAGNOSIS — K589 Irritable bowel syndrome without diarrhea: Secondary | ICD-10-CM | POA: Diagnosis present

## 2013-02-09 HISTORY — DX: Unspecified fracture of t9-t10 vertebra, initial encounter for closed fracture: S22.079A

## 2013-02-09 LAB — BASIC METABOLIC PANEL
BUN: 37 mg/dL — ABNORMAL HIGH (ref 6–23)
Chloride: 97 mEq/L (ref 96–112)
GFR calc Af Amer: 38 mL/min — ABNORMAL LOW (ref >90)
Glucose, Bld: 136 mg/dL — ABNORMAL HIGH (ref 70–99)
Potassium: 4.9 mEq/L (ref 3.5–5.1)

## 2013-02-09 LAB — CBC WITH DIFFERENTIAL/PLATELET
Basophils Absolute: 0 10*3/uL (ref 0.0–0.1)
Eosinophils Absolute: 0 10*3/uL (ref 0.0–0.7)
Eosinophils Relative: 0 % (ref 0–5)
Hemoglobin: 10.8 g/dL — ABNORMAL LOW (ref 12.0–15.0)
MCH: 29.9 pg (ref 26.0–34.0)
MCV: 93.6 fL (ref 78.0–100.0)
Platelets: 249 10*3/uL (ref 150–400)
RDW: 13.7 % (ref 11.5–15.5)

## 2013-02-09 MED ORDER — ACETAMINOPHEN 325 MG PO TABS
650.0000 mg | ORAL_TABLET | Freq: Once | ORAL | Status: AC
  Administered 2013-02-09: 650 mg via ORAL
  Filled 2013-02-09: qty 2

## 2013-02-09 MED ORDER — SODIUM CHLORIDE 0.9 % IV BOLUS (SEPSIS)
1000.0000 mL | Freq: Once | INTRAVENOUS | Status: AC
  Administered 2013-02-09: 1000 mL via INTRAVENOUS

## 2013-02-09 MED ORDER — ONDANSETRON HCL 4 MG/2ML IJ SOLN
4.0000 mg | Freq: Once | INTRAMUSCULAR | Status: DC
  Filled 2013-02-09: qty 2

## 2013-02-09 MED ORDER — ACETAMINOPHEN 500 MG PO TABS
1000.0000 mg | ORAL_TABLET | Freq: Once | ORAL | Status: AC
  Administered 2013-02-09: 1000 mg via ORAL
  Filled 2013-02-09: qty 2

## 2013-02-09 NOTE — ED Provider Notes (Signed)
CSN: [id number]     Arrival date & time 02/09/13  1243 History  This chart was scribed for Donnetta Hutching, MD by Bennett Scrape, ED Scribe. This patient was seen in room APA17/[facility]17 and the patient's care was started at 3:29 PM.   Chief Complaint  Patient presents with  . Fall    The history is provided by the patient. No language interpreter was used.    HPI Comments: Kaitlyn Cooper is a 79 y.o. female who presents to the Emergency Department complaining of a fall that occurred at her home this afternoon. She reports that she "just got weak and fell". She reports that this is the second episode but denies any frequent falls. She reports an associated skin tear to her upper back and right buttocks/right lower back pain currently.   PCP is Dr. [name]  Past Medical History  Diagnosis Date  . Anemia     followed by Dr.Neijstrom  . Hyperlipidemia   . Depression   . Hypothyroidism   . History of atherosclerotic cardiovascular disease   . History of colonoscopy     02/2002 showed few diverticula at sigmoid colon otherwise normal colonoscopy and terminal ileoscopy 1995 several colon polyps ,tubular adenoma  . GERD (gastroesophageal reflux disease)     chronic  . GERD (gastroesophageal reflux disease)     chronic gerd  . Tiredness    Past Surgical History  Procedure Laterality Date  . Insert / replace / remove pacemaker  01/17/2009    [employer]ude Cardiac Pacemaker   . Cardiac catheterization  1998  . Cholecystectomy    . 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 Esophagogastroduodenoscopy (egd) with esophageal dilation  03/22/2012    Procedure: ESOPHAGOGASTRODUODENOSCOPY (EGD) WITH ESOPHAGEAL DILATION;  Surgeon: Malissa Hippo, MD;  Location: [facility];  Service: Endoscopy;  Laterality: N/A;  730  . Colonoscopy N/A 12/29/2012    Procedure: COLONOSCOPY;  Surgeon: Malissa Hippo, MD;  Location: [facility];  Service: Endoscopy;  Laterality: N/A;  130    No family history on file. History  Substance Use Topics  . Smoking status: Former Smoker -- 1.00 packs/day for 40 years    Quit date: 08/25/1971  . Smokeless tobacco: Never Used  . Alcohol Use: No   No OB history provided.  Review of Systems  A complete 10 system review of systems was obtained and all systems are negative except as noted in the HPI and PMH.   Allergies  Penicillins  Home Medications   Current Outpatient Rx  Name  Route  Sig  Dispense  Refill  . acetaminophen (TYLENOL) 500 MG tablet   Oral   Take 500 mg by mouth every 6 (six) hours as needed. Pain         . Cholecalciferol (VITAMIN D) 400 UNITS capsule   Oral   Take 400 Units by mouth daily.           . cyanocobalamin (,VITAMIN B-12,) 1000 MCG/ML injection   Intramuscular   Inject 1,000 mcg into the muscle every 30 (thirty) days.         . Doxylamine Succinate, Sleep, (SLEEP AID PO)   Oral   Take 1 tablet by mouth at bedtime as needed (for sleep).         Marland Kitchen levothyroxine (SYNTHROID, LEVOTHROID) 75 MCG tablet   Oral   Take 1 tablet (75 mcg total) by mouth daily before breakfast.  30 tablet   5   . loperamide (IMODIUM A-D) 2 MG tablet   Oral   Take 1 tablet (2 mg total) by mouth daily.   1 tablet   0   . psyllium (METAMUCIL SMOOTH TEXTURE) 28 % packet   Oral   Take 1 packet by mouth at bedtime.         . ranitidine (ZANTAC) 150 MG tablet   Oral   Take 150 mg by mouth 2 (two) times daily. For acid reflux         . sertraline (ZOLOFT) 100 MG tablet   Oral   Take 100 mg by mouth every morning.          Triage Vitals: BP 148/55  Pulse 81  Resp 20  SpO2 100%  Physical Exam  Nursing note and vitals reviewed. Constitutional: She is oriented to person, place, and time. She appears well-developed and well-nourished.  HENT:  Head: Normocephalic and atraumatic.  Eyes: Conjunctivae and EOM are normal. Pupils are equal, round, and reactive to light.  Neck: Normal range of  motion. Neck supple.  Cardiovascular: Normal rate, regular rhythm and normal heart sounds.   Pulmonary/Chest: Effort normal and breath sounds normal.  Abdominal: Soft. Bowel sounds are normal.  Musculoskeletal: Normal range of motion.  Tenderness over the mid thoracic spine, lumbar spine and right hip.   Neurological: She is alert and oriented to person, place, and time.  Skin: Skin is warm and dry.  Upper back has a 5 by 1.5 cm skin tear  Psychiatric: She has a normal mood and affect. Her behavior is normal.    ED Course  Procedures (including critical care time)  Medications  acetaminophen (TYLENOL) tablet 650 mg (not administered)    DIAGNOSTIC STUDIES: Oxygen Saturation is 100% on room air, normal by my interpretation.    COORDINATION OF CARE: 3:30 PM-Discussed treatment plan which includes x-rays of the back and right hip with pt at bedside and pt agreed to plan. Pt is requesting Tylenol for pain.  Labs Review Labs Reviewed - No data to display Imaging Review Dg Thoracic Spine 2 View  02/09/2013   CLINICAL DATA:  Back pain post fall today  EXAM: THORACIC SPINE - 2 VIEW  COMPARISON:  Chest radiographs 220 T6 2014  FINDINGS: Twelve pairs of ribs.  Diffuse osseous demineralization.  Dextro convex thoracolumbar scoliosis apex T11/T12.  Marked compression deformity of the inferior endplate of T9 demonstrating approximately 50% anterior height loss, new since previous exam.  No additional fracture or dislocation identified.  Calcified AP window and left hilar adenopathy.  Extensive atherosclerotic calcification aorta.  Biapical pleural parenchymal lung scarring unchanged.  IMPRESSION: Osseous demineralization with new inferior endplate compression fracture of T9 vertebral body since 04/14/2012, demonstrating approximately 50% anterior height loss.   Electronically Signed   By: Ulyses Southward M.D.   On: 02/09/2013 16:41   Dg Lumbar Spine Complete  02/09/2013   CLINICAL DATA:  Back pain  post fall today  EXAM: LUMBAR SPINE - COMPLETE 4+ VIEW  COMPARISON:  03/07/2005  FINDINGS: Five non-rib-bearing lumbar vertebrae.  Levoconvex lumbar scoliosis apex L2.  Scattered facet degenerative changes.  Height loss of the T9 vertebral body as reported on thoracic radiographs.  Disc space narrowing with endplate spur formation at L1-L2 and L2-L3.  No lumbar fracture or subluxation.  No spondylolysis.  Extensive atherosclerotic calcification of the aorta, iliac arteries and abdominal branch vessels.  SI joints symmetric.  IMPRESSION: Osseous demineralization with degenerative  disc and facet disease changes of the lumbar spine with associated levoconvex lumbar scoliosis.  No acute abnormalities.   Electronically Signed   By: Ulyses Southward M.D.   On: 02/09/2013 16:43    EKG Interpretation   None       MDM  No diagnosis found. X-rays of thoracic and lumbar spine show a 50% T9 compression fracture. Discussed with patient and her family. She has primary care followup  I personally performed the services described in this documentation, which was scribed in my presence. The recorded information has been reviewed and is accurate.    Donnetta Hutching, MD 02/09/13 [phone]

## 2013-02-09 NOTE — ED Notes (Signed)
Patient c/o feelilng tired all the time; patient states she thinks this is why she is falling at home.  Patient's sister is with her.  Patient is A&O; skin w/d. Respirations even and unlabored; able to speak in complete sentences without difficulty.  Patient has equal hand grips and pedal pushes.  Patient's face is symmetrical, tongue midline, and patient follows commands.

## 2013-02-09 NOTE — H&P (Signed)
[facility] History and Physical  Kaitlyn Cooper  WUJ:[id number]  DOB: March 25, 1926   DOA: 02/09/2013   PCP:   [name], MD   Chief Complaint:   weakness and falls  HPI: Kaitlyn Cooper is a 79 y.o. female.   Elderly Caucasian lady who lives alone, has a history of chronic unexplained diarrhea, and now progressive weakness, evaluated with colonoscopy by Dr. Dionicia Abler and maintained on Imodium and Metamucil. Has been feeling more weak than usual centrally, and has episodes where her legs suddenly give way. She is usually able to assist ambulate without the assistance of any device. Today she had a sudden attack and fell and injured her back, due to her head but had no loss of consciousness. She was able to crawl to the phone and called her sister and niece.  In the emergency evaluated in the emergency room early today found to have compression fracture of t9, and abrasion over the thoracic spine, and discharged home.. returns this evening complaining of being too weak and having diarrhea at home and is addicted for further evaluation.  Patient reports that she has urge incontinence of stool. Associated with numbness and tingling in her lower extremity and episodic difficulty walking and falling.  She has chronic unexplained anemia and is treated with monthly B12 injections. She has cravings for ice however her serum iron and ferritin are not markedly low. She does appear to have a remote history of globulin abnormality    Rewiew of Systems:   All systems negative except as marked bold or noted in the HPI;  Constitutional:    malaise, fever and chills. ;  Eyes:   eye pain, redness and discharge. ;  ENMT:   ear pain, hoarseness, nasal congestion, sinus pressure and sore throat. ;  Hard of hearing Cardiovascular:    chest pain, palpitations, diaphoresis, dyspnea and peripheral edema.  Respiratory:   cough, hemoptysis, wheezing and stridor. ;  Gastrointestinal:  nausea, vomiting,  diarrhea, constipation, abdominal pain, melena, blood in stool, hematemesis, jaundice and rectal bleeding. unusual weight loss..   Genitourinary:    frequency, dysuria, incontinence,flank pain and hematuria; Musculoskeletal:   back pain due to trauma  and neck pain.    Skin: .  pruritus, rash, abrasions upper back , bruising and skin lesion.; ulcerations Neuro:    headache, lightheadedness and neck stiffness.  weakness, altered level of consciousness, altered mental status, extremity weakness, burning feet, involuntary movement, seizure and syncope.  Psych:    anxiety, depression, insomnia, tearfulness, panic attacks, hallucinations, paranoia, suicidal or homicidal ideation    Past Medical History  Diagnosis Date  . Anemia     followed by Dr.Neijstrom  . Hyperlipidemia   . Depression   . Hypothyroidism   . History of atherosclerotic cardiovascular disease   . History of colonoscopy     02/2002 showed few diverticula at sigmoid colon otherwise normal colonoscopy and terminal ileoscopy 1995 several colon polyps ,tubular adenoma  . GERD (gastroesophageal reflux disease)     chronic  . GERD (gastroesophageal reflux disease)     chronic gerd  . Tiredness     Past Surgical History  Procedure Laterality Date  . Insert / replace / remove pacemaker  01/17/2009    St.Jude Cardiac Pacemaker   . Cardiac catheterization  1998  . Cholecystectomy    . 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 Esophagogastroduodenoscopy (egd) with esophageal dilation  03/22/2012    Procedure: ESOPHAGOGASTRODUODENOSCOPY (  EGD) WITH ESOPHAGEAL DILATION;  Surgeon: Malissa Hippo, MD;  Location: [facility];  Service: Endoscopy;  Laterality: N/A;  730  . Colonoscopy N/A 12/29/2012    Procedure: COLONOSCOPY;  Surgeon: Malissa Hippo, MD;  Location: [facility];  Service: Endoscopy;  Laterality: N/A;  130    Medications:  HOME MEDS: Prior to Admission medications   Medication Sig  Start Date End Date Taking? Authorizing Provider  acetaminophen (TYLENOL) 500 MG tablet Take 500 mg by mouth every 6 (six) hours as needed. Pain   Yes Historical Provider, MD  Cholecalciferol (VITAMIN D) 400 UNITS capsule Take 400 Units by mouth daily.     Yes Historical Provider, MD  cyanocobalamin (,VITAMIN B-12,) 1000 MCG/ML injection Inject 1,000 mcg into the muscle every 30 (thirty) days.   Yes Historical Provider, MD  Doxylamine Succinate, Sleep, (SLEEP AID PO) Take 1 tablet by mouth at bedtime as needed (for sleep).   Yes Historical Provider, MD  levothyroxine (SYNTHROID, LEVOTHROID) 75 MCG tablet Take 1 tablet (75 mcg total) by mouth daily before breakfast. 02/07/13  Yes Babs Sciara, MD  loperamide (IMODIUM A-D) 2 MG tablet Take 1 tablet (2 mg total) by mouth daily. 12/29/12  Yes Malissa Hippo, MD  psyllium (METAMUCIL SMOOTH TEXTURE) 28 % packet Take 1 packet by mouth at bedtime. 12/29/12  Yes Malissa Hippo, MD  ranitidine (ZANTAC) 150 MG tablet Take 150 mg by mouth 2 (two) times daily. For acid reflux   Yes Historical Provider, MD  sertraline (ZOLOFT) 100 MG tablet Take 100 mg by mouth every morning.   Yes Historical Provider, MD     Allergies:  Allergies  Allergen Reactions  . Penicillins Rash    Social History:   reports that she quit smoking about 41 years ago. She has never used smokeless tobacco. She reports that she does not drink alcohol or use illicit drugs.  Family History: No family history on file.   Physical Exam: Filed Vitals:   02/09/13 2136  BP: 115/61  Pulse: 68  Temp: 97.9 F (36.6 C)  TempSrc: Oral  Resp: 18  Height: 5' 3.5" (1.613 m)  Weight: 46.72 kg (103 lb)  SpO2: 92%   Blood pressure 115/61, pulse 68, temperature 97.9 F (36.6 C), temperature source Oral, resp. rate 18, height 5' 3.5" (1.613 m), weight 46.72 kg (103 lb), SpO2 92.00%. Body mass index is 17.96 kg/(m^2).   GEN:  Pleasant  but thin elderly Caucasian lady lying bed, hearing  surprisingly adequate despite not having her hearing aid;  in no acute distress; cooperative with exam PSYCH:  alert and oriented;  anxious nor depressed; affect is appropriate. HEENT: Mucous membranes pink dry and anicteric; PERRLA; EOM intact; no cervical lymphadenopathy nor thyromegaly or carotid bruit; no JVD; Breasts:: Not examined CHEST WALL: No tenderness CHEST: Normal respiration, clear to auscultation bilaterally HEART: Regular rate and rhythm; no murmurs rubs or gallops BACK:  marked kyphosis slight scoliosis; no CVA tenderness; covered abrasion over the mid thoracic spine  ABDOMEN:, soft non-tender; no masses, no organomegaly, normal abdominal bowel sounds; no pannus; no intertriginous candida. Rectal Exam: Not done EXTREMITIES:  age-appropriate arthropathy of the hands and knees; no edema; no ulcerations. Genitalia: not examined PULSES: 2+ and symmetric SKIN: Normal hydration no rash or ulceration CNS: Cranial nerves 2-12 grossly intact no focal lateralizing neurologic deficit   Labs on Admission:  Basic Metabolic Panel:  Recent Labs Lab 02/09/13 2155  NA 134*  K 4.9  CL  97  CO2 20  GLUCOSE 136*  BUN 37*  CREATININE 1.41*  CALCIUM 10.1   Liver Function Tests: No results found for this basename: AST, ALT, ALKPHOS, BILITOT, PROT, ALBUMIN,  in the last 168 hours No results found for this basename: LIPASE, AMYLASE,  in the last 168 hours No results found for this basename: AMMONIA,  in the last 168 hours CBC:  Recent Labs Lab 02/09/13 2155  WBC 11.7*  NEUTROABS 9.3*  HGB 10.8*  HCT 33.8*  MCV 93.6  PLT 249   Cardiac Enzymes: No results found for this basename: CKTOTAL, CKMB, CKMBINDEX, TROPONINI,  in the last 168 hours BNP: No components found with this basename: POCBNP,  D-dimer: No components found with this basename: D-DIMER,  CBG: No results found for this basename: GLUCAP,  in the last 168 hours  Radiological Exams on Admission: Dg Thoracic Spine  2 View  02/09/2013   CLINICAL DATA:  Back pain post fall today  EXAM: THORACIC SPINE - 2 VIEW  COMPARISON:  Chest radiographs 220 T6 2014  FINDINGS: Twelve pairs of ribs.  Diffuse osseous demineralization.  Dextro convex thoracolumbar scoliosis apex T11/T12.  Marked compression deformity of the inferior endplate of T9 demonstrating approximately 50% anterior height loss, new since previous exam.  No additional fracture or dislocation identified.  Calcified AP window and left hilar adenopathy.  Extensive atherosclerotic calcification aorta.  Biapical pleural parenchymal lung scarring unchanged.  IMPRESSION: Osseous demineralization with new inferior endplate compression fracture of T9 vertebral body since 04/14/2012, demonstrating approximately 50% anterior height loss.   Electronically Signed   By: Ulyses Southward M.D.   On: 02/09/2013 16:41   Dg Lumbar Spine Complete  02/09/2013   CLINICAL DATA:  Back pain post fall today  EXAM: LUMBAR SPINE - COMPLETE 4+ VIEW  COMPARISON:  03/07/2005  FINDINGS: Five non-rib-bearing lumbar vertebrae.  Levoconvex lumbar scoliosis apex L2.  Scattered facet degenerative changes.  Height loss of the T9 vertebral body as reported on thoracic radiographs.  Disc space narrowing with endplate spur formation at L1-L2 and L2-L3.  No lumbar fracture or subluxation.  No spondylolysis.  Extensive atherosclerotic calcification of the aorta, iliac arteries and abdominal branch vessels.  SI joints symmetric.  IMPRESSION: Osseous demineralization with degenerative disc and facet disease changes of the lumbar spine with associated levoconvex lumbar scoliosis.  No acute abnormalities.   Electronically Signed   By: Ulyses Southward M.D.   On: 02/09/2013 16:43    EKG: Independently reviewed. Iatrial fibrillation occasional pacemaker spikes   Assessment/Plan   Active Problems:   HYPERLIPIDEMIA   ANEMIA   BRADYCARDIA   OSTEOPENIA   DIARRHEA, CHRONIC   DEPRESSION, HX OF   PPM-St.Jude    Weakness   Gastroenteritis   Chronic diarrhea   Wedge compression fracture of T9 vertebra   PLAN: Hydration Physical therapy Cardiac monitor to ensure pacemaker is functioning normally She will likely benefit from neurology evaluation given the history of lower extremity neurological symptoms associated with episodic bowel incontinence   Other plans as per orders.  Code Status: Full code Family Communication: Plans discuss with patient and sister and niece at bedside     Brenlee Koskela Nocturnist [facility] Pager [phone]   02/09/2013, 11:15 PM

## 2013-02-09 NOTE — ED Notes (Signed)
Patient medicated for continued back pain.  Patient has approximately 3 inch abrasion noted to middle upper back.

## 2013-02-09 NOTE — ED Notes (Addendum)
Pt c/o pain to mid back after falling on her back porch this am. Abrasion and skin tear to mid back noted-covered with non-stick pad. Small abrasion to left posterior shoulder also noted. Denies loc. nad noted.

## 2013-02-09 NOTE — ED Notes (Signed)
Patient presents to ER via [facility]; patient c/o generalized weakness and feeling tired for a while now.  Patient was seen earlier today for a fall and back pain.  Patient states that she has continued weakness.

## 2013-02-09 NOTE — ED Notes (Signed)
Patient has been given multiple blankets due to being cold.

## 2013-02-09 NOTE — ED Provider Notes (Signed)
CSN: [id number]     Arrival date & time 02/09/13  2134 History   First MD Initiated Contact with Patient 02/09/13 2142     Chief Complaint  Patient presents with  . Fatigue   (Consider location/radiation/quality/duration/timing/severity/associated sxs/prior Treatment) HPI.... patient evaluated early in the day for an accidental mechanical fall.   She had a T9 compression fracture.   She returns now with persistent weakness and diarrhea today.   This was not discussed on her initial visit by the patient or her family members. No specific complaints other than weakness. Specifically, no fever, chills, dysuria, chest pain, dyspnea. Severity is moderate.  Past Medical History  Diagnosis Date  . Anemia     followed by Dr.Neijstrom  . Hyperlipidemia   . Depression   . Hypothyroidism   . History of atherosclerotic cardiovascular disease   . History of colonoscopy     02/2002 showed few diverticula at sigmoid colon otherwise normal colonoscopy and terminal ileoscopy 1995 several colon polyps ,tubular adenoma  . GERD (gastroesophageal reflux disease)     chronic  . GERD (gastroesophageal reflux disease)     chronic gerd  . Tiredness    Past Surgical History  Procedure Laterality Date  . Insert / replace / remove pacemaker  01/17/2009    St.Jude Cardiac Pacemaker   . Cardiac catheterization  1998  . Cholecystectomy    . 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 Esophagogastroduodenoscopy (egd) with esophageal dilation  03/22/2012    Procedure: ESOPHAGOGASTRODUODENOSCOPY (EGD) WITH ESOPHAGEAL DILATION;  Surgeon: Malissa Hippo, MD;  Location: [facility];  Service: Endoscopy;  Laterality: N/A;  730  . Colonoscopy N/A 12/29/2012    Procedure: COLONOSCOPY;  Surgeon: Malissa Hippo, MD;  Location: [facility];  Service: Endoscopy;  Laterality: N/A;  130   No family history on file. History  Substance Use Topics  . Smoking status: Former Smoker -- 1.00  packs/day for 40 years    Quit date: 08/25/1971  . Smokeless tobacco: Never Used  . Alcohol Use: No   OB History   Grav Para Term Preterm Abortions TAB SAB Ect Mult Living                 Review of Systems  All other systems reviewed and are negative.    Allergies  Penicillins  Home Medications   Current Outpatient Rx  Name  Route  Sig  Dispense  Refill  . acetaminophen (TYLENOL) 500 MG tablet   Oral   Take 500 mg by mouth every 6 (six) hours as needed. Pain         . Cholecalciferol (VITAMIN D) 400 UNITS capsule   Oral   Take 400 Units by mouth daily.           . cyanocobalamin (,VITAMIN B-12,) 1000 MCG/ML injection   Intramuscular   Inject 1,000 mcg into the muscle every 30 (thirty) days.         . Doxylamine Succinate, Sleep, (SLEEP AID PO)   Oral   Take 1 tablet by mouth at bedtime as needed (for sleep).         Marland Kitchen levothyroxine (SYNTHROID, LEVOTHROID) 75 MCG tablet   Oral   Take 1 tablet (75 mcg total) by mouth daily before breakfast.   30 tablet   5   . loperamide (IMODIUM A-D) 2 MG tablet   Oral   Take 1 tablet (2 mg total) by mouth  daily.   1 tablet   0   . psyllium (METAMUCIL SMOOTH TEXTURE) 28 % packet   Oral   Take 1 packet by mouth at bedtime.         . ranitidine (ZANTAC) 150 MG tablet   Oral   Take 150 mg by mouth 2 (two) times daily. For acid reflux         . sertraline (ZOLOFT) 100 MG tablet   Oral   Take 100 mg by mouth every morning.          BP 115/61  Pulse 68  Temp(Src) 97.9 F (36.6 C) (Oral)  Resp 18  Ht 5' 3.5" (1.613 m)  Wt 103 lb (46.72 kg)  BMI 17.96 kg/m2  SpO2 92% Physical Exam  Nursing note and vitals reviewed. Constitutional: She is oriented to person, place, and time.  Frail appearing, good color  HENT:  Head: Normocephalic and atraumatic.  Eyes: Conjunctivae and EOM are normal. Pupils are equal, round, and reactive to light.  Neck: Normal range of motion. Neck supple.  Cardiovascular: Normal  rate, regular rhythm and normal heart sounds.   Pulmonary/Chest: Effort normal and breath sounds normal.  Abdominal: Soft. Bowel sounds are normal.  Musculoskeletal:  Tender surrounding thoracic 9 vertebra  Neurological: She is alert and oriented to person, place, and time.  Skin: Skin is warm and dry.  Psychiatric: She has a normal mood and affect. Her behavior is normal.    ED Course  Procedures (including critical care time) Labs Review Labs Reviewed  CBC WITH DIFFERENTIAL - Abnormal; Notable for the following:    WBC 11.7 (*)    RBC 3.61 (*)    Hemoglobin 10.8 (*)    HCT 33.8 (*)    Neutrophils Relative % 80 (*)    Neutro Abs 9.3 (*)    All other components within normal limits  BASIC METABOLIC PANEL - Abnormal; Notable for the following:    Sodium 134 (*)    Glucose, Bld 136 (*)    BUN 37 (*)    Creatinine, Ser 1.41 (*)    GFR calc non Af Amer 33 (*)    GFR calc Af Amer 38 (*)    All other components within normal limits  URINALYSIS, ROUTINE W REFLEX MICROSCOPIC   Imaging Review Dg Thoracic Spine 2 View  02/09/2013   CLINICAL DATA:  Back pain post fall today  EXAM: THORACIC SPINE - 2 VIEW  COMPARISON:  Chest radiographs 220 T6 2014  FINDINGS: Twelve pairs of ribs.  Diffuse osseous demineralization.  Dextro convex thoracolumbar scoliosis apex T11/T12.  Marked compression deformity of the inferior endplate of T9 demonstrating approximately 50% anterior height loss, new since previous exam.  No additional fracture or dislocation identified.  Calcified AP window and left hilar adenopathy.  Extensive atherosclerotic calcification aorta.  Biapical pleural parenchymal lung scarring unchanged.  IMPRESSION: Osseous demineralization with new inferior endplate compression fracture of T9 vertebral body since 04/14/2012, demonstrating approximately 50% anterior height loss.   Electronically Signed   By: Ulyses Southward M.D.   On: 02/09/2013 16:41   Dg Lumbar Spine Complete  02/09/2013    CLINICAL DATA:  Back pain post fall today  EXAM: LUMBAR SPINE - COMPLETE 4+ VIEW  COMPARISON:  03/07/2005  FINDINGS: Five non-rib-bearing lumbar vertebrae.  Levoconvex lumbar scoliosis apex L2.  Scattered facet degenerative changes.  Height loss of the T9 vertebral body as reported on thoracic radiographs.  Disc space narrowing with endplate spur formation at  L1-L2 and L2-L3.  No lumbar fracture or subluxation.  No spondylolysis.  Extensive atherosclerotic calcification of the aorta, iliac arteries and abdominal branch vessels.  SI joints symmetric.  IMPRESSION: Osseous demineralization with degenerative disc and facet disease changes of the lumbar spine with associated levoconvex lumbar scoliosis.  No acute abnormalities.   Electronically Signed   By: Ulyses Southward M.D.   On: 02/09/2013 16:43     Date: 02/09/2013  Rate: 71  Rhythm: atrial fib  QRS Axis: normal  Intervals: normal  ST/T Wave abnormalities: normal  Conduction Disutrbances: none  Narrative Interpretation: unremarkable      MDM   1. Gastroenteritis    Admit to observation for hydration.  no clinical evidence of a CVA.  Urinalysis pending. Discussed with Dr. Stark Jock, MD 02/09/13 2322

## 2013-02-10 ENCOUNTER — Encounter (HOSPITAL_COMMUNITY): Payer: Self-pay | Admitting: Emergency Medicine

## 2013-02-10 DIAGNOSIS — I959 Hypotension, unspecified: Secondary | ICD-10-CM

## 2013-02-10 DIAGNOSIS — K529 Noninfective gastroenteritis and colitis, unspecified: Secondary | ICD-10-CM | POA: Diagnosis present

## 2013-02-10 DIAGNOSIS — E860 Dehydration: Secondary | ICD-10-CM | POA: Diagnosis present

## 2013-02-10 DIAGNOSIS — R197 Diarrhea, unspecified: Secondary | ICD-10-CM

## 2013-02-10 DIAGNOSIS — S22070A Wedge compression fracture of T9-T10 vertebra, initial encounter for closed fracture: Secondary | ICD-10-CM | POA: Diagnosis present

## 2013-02-10 LAB — HEMOGLOBIN A1C
Hgb A1c MFr Bld: 5.9 % — ABNORMAL HIGH (ref <5.7)
Mean Plasma Glucose: 123 mg/dL — ABNORMAL HIGH (ref <117)

## 2013-02-10 LAB — URINALYSIS, ROUTINE W REFLEX MICROSCOPIC
Bilirubin Urine: NEGATIVE
Glucose, UA: NEGATIVE mg/dL
Nitrite: POSITIVE — AB
Specific Gravity, Urine: 1.02 (ref 1.005–1.030)
pH: 5 (ref 5.0–8.0)

## 2013-02-10 LAB — BASIC METABOLIC PANEL
CO2: 18 mEq/L — ABNORMAL LOW (ref 19–32)
Chloride: 104 mEq/L (ref 96–112)
GFR calc Af Amer: 47 mL/min — ABNORMAL LOW (ref >90)
Potassium: 4.5 mEq/L (ref 3.5–5.1)
Sodium: 138 mEq/L (ref 135–145)

## 2013-02-10 LAB — CBC
HCT: 29.4 % — ABNORMAL LOW (ref 36.0–46.0)
Hemoglobin: 10.1 g/dL — ABNORMAL LOW (ref 12.0–15.0)
RBC: 3.15 MIL/uL — ABNORMAL LOW (ref 3.87–5.11)
WBC: 10.3 10*3/uL (ref 4.0–10.5)

## 2013-02-10 LAB — URINE MICROSCOPIC-ADD ON

## 2013-02-10 LAB — TSH: TSH: 0.207 u[IU]/mL — ABNORMAL LOW (ref 0.350–4.500)

## 2013-02-10 MED ORDER — SODIUM CHLORIDE 0.45 % IV SOLN
INTRAVENOUS | Status: DC
  Administered 2013-02-10: 1000 mL via INTRAVENOUS

## 2013-02-10 MED ORDER — ACETAMINOPHEN 325 MG PO TABS
650.0000 mg | ORAL_TABLET | ORAL | Status: DC | PRN
  Administered 2013-02-10 – 2013-02-13 (×6): 650 mg via ORAL
  Filled 2013-02-10 (×6): qty 2

## 2013-02-10 MED ORDER — SODIUM CHLORIDE 0.9 % IV SOLN: INTRAVENOUS | Status: DC

## 2013-02-10 MED ORDER — IBUPROFEN 800 MG PO TABS
400.0000 mg | ORAL_TABLET | Freq: Four times a day (QID) | ORAL | Status: DC
  Administered 2013-02-10 – 2013-02-11 (×6): 400 mg via ORAL
  Filled 2013-02-10 (×7): qty 1

## 2013-02-10 MED ORDER — SODIUM CHLORIDE 0.9 % IV SOLN
INTRAVENOUS | Status: DC
  Administered 2013-02-10: 12:00:00 via INTRAVENOUS

## 2013-02-10 MED ORDER — LOPERAMIDE HCL 2 MG PO CAPS
2.0000 mg | ORAL_CAPSULE | Freq: Every day | ORAL | Status: DC
  Administered 2013-02-10: 2 mg via ORAL
  Filled 2013-02-10: qty 1

## 2013-02-10 MED ORDER — FAMOTIDINE 20 MG PO TABS
20.0000 mg | ORAL_TABLET | Freq: Two times a day (BID) | ORAL | Status: DC
  Administered 2013-02-10 – 2013-02-15 (×10): 20 mg via ORAL
  Filled 2013-02-10 (×12): qty 1

## 2013-02-10 MED ORDER — PSYLLIUM 95 % PO PACK
1.0000 | PACK | Freq: Every evening | ORAL | Status: DC | PRN
  Filled 2013-02-10 (×8): qty 1

## 2013-02-10 MED ORDER — LOPERAMIDE HCL 2 MG PO CAPS
2.0000 mg | ORAL_CAPSULE | ORAL | Status: DC | PRN
  Administered 2013-02-10 (×2): 2 mg via ORAL
  Filled 2013-02-10 (×2): qty 1

## 2013-02-10 MED ORDER — ONDANSETRON HCL 4 MG/2ML IJ SOLN: 4.0000 mg | INTRAMUSCULAR | Status: DC | PRN

## 2013-02-10 MED ORDER — SERTRALINE HCL 50 MG PO TABS
50.0000 mg | ORAL_TABLET | Freq: Every morning | ORAL | Status: DC
  Administered 2013-02-10 – 2013-02-15 (×5): 50 mg via ORAL
  Filled 2013-02-10 (×7): qty 1

## 2013-02-10 MED ORDER — SODIUM CHLORIDE 0.9 % IV BOLUS (SEPSIS)
250.0000 mL | Freq: Once | INTRAVENOUS | Status: AC
  Administered 2013-02-10: 250 mL via INTRAVENOUS

## 2013-02-10 MED ORDER — DOXYLAMINE SUCCINATE (SLEEP) 25 MG PO TABS: 25.0000 mg | ORAL_TABLET | Freq: Every day | ORAL | Status: DC

## 2013-02-10 MED ORDER — LEVOTHYROXINE SODIUM 75 MCG PO TABS
75.0000 ug | ORAL_TABLET | Freq: Every day | ORAL | Status: DC
  Administered 2013-02-10 – 2013-02-11 (×2): 75 ug via ORAL
  Filled 2013-02-10 (×2): qty 1

## 2013-02-10 MED ORDER — LEVOFLOXACIN IN D5W 250 MG/50ML IV SOLN
250.0000 mg | INTRAVENOUS | Status: DC
  Administered 2013-02-10 – 2013-02-13 (×4): 250 mg via INTRAVENOUS
  Filled 2013-02-10 (×5): qty 50

## 2013-02-10 NOTE — Progress Notes (Signed)
[facility] PROGRESS NOTE  Kaitlyn Cooper AVW:[id number] DOB: 01-17-1927 DOA: 02/09/2013 PCP: [name], MD  Brief narrative: 79 y/o female admitted with a complaint of weakness and falls. She has a h/o chronic diarrhea with no known etiology despite extensive work up. She presented to the ER in the morning after a fall, had xray performed and dianosed with a 50% compression fracture of T9- no medication changes made- discharged home. She returned in the evening due to 4 episodes of diarrhea and increased weakness. Found to be dehydrated with ARF and admitted.   Assessment/Plan: Principal Problem:   ARF- pre-renal/ orthostatic hypotension - continues to improve but significantly dehydrated as orthostatics still positive- cont to hydrate- switch fluids to NS, give a bolus of 250 cc and increase rate to 125 cc/hr- last ECHO 2010- mild RVH -no mention of left heart failure   Active Problems:  Acute on chronic diarrhea - doubt gastroenteritis- missed her Loperamide yesterday due to issues with fall- stress and pain may have also exacerbated the diarrhea - resume home Loperamide and Psyllium and follow   Fall - in setting of orthostatic hypotension, UTI with recent complaints of weakness as outpt as well - has been followed closely by PCP - place on tele to monitor for arrhythmias while she is here - PT eval- may need SNF or at the least assisted living    Wedge compression fracture of T9 vertebra - possibly acute due to fall but difficult to tell as we have no old xrays to compare with - upon exam, it is also difficult to discern there is focal tenderness at T9 as she feels pain through and mid spine when palpated - add Motrin as Tylenol not sufficient to control pain- follow renal function carefully as she is dehydrated - I would like to avoid Narcotics if possible in this 79 y/o female who is already prone to falls - If pain is significant, may need to consider kyphoplasty- would  need an MRI to further assist in management- discussed in detail with patient   GERD - cont H2 blocker  UTI - f/u culture - Start Levaquin 250 mg daily and follow     ANEMIA - stable     DEPRESSION, HX OF - cont zoloft    PPM-St.Jude - stable - place on tele- no need for interrogation    Code Status: Full code  Family Communication: none  Disposition Plan: base upon PT eval  Consultants:  none  Procedures:  none  Antibiotics:  none  DVT Prophylaxis:  SCDs   HPI/Subjective: Pain in back is significant and she was unable to sleep due to it despite the Tylenol. Last BM was before coming to the hospital.   Objective: Filed Vitals:   02/10/13 0008 02/10/13 0917 02/10/13 0920 02/10/13 0925  BP: 180/55 143/54 118/66 96/41  Pulse: 69 77    Temp: 97.9 F (36.6 C) 98.9 F (37.2 C)    TempSrc: Oral Oral    Resp:  18    Height: 5' 3.5" (1.613 m)     Weight: 46.9 kg (103 lb 6.3 oz)     SpO2: 95% 94%     No intake or output data in the 24 hours ending 02/10/13 1028  Exam:   General:  AAO x 3 , no distress  Cardiovascular: RRR, no murmurs   Respiratory: CTA b/l   Abdomen: soft, mild diffuse tenderness in upper abdomen, BS+  Ext: no c/c/e  Back: tender along lower cervical and  thoracic spine with tenderness throughout most of the rest of her back as well.   Data Reviewed: Basic Metabolic Panel:  Recent Labs Lab 02/09/13 2155 02/10/13 0516  NA 134* 138  K 4.9 4.5  CL 97 104  CO2 20 18*  GLUCOSE 136* 99  BUN 37* 33*  CREATININE 1.41* 1.19*  CALCIUM 10.1 8.8   Liver Function Tests: No results found for this basename: AST, ALT, ALKPHOS, BILITOT, PROT, ALBUMIN,  in the last 168 hours No results found for this basename: LIPASE, AMYLASE,  in the last 168 hours No results found for this basename: AMMONIA,  in the last 168 hours CBC:  Recent Labs Lab 02/09/13 2155 02/10/13 0516  WBC 11.7* 10.3  NEUTROABS 9.3*  --   HGB 10.8* 10.1*  HCT  33.8* 29.4*  MCV 93.6 93.3  PLT 249 228   Cardiac Enzymes: No results found for this basename: CKTOTAL, CKMB, CKMBINDEX, TROPONINI,  in the last 168 hours BNP (last 3 results) No results found for this basename: PROBNP,  in the last 8760 hours CBG: No results found for this basename: GLUCAP,  in the last 168 hours  No results found for this or any previous visit (from the past 240 hour(s)).   Studies: Dg Thoracic Spine 2 View  02/09/2013   CLINICAL DATA:  Back pain post fall today  EXAM: THORACIC SPINE - 2 VIEW  COMPARISON:  Chest radiographs 220 T6 2014  FINDINGS: Twelve pairs of ribs.  Diffuse osseous demineralization.  Dextro convex thoracolumbar scoliosis apex T11/T12.  Marked compression deformity of the inferior endplate of T9 demonstrating approximately 50% anterior height loss, new since previous exam.  No additional fracture or dislocation identified.  Calcified AP window and left hilar adenopathy.  Extensive atherosclerotic calcification aorta.  Biapical pleural parenchymal lung scarring unchanged.  IMPRESSION: Osseous demineralization with new inferior endplate compression fracture of T9 vertebral body since 04/14/2012, demonstrating approximately 50% anterior height loss.   Electronically Signed   By: Ulyses Southward M.D.   On: 02/09/2013 16:41   Dg Lumbar Spine Complete  02/09/2013   CLINICAL DATA:  Back pain post fall today  EXAM: LUMBAR SPINE - COMPLETE 4+ VIEW  COMPARISON:  03/07/2005  FINDINGS: Five non-rib-bearing lumbar vertebrae.  Levoconvex lumbar scoliosis apex L2.  Scattered facet degenerative changes.  Height loss of the T9 vertebral body as reported on thoracic radiographs.  Disc space narrowing with endplate spur formation at L1-L2 and L2-L3.  No lumbar fracture or subluxation.  No spondylolysis.  Extensive atherosclerotic calcification of the aorta, iliac arteries and abdominal branch vessels.  SI joints symmetric.  IMPRESSION: Osseous demineralization with degenerative  disc and facet disease changes of the lumbar spine with associated levoconvex lumbar scoliosis.  No acute abnormalities.   Electronically Signed   By: Ulyses Southward M.D.   On: 02/09/2013 16:43    Scheduled Meds: . famotidine  20 mg Oral BID  . ibuprofen  400 mg Oral Q6H  . levothyroxine  75 mcg Oral QAC breakfast  . loperamide  2 mg Oral Daily  . psyllium  1 packet Oral QHS,MR X 1  . sertraline  50 mg Oral q morning - 10a  . sodium chloride  250 mL Intravenous Once   Continuous Infusions: . sodium chloride      ________________________________________________________________________  Time spent in minutes: 35 min    [facility]  [facility] Pager [phone] If 8PM-8AM, please contact night-coverage at [email or web address], password [email or web address] 02/10/2013, 10:28 AM  LOS: 1  day

## 2013-02-10 NOTE — Plan of Care (Signed)
Hospitalist informed of family/pt concern that she may need esophagus stretched again.  Pt has hx of this need and feels like she's unable to swallow without feeling choked.  Pt ate very little then vommitted.

## 2013-02-11 ENCOUNTER — Inpatient Hospital Stay (HOSPITAL_COMMUNITY): Payer: Medicare Other

## 2013-02-11 DIAGNOSIS — R197 Diarrhea, unspecified: Secondary | ICD-10-CM

## 2013-02-11 DIAGNOSIS — R1310 Dysphagia, unspecified: Secondary | ICD-10-CM

## 2013-02-11 DIAGNOSIS — N179 Acute kidney failure, unspecified: Secondary | ICD-10-CM | POA: Diagnosis present

## 2013-02-11 DIAGNOSIS — K5289 Other specified noninfective gastroenteritis and colitis: Secondary | ICD-10-CM

## 2013-02-11 LAB — TSH: TSH: 0.398 u[IU]/mL (ref 0.350–4.500)

## 2013-02-11 LAB — T3, FREE: T3, Free: 2 pg/mL — ABNORMAL LOW (ref 2.3–4.2)

## 2013-02-11 LAB — BASIC METABOLIC PANEL
BUN: 18 mg/dL (ref 6–23)
CO2: 16 mEq/L — ABNORMAL LOW (ref 19–32)
Calcium: 8.3 mg/dL — ABNORMAL LOW (ref 8.4–10.5)
Chloride: 113 mEq/L — ABNORMAL HIGH (ref 96–112)
Creatinine, Ser: 0.92 mg/dL (ref 0.50–1.10)
GFR calc Af Amer: 64 mL/min — ABNORMAL LOW (ref >90)
GFR calc non Af Amer: 56 mL/min — ABNORMAL LOW (ref >90)
Glucose, Bld: 78 mg/dL (ref 70–99)
Sodium: 143 mEq/L (ref 135–145)

## 2013-02-11 MED ORDER — LEVOTHYROXINE SODIUM 50 MCG PO TABS
50.0000 ug | ORAL_TABLET | Freq: Every day | ORAL | Status: DC
  Administered 2013-02-12 – 2013-02-15 (×4): 50 ug via ORAL
  Filled 2013-02-11 (×5): qty 1

## 2013-02-11 MED ORDER — SODIUM CHLORIDE 0.9 % IV SOLN
INTRAVENOUS | Status: DC
  Administered 2013-02-11: 23:00:00 via INTRAVENOUS

## 2013-02-11 MED ORDER — PREDNISONE 20 MG PO TABS: 20.0000 mg | ORAL_TABLET | Freq: Every day | ORAL | Status: DC

## 2013-02-11 MED ORDER — LOPERAMIDE HCL 2 MG PO CAPS
2.0000 mg | ORAL_CAPSULE | Freq: Two times a day (BID) | ORAL | Status: DC
  Administered 2013-02-11 (×2): 2 mg via ORAL
  Filled 2013-02-11 (×6): qty 1

## 2013-02-11 MED ORDER — METHYLPREDNISOLONE SODIUM SUCC 40 MG IJ SOLR
40.0000 mg | Freq: Once | INTRAMUSCULAR | Status: AC
  Administered 2013-02-11: 40 mg via INTRAVENOUS
  Filled 2013-02-11: qty 1

## 2013-02-11 MED ORDER — HEPARIN SODIUM (PORCINE) 5000 UNIT/ML IJ SOLN
5000.0000 [IU] | Freq: Three times a day (TID) | INTRAMUSCULAR | Status: DC
  Administered 2013-02-11 – 2013-02-15 (×12): 5000 [IU] via SUBCUTANEOUS
  Filled 2013-02-11 (×14): qty 1

## 2013-02-11 MED ORDER — ENSURE COMPLETE PO LIQD
237.0000 mL | Freq: Two times a day (BID) | ORAL | Status: DC
  Administered 2013-02-11 – 2013-02-15 (×8): 237 mL via ORAL

## 2013-02-11 NOTE — Progress Notes (Addendum)
[facility]                                                                                Patient Demographics  Kaitlyn Cooper, is a 79 y.o. female, DOB - [birth year], ZOX:[id number]  Admit date - 02/09/2013   Admitting Physician Vania Rea, MD  Outpatient Primary MD for the patient is LUKING,SCOTT, MD  LOS - 2   Chief Complaint  Patient presents with  . Fatigue        Assessment & Plan    1.Acute on chronic diarrhea. Now much better no bowel movements in the last 24 hours. She has been resumed on home dose Imodium which she takes at home, gentle IV fluids continued overall feels much better.    2. Dehydration-acute renal failure-orthostatic hypotension - entry to diarrhea, all resolved with IV fluids, continue gentle hydration and monitor. Blood pressures are much better. Not orthostatic anymore.    3. Falls secondary to #1 and 2 above. She has some T-spine back pain and suggestion of T6 compression fracture on x-ray, minimal tenderness overlying T-spine, will discuss with neurosurgeon on call Dr. Danielle Dess. We'll increase activity, PT evaluation. May need a spine brace.    4. Some chronic dysphagia. History of dysphagia stricture. GI reconsulted. They'll follow if possible.    5.UTI - follow cultures stable on Levaquin which will be continued    6. Straight of depression stable on Zoloft.    7. History of tachybradycardia syndrome status post St. Jude pacemaker. Stable no acute issues on telemetry.    8.Hypothyroidism - TSH low, reduced synthroid   Lab Results  Component Value Date   TSH 0.207* 02/10/2013        Code Status: Full  Family Communication:    Disposition Plan: TBD   Procedures     Consults  neurosurgery Dr. Danielle Dess over the phone called   Medications  Scheduled Meds: . famotidine  20 mg Oral BID  . ibuprofen  400 mg Oral Q6H  . levofloxacin (LEVAQUIN) IV  250 mg Intravenous Q24H  . [START ON 02/12/2013]  levothyroxine  50 mcg Oral QAC breakfast  . psyllium  1 packet Oral QHS,MR X 1  . sertraline  50 mg Oral q morning - 10a   Continuous Infusions: . sodium chloride 35 mL/hr (02/11/13 0802)   PRN Meds:.acetaminophen, loperamide, ondansetron (ZOFRAN) IV  DVT Prophylaxis    Heparin   Lab Results  Component Value Date   PLT 228 02/10/2013    Antibiotics    Anti-infectives   Start     Dose/Rate Route Frequency Ordered Stop   02/10/13 1200  Levofloxacin (LEVAQUIN) IVPB 250 mg     250 mg 50 mL/hr over 60 Minutes Intravenous Every 24 hours 02/10/13 1052            Subjective:   Pincus Sanes today has, No headache, No chest pain, No abdominal pain - No Nausea, No new weakness tingling or numbness, No Cough - SOB. Mild back pain and some generalized weakness but feels better than before.  Objective:   Filed Vitals:   02/10/13 1402 02/10/13 2128 02/11/13 0445 02/11/13 0524  BP: 153/51 177/68  157/57  Pulse: 65 72  81  Temp: 98.2 F (36.8 C) 97.3 F (36.3 C)  98.3 F (36.8 C)  TempSrc: Oral Oral  Oral  Resp: 20 20  20   Height:      Weight:   47.2 kg (104 lb 0.9 oz)   SpO2: 95% 94%  94%    Wt Readings from Last 3 Encounters:  02/11/13 47.2 kg (104 lb 0.9 oz)  02/03/13 46.766 kg (103 lb 1.6 oz)  02/01/13 47.356 kg (104 lb 6.4 oz)     Intake/Output Summary (Last 24 hours) at 02/11/13 0850 Last data filed at 02/11/13 0736  Gross per 24 hour  Intake 2162.5 ml  Output    900 ml  Net 1262.5 ml    Exam Awake Alert, Oriented X 3, No new F.N deficits, Normal affect [place].AT,PERRAL Supple Neck,No JVD, No cervical lymphadenopathy appriciated.  Symmetrical Chest wall movement, Good air movement bilaterally, CTAB RRR,No Gallops,Rubs or new Murmurs, No Parasternal Heave +ve B.Sounds, Abd Soft, Non tender, No organomegaly appriciated, No rebound - guarding or rigidity. No Cyanosis, Clubbing or edema, No new Rash or bruise  , mild T spine tenderness   Data Review   Micro  Results No results found for this or any previous visit (from the past 240 hour(s)).  Radiology Reports Dg Thoracic Spine 2 View  02/09/2013   CLINICAL DATA:  Back pain post fall today  EXAM: THORACIC SPINE - 2 VIEW  COMPARISON:  Chest radiographs 220 T6 2014  FINDINGS: Twelve pairs of ribs.  Diffuse osseous demineralization.  Dextro convex thoracolumbar scoliosis apex T11/T12.  Marked compression deformity of the inferior endplate of T9 demonstrating approximately 50% anterior height loss, new since previous exam.  No additional fracture or dislocation identified.  Calcified AP window and left hilar adenopathy.  Extensive atherosclerotic calcification aorta.  Biapical pleural parenchymal lung scarring unchanged.  IMPRESSION: Osseous demineralization with new inferior endplate compression fracture of T9 vertebral body since 04/14/2012, demonstrating approximately 50% anterior height loss.   Electronically Signed   By: Ulyses Southward M.D.   On: 02/09/2013 16:41   Dg Lumbar Spine Complete  02/09/2013   CLINICAL DATA:  Back pain post fall today  EXAM: LUMBAR SPINE - COMPLETE 4+ VIEW  COMPARISON:  03/07/2005  FINDINGS: Five non-rib-bearing lumbar vertebrae.  Levoconvex lumbar scoliosis apex L2.  Scattered facet degenerative changes.  Height loss of the T9 vertebral body as reported on thoracic radiographs.  Disc space narrowing with endplate spur formation at L1-L2 and L2-L3.  No lumbar fracture or subluxation.  No spondylolysis.  Extensive atherosclerotic calcification of the aorta, iliac arteries and abdominal branch vessels.  SI joints symmetric.  IMPRESSION: Osseous demineralization with degenerative disc and facet disease changes of the lumbar spine with associated levoconvex lumbar scoliosis.  No acute abnormalities.   Electronically Signed   By: Ulyses Southward M.D.   On: 02/09/2013 16:43    CBC  Recent Labs Lab 02/09/13 2155 02/10/13 0516  WBC 11.7* 10.3  HGB 10.8* 10.1*  HCT 33.8* 29.4*  PLT 249  228  MCV 93.6 93.3  MCH 29.9 32.1  MCHC 32.0 34.4  RDW 13.7 13.8  LYMPHSABS 1.4  --   MONOABS 0.9  --   EOSABS 0.0  --   BASOSABS 0.0  --     Chemistries   Recent Labs Lab 02/09/13 2155 02/10/13 0516 02/11/13 0550  NA 134* 138 143  K 4.9 4.5 3.8  CL 97 104 113*  CO2 20 18* 16*  GLUCOSE 136* 99 78  BUN 37* 33* 18  CREATININE 1.41* 1.19* 0.92  CALCIUM 10.1 8.8 8.3*   ------------------------------------------------------------------------------------------------------------------ estimated creatinine clearance is 33.9 ml/min (by C-G formula based on Cr of 0.92). ------------------------------------------------------------------------------------------------------------------  Recent Labs  02/10/13 0516  HGBA1C 5.9*   ------------------------------------------------------------------------------------------------------------------ No results found for this basename: CHOL, HDL, LDLCALC, TRIG, CHOLHDL, LDLDIRECT,  in the last 72 hours ------------------------------------------------------------------------------------------------------------------  Recent Labs  02/10/13 0516  TSH 0.207*   ------------------------------------------------------------------------------------------------------------------ No results found for this basename: VITAMINB12, FOLATE, FERRITIN, TIBC, IRON, RETICCTPCT,  in the last 72 hours  Coagulation profile No results found for this basename: INR, PROTIME,  in the last 168 hours  No results found for this basename: DDIMER,  in the last 72 hours  Cardiac Enzymes No results found for this basename: CK, CKMB, TROPONINI, MYOGLOBIN,  in the last 168 hours ------------------------------------------------------------------------------------------------------------------ No components found with this basename: POCBNP,      Time Spent in minutes   35   Konya Fauble K M.D on 02/11/2013 at 8:50 AM  Between 7am to 7pm - Pager -  [phone]  After 7pm go to [email or web address] - password [email or web address]  And look for the night coverage person covering for me after hours  [facility] Office  [phone]

## 2013-02-11 NOTE — Plan of Care (Signed)
Barium swallow screen was ordered but Rad called and stated that it could not be completed till speech was available - which probably would not be today.  Dr. Truitt Merle.

## 2013-02-11 NOTE — Evaluation (Signed)
Physical Therapy Evaluation Patient Details Name: Kaitlyn Cooper MRN: [id number] DOB: Apr 26, 1926 Today's Date: 02/11/2013 Time: 2130-8657 PT Time Calculation (min): 30 min  PT Assessment / Plan / Recommendation History of Present Illness  Kaitlyn Cooper is a 79 y.o. female.   Elderly Caucasian lady who lives alone, has a history of chronic unexplained diarrhea, and now progressive weakness, evaluated with colonoscopy by Dr. Dionicia Abler and maintained on Imodium and Metamucil. Has been feeling more weak than usual centrally, and has episodes where her legs suddenly give way. She is usually able to assist ambulate without the assistance of any device. Today she had a sudden attack and fell and injured her back, due to her head but had no loss of consciousness. She was able to crawl to the phone and called her sister and niece.  Clinical Impression  Pt is an 79 yo female who shows decreased activity tolerance and generalized weakness.  PT will benefit from skilled therapy to progress pt to her prior functional status.  Discussed SNF but pt feels her sister will move in with her until she is stronger.    PT Assessment  Patient needs continued PT services    Follow Up Recommendations  Home health PT    Does the patient have the potential to tolerate intense rehabilitation    no  Barriers to Discharge  none      Equipment Recommendations  Rolling walker with 5" wheels    Recommendations for Other Services   OT  Frequency Min 3X/week    Precautions / Restrictions Precautions Precautions: None Restrictions Weight Bearing Restrictions: No   Pertinent Vitals/Pain 5/10 back      Mobility  Bed Mobility Details for Bed Mobility Assistance: Pt already in chair and nurse request pt to stay up in the chair. Transfers Transfers: Sit to Stand Sit to Stand: 6: Modified independent (Device/Increase time) Ambulation/Gait Ambulation/Gait Assistance: 6: Modified independent (Device/Increase  time) Ambulation Distance (Feet): 120 Feet Assistive device: Rolling walker Gait Pattern: Within Functional Limits        PT Diagnosis: Generalized weakness  PT Problem List: Decreased strength;Decreased activity tolerance;Decreased balance;Pain PT Treatment Interventions: Gait training;Therapeutic exercise     PT Goals(Current goals can be found in the care plan section) Acute Rehab PT Goals Patient Stated Goal: Pt feels that her sister will come live with her while she is getting HH  until she is safe to be by herself PT Goal Formulation: With patient Time For Goal Achievement: 02/14/13 Potential to Achieve Goals: Good  Visit Information  Last PT Received On: 02/11/13 History of Present Illness: Kaitlyn Cooper is a 79 y.o. female.   Elderly Caucasian lady who lives alone, has a history of chronic unexplained diarrhea, and now progressive weakness, evaluated with colonoscopy by Dr. Dionicia Abler and maintained on Imodium and Metamucil. Has been feeling more weak than usual centrally, and has episodes where her legs suddenly give way. She is usually able to assist ambulate without the assistance of any device. Today she had a sudden attack and fell and injured her back, due to her head but had no loss of consciousness. She was able to crawl to the phone and called her sister and niece.       Prior Functioning  Home Living Family/patient expects to be discharged to:: Private residence Living Arrangements: Alone Available Help at Discharge: Family Type of Home: House Home Access: Stairs to enter [employer] of Steps: 2 Entrance Stairs-Rails: Right Home Equipment: None Prior Function  Level of Independence: Independent Communication Communication: No difficulties    Cognition  Cognition Arousal/Alertness: Awake/alert Overall Cognitive Status: Within Functional Limits for tasks assessed    Extremity/Trunk Assessment Upper Extremity Assessment Upper Extremity Assessment:  Defer to OT evaluation Lower Extremity Assessment Lower Extremity Assessment: Generalized weakness   Balance    End of Session PT - End of Session Equipment Utilized During Treatment: Gait belt Activity Tolerance: Patient tolerated treatment well Patient left: in chair;with chair alarm set;with call bell/phone within reach  GP     Teruo Stilley,CINDY 02/11/2013, 9:37 AM

## 2013-02-11 NOTE — Progress Notes (Signed)
BPE reviewed with Dr. Tyron Russell earlier today. Multiple findings; Tiny Zenker diverticulum. Tertiary contractions indicative of motility disorder. Multiple diverticula at distal esophagus. Moderate size hiatal hernia. Barium pill got stuck in distal esophagus well above GEJ. The patient may benefit from esophageal dilation as she has in the past. Discussed findings with the patient and her sister and they're both agreeable to proceed with EGD/ED to be performed in a.m.

## 2013-02-11 NOTE — Care Management Note (Addendum)
    Page 1 of 2   02/15/2013     1:16:43 PM   CARE MANAGEMENT NOTE 02/15/2013  Patient:  Kaitlyn Cooper, Kaitlyn Cooper   Account Number:  [id number]  Date Initiated:  02/11/2013  Documentation initiated by:  Sharrie Rothman  Subjective/Objective Assessment:   Pt admitted from home with acute renal failure and weakness. Pt lives alone but has a sister that will be staying with pt for a short while at discharge. Pt has been independent with ADL's.     Action/Plan:   PT recommends HH PT at discharge and rolling walker. Pt has a walker to use from a family member and has chosen [facility] for [facility] Emma with [facility] will be notified of referral. Possible D/C 12/27. HH services to start within 48 hours of d/c.   Anticipated DC Date:  02/14/2013   Anticipated DC Plan:  HOME W HOME HEALTH SERVICES      DC Planning Services  CM consult      [facility] Choice  HOME HEALTH   Choice offered to / List presented to:  C-1 Patient        HH arranged  HH-1 RN  HH-2 PT      [facility] agency  [facility]   Status of service:  Completed, signed off Medicare Important Message given?  YES (If response is "NO", the following Medicare IM given date fields will be blank) Date Medicare IM given:  02/15/2013 Date Additional Medicare IM given:    Discharge Disposition:  HOME W HOME HEALTH SERVICES  Per UR Regulation:    If discussed at Long Length of Stay Meetings, dates discussed:    Comments:  02/15/13 1315 Arlyss Queen, RN BSN CM Pt discharged to [facility] today. CSW to arrange discharge to facility.  02/14/13 1410 Arlyss Queen, RN BSN CM Pt is now for SNF. CSW is aware and will initiate bed search.  02/11/13 1155 Arlyss Queen, RN BSN CM

## 2013-02-11 NOTE — Progress Notes (Signed)
UR chart review completed.  

## 2013-02-11 NOTE — Progress Notes (Signed)
INITIAL NUTRITION ASSESSMENT  DOCUMENTATION CODES Per approved criteria  -Underweight   INTERVENTION: Ensure Complete po BID, each supplement provides 350 kcal and 13 grams of protein Recommend pt continue with nutritional supplement at discharge.   NUTRITION DIAGNOSIS: Inadequate oral intake related to possible dysphagia as evidenced by PO: 5%, MBSS pending.   Goal: Pt will meet >90% of estimated nutritional needs  Monitor:  PO intake, diet advancement, MBSS results, labs, weight changes, changes in status  Reason for Assessment: MST=3  79 y.o. female  Admitting Dx: ARF (acute renal failure)  ASSESSMENT: Pt admitted for ARF, weakness, and falls. Pt recently suffered a 50% T9 compression fracture.  PO's have been poor recently (PO: 5%). Pt has had intermitted problems with dysphagia, due to esophageal stricture, and there is concern as to whether pt will need esophageal dilation again. SLP evaluated at beside and was able to tolerate all liquids and textures. MBSS pending. Noted progressive weight loss over the past few years, but weight has remained stable over the past year.  Pt was unavailable to nutrition focused physical exam.  Noted discharge disposition is for Home Health PT. Pt does not meet criteria for malnutrition at this time, but is at risk for malnutrition due to advanced age, weakness, and decreased PO's.   Height: Ht Readings from Last 1 Encounters:  02/10/13 5' 3.5" (1.613 m)    Weight: Wt Readings from Last 1 Encounters:  02/11/13 104 lb 0.9 oz (47.2 kg)    Ideal Body Weight: 115#  % Ideal Body Weight: 90%  Wt Readings from Last 10 Encounters:  02/11/13 104 lb 0.9 oz (47.2 kg)  02/03/13 103 lb 1.6 oz (46.766 kg)  02/01/13 104 lb 6.4 oz (47.356 kg)  12/31/12 107 lb 6 oz (48.705 kg)  12/29/12 107 lb (48.535 kg)  12/29/12 107 lb (48.535 kg)  12/16/12 106 lb 8 oz (48.308 kg)  12/14/12 107 lb 8 oz (48.762 kg)  11/25/12 107 lb 12.8 oz (48.898 kg)   07/19/12 105 lb (47.628 kg)    Usual Body Weight: 110#  % Usual Body Weight: 90%  BMI:  Body mass index is 18.14 kg/(m^2). Meets criteria for underweight.   Estimated Nutritional Needs: Kcal: 1100-1200 daily Protein: 38-47 grams daily Fluid: 1.1-1.2 L daily  Skin: skin tear on mid upper back  Diet Order: Full Liquid  EDUCATION NEEDS: -Education not appropriate at this time   Intake/Output Summary (Last 24 hours) at 02/11/13 1348 Last data filed at 02/11/13 0736  Gross per 24 hour  Intake 2162.5 ml  Output    900 ml  Net 1262.5 ml    Last BM: 02/10/13   Labs:   Recent Labs Lab 02/09/13 2155 02/10/13 0516 02/11/13 0550  NA 134* 138 143  K 4.9 4.5 3.8  CL 97 104 113*  CO2 20 18* 16*  BUN 37* 33* 18  CREATININE 1.41* 1.19* 0.92  CALCIUM 10.1 8.8 8.3*  GLUCOSE 136* 99 78    CBG (last 3)  No results found for this basename: GLUCAP,  in the last 72 hours  Scheduled Meds: . famotidine  20 mg Oral BID  . heparin subcutaneous  5,000 Units Subcutaneous Q8H  . ibuprofen  400 mg Oral Q6H  . levofloxacin (LEVAQUIN) IV  250 mg Intravenous Q24H  . [START ON 02/12/2013] levothyroxine  50 mcg Oral QAC breakfast  . loperamide  2 mg Oral BID  . psyllium  1 packet Oral QHS,MR X 1  . sertraline  50  mg Oral q morning - 10a    Continuous Infusions: . sodium chloride 35 mL/hr (02/11/13 0802)    Past Medical History  Diagnosis Date  . Anemia     followed by Dr.Neijstrom  . Hyperlipidemia   . Depression   . Hypothyroidism   . History of atherosclerotic cardiovascular disease   . History of colonoscopy     02/2002 showed few diverticula at sigmoid colon otherwise normal colonoscopy and terminal ileoscopy 1995 several colon polyps ,tubular adenoma  . GERD (gastroesophageal reflux disease)     chronic  . GERD (gastroesophageal reflux disease)     chronic gerd  . Tiredness     Past Surgical History  Procedure Laterality Date  . Insert / replace / remove  pacemaker  01/17/2009    [employer] Cardiac Pacemaker   . Cardiac catheterization  1998  . Cholecystectomy    . 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 Esophagogastroduodenoscopy (egd) with esophageal dilation  03/22/2012    Procedure: ESOPHAGOGASTRODUODENOSCOPY (EGD) WITH ESOPHAGEAL DILATION;  Surgeon: Malissa Hippo, MD;  [facility]: [facility];  Service: Endoscopy;  Laterality: N/A;  730  . Colonoscopy N/A 12/29/2012    Procedure: COLONOSCOPY;  Surgeon: Malissa Hippo, MD;  Location: [facility];  Service: Endoscopy;  Laterality: N/A;  130    Kaitlyn Cooper A. Mayford Knife, RD, LDN Pager: [phone]

## 2013-02-11 NOTE — Evaluation (Signed)
Clinical/Bedside Swallow Evaluation Patient Details  Name: Kaitlyn Cooper MRN: [id number] Date of Birth: 03-Apr-1926  Today's Date: 02/11/2013 Time: 0950-1005 SLP Time Calculation (min): 15 min  Past Medical History:  Past Medical History  Diagnosis Date  . Anemia     followed by Dr.Neijstrom  . Hyperlipidemia   . Depression   . Hypothyroidism   . History of atherosclerotic cardiovascular disease   . History of colonoscopy     02/2002 showed few diverticula at sigmoid colon otherwise normal colonoscopy and terminal ileoscopy 1995 several colon polyps ,tubular adenoma  . GERD (gastroesophageal reflux disease)     chronic  . GERD (gastroesophageal reflux disease)     chronic gerd  . Tiredness    Past Surgical History:  Past Surgical History  Procedure Laterality Date  . Insert / replace / remove pacemaker  01/17/2009    [employer] Cardiac Pacemaker   . Cardiac catheterization  1998  . Cholecystectomy    . 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 Esophagogastroduodenoscopy (egd) with esophageal dilation  03/22/2012    Procedure: ESOPHAGOGASTRODUODENOSCOPY (EGD) WITH ESOPHAGEAL DILATION;  Surgeon: Malissa Hippo, MD;  [facility];  Service: Endoscopy;  Laterality: N/A;  730  . Colonoscopy N/A 12/29/2012    Procedure: COLONOSCOPY;  Surgeon: Malissa Hippo, MD;  [facility]: [facility];  Service: Endoscopy;  Laterality: N/A;  130   HPI:  Kaitlyn Cooper is a 79 y.o. female.   Elderly Caucasian lady who lives alone, has a history of chronic unexplained diarrhea, and now progressive weakness, evaluated with colonoscopy by Dr. Dionicia Abler and maintained on Imodium and Metamucil. Has been feeling more weak than usual centrally, and has episodes where her legs suddenly give way. She is usually able to assist ambulate without the assistance of any device. Today she had a sudden attack and fell and injured her back, due to her head but had no loss of  consciousness. She was able to crawl to the phone and called her sister and niece.    Assessment / Plan / Recommendation Clinical Impression  Pt seen in room today for BSE. Pt was recently reduced to clear liquid diet due to pt with hx of esophageal stricture and dilation in 03/2012, with recent difficulty swallowing. BSE completed, pt reported her difficulty was spontaneous and today she did not have trouble with any texture given (ice chips, thin liquids, puree, solids). SLP encouraged pt to follow solids with liquid wash to aid in hydration and deglutition. Pt receptive. Communicated this with nursing and MD. Karilyn Cota to further assess with barium swallow today to evaluate esophageal motility. He will recommend appropriate diet.        Diet Recommendation Thin liquid   Medication Administration: Crushed with puree Supervision: Patient able to self feed Compensations: Follow solids with liquid;Small sips/bites Postural Changes and/or Swallow Maneuvers: Seated upright 90 degrees;Upright 30-60 min after meal    Other  Recommendations Recommended Consults: Consider GI evaluation;Consider esophageal assessment Oral Care Recommendations: Oral care BID   Follow Up Recommendations    Recommend f/u by SLP per results of esophageal study for any further ST related needs.         Swallow Study Prior Functional Status  Type of Home: House Available Help at Discharge: Family    General Date of Onset: 02/11/13 HPI: Kaitlyn Cooper is a 79 y.o. female.   Elderly Caucasian lady who lives alone, has a history of  chronic unexplained diarrhea, and now progressive weakness, evaluated with colonoscopy by Dr. Dionicia Abler and maintained on Imodium and Metamucil. Has been feeling more weak than usual centrally, and has episodes where her legs suddenly give way. She is usually able to assist ambulate without the assistance of any device. Today she had a sudden attack and fell and injured her back, due to her head but  had no loss of consciousness. She was able to crawl to the phone and called her sister and niece.  Type of Study: Bedside swallow evaluation Previous Swallow Assessment: Pt has had a hx of esophageal stricture and dilation, February 2014. Diet Prior to this Study: Thin liquids Temperature Spikes Noted: No Respiratory Status: Room air Behavior/Cognition: Alert;Cooperative;Pleasant mood Oral Cavity - Dentition: Adequate natural dentition Self-Feeding Abilities: Able to feed self;Needs assist Patient Positioning: Upright in chair Baseline Vocal Quality: Clear Volitional Cough: Strong Volitional Swallow: Able to elicit    Oral/Motor/Sensory Function Overall Oral Motor/Sensory Function: Appears within functional limits for tasks assessed   Ice Chips Ice chips: Within functional limits Presentation: Self Fed;Cup   Thin Liquid Thin Liquid: Within functional limits Presentation: Cup;Self Fed    Nectar Thick Nectar Thick Liquid: Not tested   Honey Thick Honey Thick Liquid: Not tested   Puree Puree: Within functional limits Presentation: Spoon   Solid   GO    Solid: Within functional limits Presentation: Self Fed       Evella Kasal S 02/11/2013,10:17 AM

## 2013-02-11 NOTE — Consult Note (Signed)
Referring Provider: No ref. provider found Primary Care Physician:  [name], MD Primary Gastroenterologist:  Dr. Karilyn Cota  Reason for Consultation:  Solid food dysphagia.  HPI:  Patient is an 79 year old Caucasian female who is well known to me. In the past she has been evaluated for dysphagia and more recently for diarrhea and weakness. Regarding her dysphagia she has history of esophageal ring and motility disorder. She has undergone 2 esophageal dilations in the past most recently in February this year. She underwent colonoscopy with biopsy 6 weeks ago. Biopsies are negative for microscopic colitis. It was felt the diarrhea was due to  IBS and she is done very well with low dose loperamide and Metamucil. She has been complaining of weakness for several months. She has anemia but it is chronic. We checked her iron studies and these are within normal limits. She has hypothyroidism and is on medication. Patient was admitted to hospitalist service 2 days ago for diarrhea and dehydration. She had one of those spells and fell and injured her back. She was seen in emergency room and discharged. She returned 2 hours later with nonbloody diarrhea and worsening weakness. This time she was felt to be dehydrated. She was hospitalized and begun on IV fluids. She believes she forgot to take loperamide. She did not experience nausea vomiting fever or chills. Since hospitalization she hasn't had any more diarrhea. Yesterday she choked while she was eating [place]. She eventually had spontaneous relief. She is tolerating liquids. She denies heartburn. She has had intermittent dysphagia for the past few weeks. She denies abdominal pain melena or rectal bleeding. She continues to complain of weakness. She's had this for several months. While she has constant weakness she has spells where it gets worse in her legs given she falls down. She had another falling episode 3 or 4 weeks ago. She denies paresthesias or  numbness in her lower extremities. Her sister states she does not drink fluids. Patient has lost 9 pounds this year. She was evaluated by Ms. Sid Falcon, SLP and bedside evaluation is within normal limits and she outpatient did not need modified barium swallow. Patient lives alone. Her niece lives next door and her sister lives in [place]. She lost her husband in August last year and she does not have any children. She has not driven in the last few weeks. She quit cigarette smoking over 30 years ago and she does not drink alcohol.   Past Medical History  Diagnosis Date  . Anemia     followed by Dr.Neijstrom  . Hyperlipidemia   . Depression   . Hypothyroidism   . History of atherosclerotic cardiovascular disease   . History of colonoscopy     02/2002 showed few diverticula at sigmoid colon otherwise normal colonoscopy and terminal ileoscopy 1995 several colon polyps ,tubular adenoma  . GERD (gastroesophageal reflux disease)     chronic  . GERD (gastroesophageal reflux disease)     chronic gerd  . Tiredness     Past Surgical History  Procedure Laterality Date  . Insert / replace / remove pacemaker  01/17/2009    [employer] Cardiac Pacemaker   . Cardiac catheterization  1998  . Cholecystectomy    . 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 Esophagogastroduodenoscopy (egd) with esophageal dilation  03/22/2012    Procedure: ESOPHAGOGASTRODUODENOSCOPY (EGD) WITH ESOPHAGEAL DILATION;  Surgeon: Malissa Hippo, MD;  Location: [facility];  Service: Endoscopy;  Laterality: N/A;  730  .  Colonoscopy N/A 12/29/2012    Procedure: COLONOSCOPY;  Surgeon: Malissa Hippo, MD;  Location: [facility];  Service: Endoscopy;  Laterality: N/A;  130    Prior to Admission medications   Medication Sig Start Date End Date Taking? Authorizing Provider  acetaminophen (TYLENOL) 500 MG tablet Take 500 mg by mouth every 6 (six) hours as needed. Pain   Yes Historical  Provider, MD  Cholecalciferol (VITAMIN D) 400 UNITS capsule Take 400 Units by mouth daily.     Yes Historical Provider, MD  cyanocobalamin (,VITAMIN B-12,) 1000 MCG/ML injection Inject 1,000 mcg into the muscle every 30 (thirty) days.   Yes Historical Provider, MD  Doxylamine Succinate, Sleep, (SLEEP AID PO) Take 1 tablet by mouth at bedtime as needed (for sleep).   Yes Historical Provider, MD  levothyroxine (SYNTHROID, LEVOTHROID) 75 MCG tablet Take 1 tablet (75 mcg total) by mouth daily before breakfast. 02/07/13  Yes Babs Sciara, MD  loperamide (IMODIUM A-D) 2 MG tablet Take 1 tablet (2 mg total) by mouth daily. 12/29/12  Yes Malissa Hippo, MD  psyllium (METAMUCIL SMOOTH TEXTURE) 28 % packet Take 1 packet by mouth at bedtime. 12/29/12  Yes Malissa Hippo, MD  ranitidine (ZANTAC) 150 MG tablet Take 150 mg by mouth 2 (two) times daily. For acid reflux   Yes Historical Provider, MD  sertraline (ZOLOFT) 100 MG tablet Take 100 mg by mouth every morning.   Yes Historical Provider, MD    Current Facility-Administered Medications  Medication Dose Route Frequency Provider Last Rate Last Dose  . 0.9 %  sodium chloride infusion   Intravenous Continuous Leroy Sea, MD 35 mL/hr at 02/11/13 0802 35 mL/hr at 02/11/13 0802  . acetaminophen (TYLENOL) tablet 650 mg  650 mg Oral Q4H PRN Vania Rea, MD   650 mg at 02/10/13 0834  . famotidine (PEPCID) tablet 20 mg  20 mg Oral BID Calvert Cantor, MD   20 mg at 02/10/13 2119  . heparin injection 5,000 Units  5,000 Units Subcutaneous Q8H Leroy Sea, MD      . ibuprofen (ADVIL,MOTRIN) tablet 400 mg  400 mg Oral Q6H Calvert Cantor, MD   400 mg at 02/10/13 2118  . Levofloxacin (LEVAQUIN) IVPB 250 mg  250 mg Intravenous Q24H Calvert Cantor, MD   250 mg at 02/10/13 1214  . [START ON 02/12/2013] levothyroxine (SYNTHROID, LEVOTHROID) tablet 50 mcg  50 mcg Oral QAC breakfast Leroy Sea, MD      . loperamide (IMODIUM) capsule 2 mg  2 mg Oral Q4H PRN  Leroy Sea, MD   2 mg at 02/10/13 2049  . ondansetron (ZOFRAN) injection 4 mg  4 mg Intravenous Q4H PRN Vania Rea, MD      . psyllium (HYDROCIL/METAMUCIL) packet 1 packet  1 packet Oral QHS,MR X 1 Saima Rizwan, MD      . sertraline (ZOLOFT) tablet 50 mg  50 mg Oral q morning - 10a Vania Rea, MD   50 mg at 02/10/13 2130    Allergies as of 02/09/2013 - Review Complete 02/09/2013  Allergen Reaction Noted  . Penicillins Rash 01/17/2009    History reviewed. No pertinent family history.  History   Social History  . Marital Status: Widowed    Spouse Name: N/A    Number of Children: N/A  . Years of Education: N/A   Occupational History  . Not on file.   Social History Main Topics  . Smoking status: Former Smoker -- 1.00 packs/day  for 40 years    Quit date: 08/25/1971  . Smokeless tobacco: Never Used  . Alcohol Use: No  . Drug Use: No  . Sexual Activity: Not on file   Other Topics Concern  . Not on file   Social History Narrative  . No narrative on file    Review of Systems: See HPI, otherwise normal ROS  Physical Exam: Temp:  [97.3 F (36.3 C)-98.3 F (36.8 C)] 98.3 F (36.8 C) (12/26 0524) Pulse Rate:  [65-81] 81 (12/26 0524) Resp:  [20] 20 (12/26 0524) BP: (153-177)/(51-68) 157/57 mmHg (12/26 0524) SpO2:  [94 %-95 %] 94 % (12/26 0524) Weight:  [104 lb 0.9 oz (47.2 kg)] 104 lb 0.9 oz (47.2 kg) (12/26 0445) Last BM Date: 02/10/13 Well-developed thin pleasant Caucasian female who is in NAD. Conjunctiva is pink. Sclerae nonicteric.  Oropharyngeal mucosa is normal.  Dentition in satisfactory condition. No neck masses or thyromegaly noted. She has slight kyphosis. She has area in right scapular region covered with dressing where she apparently had superficial skin tear when she fell. Pacemaker is located in the left pectoral region. Cardiac exam with regular rhythm normal S1 and S2. No murmur or gallop noted. Lungs are clear to  auscultation. Abdomen is symmetrical soft and nontender without organomegaly or masses. Extremities thin with few bruises over her upper extremities but no clubbing or edema noted. Patient is alert and responds appropriately to questions.       Lab Results:  Recent Labs  02/09/13 2155 02/10/13 0516  WBC 11.7* 10.3  HGB 10.8* 10.1*  HCT 33.8* 29.4*  PLT 249 228   BMET  Recent Labs  02/09/13 2155 02/10/13 0516 02/11/13 0550  NA 134* 138 143  K 4.9 4.5 3.8  CL 97 104 113*  CO2 20 18* 16*  GLUCOSE 136* 99 78  BUN 37* 33* 18  CREATININE 1.41* 1.19* 0.92  CALCIUM 10.1 8.8 8.3*     Studies/Results: Dg Thoracic Spine 2 View  02/09/2013   CLINICAL DATA:  Back pain post fall today  EXAM: THORACIC SPINE - 2 VIEW  COMPARISON:  Chest radiographs 220 T6 2014  FINDINGS: Twelve pairs of ribs.  Diffuse osseous demineralization.  Dextro convex thoracolumbar scoliosis apex T11/T12.  Marked compression deformity of the inferior endplate of T9 demonstrating approximately 50% anterior height loss, new since previous exam.  No additional fracture or dislocation identified.  Calcified AP window and left hilar adenopathy.  Extensive atherosclerotic calcification aorta.  Biapical pleural parenchymal lung scarring unchanged.  IMPRESSION: Osseous demineralization with new inferior endplate compression fracture of T9 vertebral body since 04/14/2012, demonstrating approximately 50% anterior height loss.   Electronically Signed   By: Ulyses Southward M.D.   On: 02/09/2013 16:41   Dg Lumbar Spine Complete  02/09/2013   CLINICAL DATA:  Back pain post fall today  EXAM: LUMBAR SPINE - COMPLETE 4+ VIEW  COMPARISON:  03/07/2005  FINDINGS: Five non-rib-bearing lumbar vertebrae.  Levoconvex lumbar scoliosis apex L2.  Scattered facet degenerative changes.  Height loss of the T9 vertebral body as reported on thoracic radiographs.  Disc space narrowing with endplate spur formation at L1-L2 and L2-L3.  No lumbar  fracture or subluxation.  No spondylolysis.  Extensive atherosclerotic calcification of the aorta, iliac arteries and abdominal branch vessels.  SI joints symmetric.  IMPRESSION: Osseous demineralization with degenerative disc and facet disease changes of the lumbar spine with associated levoconvex lumbar scoliosis.  No acute abnormalities.   Electronically Signed   By: Loraine Leriche  Tyron Russell M.D.   On: 02/09/2013 16:43    Assessment; #1. Solid food dysphagia. She has history of Schatzki's ring. She underwent esophageal dilation initially in January 2011 and more recently in February this year. She is not having difficulty with clear liquids. Bed side evaluation by Ms. Sid Falcon, SLP is within normal limits. She will be further evaluated with barium pill study before another EGD/ED considered. #2. Chronic diarrhea most likely secondary to IBS. She had colonoscopy 6 weeks ago. There was no evidence of endoscopic or microscopic colitis. She actually has been having normal stools with 2 mg of loperamide and Metamucil. Recent bout of diarrhea may be food borne illness or her not taking loperamide. #3. Anemia. She has chronic anemia. Recent iron studies were negative. She has had normal B12 and folate levels in September 2012. Will repeat B12 level. #4. Weakness. She has had this symptom for several months. It is impacting quality of her life. She has had normal CPK. She appears to be an appropriate dose of levothyroxine. She does have elevated sedimentation rate. She does not have typical symptoms of PMR. Need to assess LV function. Finally her weakness may be secondary to Zoloft.  Recommendations; Barium Pill Esophagogram today. Fasting cortisol level in a.m. Serum B12 level. Consider echocardiography for LV function.   LOS: 2 days   REHMAN,NAJEEB U  02/11/2013, 10:20 AM

## 2013-02-12 ENCOUNTER — Encounter (HOSPITAL_COMMUNITY)
Admission: EM | Disposition: A | Discharge status: Skilled Nursing Facility | Payer: Self-pay | Source: Home / Self Care | Attending: Internal Medicine

## 2013-02-12 DIAGNOSIS — K225 Diverticulum of esophagus, acquired: Secondary | ICD-10-CM

## 2013-02-12 DIAGNOSIS — K31811 Angiodysplasia of stomach and duodenum with bleeding: Secondary | ICD-10-CM

## 2013-02-12 DIAGNOSIS — K222 Esophageal obstruction: Secondary | ICD-10-CM

## 2013-02-12 DIAGNOSIS — K449 Diaphragmatic hernia without obstruction or gangrene: Secondary | ICD-10-CM

## 2013-02-12 HISTORY — PX: ESOPHAGOGASTRODUODENOSCOPY (EGD) WITH ESOPHAGEAL DILATION: SHX5812

## 2013-02-12 LAB — CORTISOL: Cortisol, Plasma: 4.4 ug/dL

## 2013-02-12 SURGERY — ESOPHAGOGASTRODUODENOSCOPY (EGD) WITH ESOPHAGEAL DILATION
Anesthesia: Moderate Sedation

## 2013-02-12 MED ORDER — MEPERIDINE HCL 50 MG/ML IJ SOLN
INTRAMUSCULAR | Status: AC
  Filled 2013-02-12: qty 1

## 2013-02-12 MED ORDER — BUTAMBEN-TETRACAINE-BENZOCAINE 2-2-14 % EX AERO
INHALATION_SPRAY | CUTANEOUS | Status: DC | PRN
  Administered 2013-02-12: 2 via TOPICAL

## 2013-02-12 MED ORDER — MIDAZOLAM HCL 5 MG/5ML IJ SOLN
INTRAMUSCULAR | Status: DC | PRN
  Administered 2013-02-12: 2 mg via INTRAVENOUS
  Administered 2013-02-12: 1 mg via INTRAVENOUS

## 2013-02-12 MED ORDER — MEPERIDINE HCL 25 MG/ML IJ SOLN
INTRAMUSCULAR | Status: DC | PRN
  Administered 2013-02-12: 20 mg via INTRAVENOUS

## 2013-02-12 MED ORDER — STERILE WATER FOR IRRIGATION IR SOLN
Status: DC | PRN
  Administered 2013-02-12: 09:00:00

## 2013-02-12 MED ORDER — SUCRALFATE 1 GM/10ML PO SUSP
1.0000 g | Freq: Three times a day (TID) | ORAL | Status: DC
  Administered 2013-02-12 – 2013-02-15 (×11): 1 g via ORAL
  Filled 2013-02-12 (×11): qty 10

## 2013-02-12 MED ORDER — PREDNISONE 10 MG PO TABS
10.0000 mg | ORAL_TABLET | Freq: Every day | ORAL | Status: DC
  Administered 2013-02-13: 10 mg via ORAL
  Filled 2013-02-12: qty 1

## 2013-02-12 MED ORDER — MIDAZOLAM HCL 5 MG/5ML IJ SOLN
INTRAMUSCULAR | Status: AC
  Filled 2013-02-12: qty 10

## 2013-02-12 MED ORDER — HYDRALAZINE HCL 20 MG/ML IJ SOLN
10.0000 mg | Freq: Four times a day (QID) | INTRAMUSCULAR | Status: DC | PRN
  Administered 2013-02-13: 10 mg via INTRAVENOUS
  Filled 2013-02-12 (×2): qty 1

## 2013-02-12 NOTE — Op Note (Addendum)
EGD PROCEDURE REPORT  PATIENT:  Kaitlyn Cooper  MR#:  [id number] Birthdate:  1926-10-09, 79 y.o., female Endoscopist:  Dr. Malissa Hippo, MD Referred By:  Dr. Susa Raring, MD Procedure Date: 02/12/2013  Procedure:   EGD with therapeutic interventions.  Indications:  Patient is a 79 year old Caucasian female who has solid food dysphagia. She has history of Schatzki's ring which was dilated initially 4 years ago and more recently in February 2014. She had barium pill study yesterday which revealed tertiary contractions small Zenker's diverticulum as well as distal esophageal diverticula and narrowing proximal to GE junction not allowing passage of barium pill. She is therefore undergoing EGD with ED.            Informed Consent:  The risks, benefits, alternatives & imponderables which include, but are not limited to, bleeding, infection, perforation, drug reaction and potential missed lesion have been reviewed.  The potential for biopsy, lesion removal, esophageal dilation, etc. have also been discussed.  Questions have been answered.  All parties agreeable.  Please see history & physical in medical record for more information.  Medications:  Demerol 20 mg IV Versed 3 mg IV Cetacaine spray topically for oropharyngeal anesthesia  Description of procedure:  The endoscope was introduced through the mouth and advanced to the second portion of the duodenum without difficulty or limitations. The mucosal surfaces were surveyed very carefully during advancement of the scope and upon withdrawal.  Findings:  Esophagus:  Mucosa of the proximal segment was normal. 3 diverticula noted involving the distal esophagus. Narrow segment noted 5 cm proximal to GE junction. Resistance noted as the scope was passed across it but mucosa was normal. None critical ring noted at GE junction. GEJ:  34 cm Hiatus:  39 cm Stomach:  Fresh blood noted in proximal stomach trace to a small AV malformation which was oozing  or bleeding. This AVM was located just below the level of hiatus. Mucosa of the rest of the stomach was normal. Pyloric channel was patent. Angularis was unremarkable. Hernia was easily seen on retroflex view along with bleeding AV malformation. Duodenum:  Normal bulbar and post bulbar mucosa.  Therapeutic/Diagnostic Maneuvers Performed:   Gastric AV malformation was treated with argon plasma coagulator and bleeding controlled. Distal esophageal stricture was dilated using balloon dilator. The balloon dilator was passed with the scope. The guidewire was pushed into the gastric lumen. The balloon dilator was positioned across the stricture by withdrawing the scope into body of esophagus. Balloon dilator was initially insufflated to dilator a 15 mm and passed distally. No mucosal destruction noted. Balloon was repositioned and insufflated to dilator of 16.5 mm and maintained for a few seconds and passed distally. Linear mucosal destruction noted at the level of narrowing. Balloon was deflated and withdrawn.   Complications:  None  Impression: Small distal is a facial diverticula. Esophageal stricture/spastic segment noted 5 cm proximal to GE junction. It was dilated to 16.5 mm resulting in mucosal disruption. Noncritical ring at GE junction. Moderate-sized sliding type hernia. Gastric AV malformation with active bleeding/oozing controlled with argon plasma coagulation.  Recommendations:  Discontinue ibuprofen. Sucralfate suspension 1 g by mouth a.c. and each bedtime. Full liquids today.  REHMAN,NAJEEB U  02/12/2013  9:48 AM  CC: Dr. [name], MD & Dr. Bonnetta Barry ref. provider found

## 2013-02-12 NOTE — Progress Notes (Signed)
[facility]                                                                                Patient Demographics  Kaitlyn Cooper, is a 79 y.o. female, DOB - 13-Jan-1927, ZOX:[id number]  Admit date - 02/09/2013   Admitting Physician Vania Rea, MD  Outpatient Primary MD for the patient is LUKING,SCOTT, MD  LOS - 3   Chief Complaint  Patient presents with  . Fatigue      Brief summary  79 year old frail Caucasian female with known history of chronic diarrhea, esophageal  Stricture, hypothyroidism, progressive generalized weakness, he was at home to the hospital with progressive generalized weakness and a new fall, she was admitted with a running diagnosis of acute on chronic diarrhea causing some dehydration and orthostatic hypotension. She also had evidence of UTI. A random ESR was checked was 75.  She has been seen by GI placed on Imodium with good effect, with IV fluids and improvement of her diarrhea her orthostasis is improved, she's been seen by GI and is due for EGD on 02/12/2013 based on her barium swallow results, her borderline high ESR is suspicious for polymyalgia rheumatica, she received one dose of IV steroid and thereafter I will place her on low-dose oral steroids.     Assessment & Plan    1.Acute on chronic diarrhea. Now much better no bowel movements in the last 24 hours. She has been resumed on home dose Imodium which she takes at home, gentle IV fluids continued overall feels much better.     2. Dehydration-acute renal failure-orthostatic hypotension - entry to diarrhea, all resolved with IV fluids, continue gentle hydration and monitor. Blood pressures are much better. Not orthostatic anymore.     3. Falls secondary to #1 and 2 above. She has some T-spine back pain and suggestion of T6 compression fracture on x-ray, minimal tenderness overlying T-spine, discussed her case and CT results with neurosurgeon on call Dr. Danielle Dess. Per neurosurgeon outpatient  followup in his office in 3-4 weeks' time, We'll increase activity, PT evaluation.      4. Some chronic dysphagia. History of Es.stricture. GI reconsulted. Barium swallow suggestive of diverticulum and stricture, also a possible stuck pill in the esophagus, she's going for EGD by GI later today.     5.UTI - follow cultures stable on Levaquin which will be continued     6. H/O depression stable on Zoloft.     7. History of tachybradycardia syndrome status post St. Jude pacemaker. Stable no acute issues on telemetry.     8.Hypothyroidism - TSH low on admission, reduced synthroid   Lab Results  Component Value Date   TSH 0.398 02/11/2013     9. Gradually progressive generalized weakness with a random ESR of 75 - question if this could be polymyalgia rheumatica, will place her on low-dose prednisone 10 mg daily with outpatient followup with rheumatology, this is important that she follows with a rheumatologist before being subjected to long-term steroids.     Code Status: Full  Family Communication:  sister bedside  Disposition Plan: Likely home with home health and physical therapy sister and patient would like to be discharged either  on the 28th or 29th   Procedures  CT T-spine, barium swallow, EGD scheduled for later today   Consults  neurosurgery Dr. Danielle Dess over the phone called   Medications  Scheduled Meds: . famotidine  20 mg Oral BID  . feeding supplement (ENSURE COMPLETE)  237 mL Oral BID BM  . heparin subcutaneous  5,000 Units Subcutaneous Q8H  . ibuprofen  400 mg Oral Q6H  . levofloxacin (LEVAQUIN) IV  250 mg Intravenous Q24H  . levothyroxine  50 mcg Oral QAC breakfast  . loperamide  2 mg Oral BID  . meperidine      . midazolam      . predniSONE  20 mg Oral Q breakfast  . psyllium  1 packet Oral QHS,MR X 1  . sertraline  50 mg Oral q morning - 10a   Continuous Infusions: . sodium chloride 35 mL/hr (02/11/13 0802)  . sodium chloride 20 mL/hr  at 02/11/13 2252   PRN Meds:.acetaminophen, ondansetron (ZOFRAN) IV  DVT Prophylaxis    Heparin   Lab Results  Component Value Date   PLT 228 02/10/2013    Antibiotics    Anti-infectives   Start     Dose/Rate Route Frequency Ordered Stop   02/10/13 1200  Levofloxacin (LEVAQUIN) IVPB 250 mg     250 mg 50 mL/hr over 60 Minutes Intravenous Every 24 hours 02/10/13 1052            Subjective:   Pincus Sanes today has, No headache, No chest pain, No abdominal pain - No Nausea, No new weakness tingling or numbness, No Cough - SOB. Mild back pain and some generalized weakness but feels better than before.  Objective:   Filed Vitals:   02/12/13 0512 02/12/13 0621 02/12/13 0622 02/12/13 0624  BP:  173/66 179/68 132/69  Pulse:  65 75 85  Temp:  97.8 F (36.6 C)    TempSrc:  Oral    Resp:  20 20 20   Height:      Weight: 50.2 kg (110 lb 10.7 oz)     SpO2:  93% 92% 92%    Wt Readings from Last 3 Encounters:  02/12/13 50.2 kg (110 lb 10.7 oz)  02/12/13 50.2 kg (110 lb 10.7 oz)  02/03/13 46.766 kg (103 lb 1.6 oz)     Intake/Output Summary (Last 24 hours) at 02/12/13 0857 Last data filed at 02/12/13 0600  Gross per 24 hour  Intake   1378 ml  Output      0 ml  Net   1378 ml    Exam Awake Alert, Oriented X 3, No new F.N deficits, Normal affect [place].AT,PERRAL Supple Neck,No JVD, No cervical lymphadenopathy appriciated.  Symmetrical Chest wall movement, Good air movement bilaterally, CTAB RRR,No Gallops,Rubs or new Murmurs, No Parasternal Heave +ve B.Sounds, Abd Soft, Non tender, No organomegaly appriciated, No rebound - guarding or rigidity. No Cyanosis, Clubbing or edema, No new Rash or bruise  , mild T spine tenderness   Data Review   Micro Results Recent Results (from the past 240 hour(s))  URINE CULTURE     Status: None   Collection Time    02/10/13  7:00 AM      Result Value Range Status   Specimen Description URINE, CLEAN CATCH   Final   Special Requests  NONE   Final   Culture  Setup Time     Final   Value: 02/10/2013 23:49     Performed at [facility]  Count PENDING   Incomplete   Culture     Final   Value: Culture reincubated for better growth     Performed at [facility]   Report Status PENDING   Incomplete  SURGICAL PCR SCREEN     Status: None   Collection Time    02/12/13  4:46 AM      Result Value Range Status   MRSA, PCR NEGATIVE  NEGATIVE Final   Staphylococcus aureus NEGATIVE  NEGATIVE Final   Comment:            The Xpert SA Assay (FDA     approved for NASAL specimens     in patients over 8 years of age),     is one component of     a comprehensive surveillance     program.  Test performance has     been validated by [employer] for patients greater     than or equal to 43 year old.     It is not intended     to diagnose infection nor to     guide or monitor treatment.    Radiology Reports Dg Thoracic Spine 2 View  02/09/2013   CLINICAL DATA:  Back pain post fall today  EXAM: THORACIC SPINE - 2 VIEW  COMPARISON:  Chest radiographs 220 T6 2014  FINDINGS: Twelve pairs of ribs.  Diffuse osseous demineralization.  Dextro convex thoracolumbar scoliosis apex T11/T12.  Marked compression deformity of the inferior endplate of T9 demonstrating approximately 50% anterior height loss, new since previous exam.  No additional fracture or dislocation identified.  Calcified AP window and left hilar adenopathy.  Extensive atherosclerotic calcification aorta.  Biapical pleural parenchymal lung scarring unchanged.  IMPRESSION: Osseous demineralization with new inferior endplate compression fracture of T9 vertebral body since 04/14/2012, demonstrating approximately 50% anterior height loss.   Electronically Signed   By: Ulyses Southward M.D.   On: 02/09/2013 16:41   Dg Lumbar Spine Complete  02/09/2013   CLINICAL DATA:  Back pain post fall today  EXAM: LUMBAR SPINE - COMPLETE 4+ VIEW  COMPARISON:  03/07/2005   FINDINGS: Five non-rib-bearing lumbar vertebrae.  Levoconvex lumbar scoliosis apex L2.  Scattered facet degenerative changes.  Height loss of the T9 vertebral body as reported on thoracic radiographs.  Disc space narrowing with endplate spur formation at L1-L2 and L2-L3.  No lumbar fracture or subluxation.  No spondylolysis.  Extensive atherosclerotic calcification of the aorta, iliac arteries and abdominal branch vessels.  SI joints symmetric.  IMPRESSION: Osseous demineralization with degenerative disc and facet disease changes of the lumbar spine with associated levoconvex lumbar scoliosis.  No acute abnormalities.   Electronically Signed   By: Ulyses Southward M.D.   On: 02/09/2013 16:43    CBC  Recent Labs Lab 02/09/13 2155 02/10/13 0516  WBC 11.7* 10.3  HGB 10.8* 10.1*  HCT 33.8* 29.4*  PLT 249 228  MCV 93.6 93.3  MCH 29.9 32.1  MCHC 32.0 34.4  RDW 13.7 13.8  LYMPHSABS 1.4  --   MONOABS 0.9  --   EOSABS 0.0  --   BASOSABS 0.0  --     Chemistries   Recent Labs Lab 02/09/13 2155 02/10/13 0516 02/11/13 0550  NA 134* 138 143  K 4.9 4.5 3.8  CL 97 104 113*  CO2 20 18* 16*  GLUCOSE 136* 99 78  BUN 37* 33* 18  CREATININE 1.41* 1.19* 0.92  CALCIUM 10.1 8.8 8.3*   ------------------------------------------------------------------------------------------------------------------  estimated creatinine clearance is 36.1 ml/min (by C-G formula based on Cr of 0.92). ------------------------------------------------------------------------------------------------------------------  Recent Labs  02/10/13 0516  HGBA1C 5.9*   ------------------------------------------------------------------------------------------------------------------ No results found for this basename: CHOL, HDL, LDLCALC, TRIG, CHOLHDL, LDLDIRECT,  in the last 72 hours ------------------------------------------------------------------------------------------------------------------  Recent Labs  02/11/13 1104   TSH 0.398  T4TOTAL 6.8  T3FREE 2.0*   ------------------------------------------------------------------------------------------------------------------ No results found for this basename: VITAMINB12, FOLATE, FERRITIN, TIBC, IRON, RETICCTPCT,  in the last 72 hours  Coagulation profile No results found for this basename: INR, PROTIME,  in the last 168 hours  No results found for this basename: DDIMER,  in the last 72 hours  Cardiac Enzymes No results found for this basename: CK, CKMB, TROPONINI, MYOGLOBIN,  in the last 168 hours ------------------------------------------------------------------------------------------------------------------ No components found with this basename: POCBNP,      Time Spent in minutes   35   Naveen Lorusso K M.D on 02/12/2013 at 8:57 AM  Between 7am to 7pm - Pager - [phone]  After 7pm go to [email or web address] - password [email or web address]  And look for the night coverage person covering for me after hours  [facility] Office  [phone]

## 2013-02-12 NOTE — Progress Notes (Signed)
Patient c/o a "bump, bump, bump" in her chest. Denies chest pain. Elevated B/P noted of 196/67. Contacted midlevel hospitalist on call. Orders received. Will continue to monitor.

## 2013-02-12 NOTE — Progress Notes (Signed)
When this nurse explained to the patient that the MD ordered meds to bring her blood pressure down, she asked that we recheck her B/P. Rechecked B/P was 178/51. I explained to her that even though the blood pressure had come down some it was still an abnormal blood pressure. Patient states that she would "rather not take any blood pressure medicine right now". Will continue to monitor and reassess need for PRN hydralazine.

## 2013-02-13 LAB — URINE CULTURE: Colony Count: 45000

## 2013-02-13 LAB — BASIC METABOLIC PANEL
Calcium: 8.5 mg/dL (ref 8.4–10.5)
Creatinine, Ser: 0.79 mg/dL (ref 0.50–1.10)
GFR calc Af Amer: 86 mL/min — ABNORMAL LOW (ref >90)

## 2013-02-13 MED ORDER — ACETAMINOPHEN-CODEINE #3 300-30 MG PO TABS
1.0000 | ORAL_TABLET | Freq: Four times a day (QID) | ORAL | Status: DC | PRN
  Administered 2013-02-13: 2 via ORAL
  Filled 2013-02-13: qty 2

## 2013-02-13 MED ORDER — ISOSORBIDE MONONITRATE 10 MG PO TABS
5.0000 mg | ORAL_TABLET | Freq: Two times a day (BID) | ORAL | Status: DC
  Administered 2013-02-13 – 2013-02-14 (×3): 5 mg via ORAL
  Filled 2013-02-13 (×7): qty 0.5

## 2013-02-13 MED ORDER — HYDROCODONE-ACETAMINOPHEN 5-325 MG PO TABS
1.0000 | ORAL_TABLET | ORAL | Status: DC | PRN
  Administered 2013-02-13 – 2013-02-15 (×11): 1 via ORAL
  Filled 2013-02-13 (×4): qty 1
  Filled 2013-02-13: qty 2
  Filled 2013-02-13 (×5): qty 1
  Filled 2013-02-13: qty 2
  Filled 2013-02-13: qty 1

## 2013-02-13 NOTE — Progress Notes (Signed)
IV removed from patient due to leaking. Nine unsuccessful attempts by two floor RN's, the [facility], and one ED RN. notified MD and he stated that it would be ok to leave out at this time. Will continue to monitor patient.

## 2013-02-13 NOTE — Progress Notes (Signed)
Subjective; Patient states she had bad nights. She complains of upper back pain not relieved with Tylenol. She denies nausea vomiting and having no difficulty swallowing full liquids. She denies abdominal pain. She hasn't had a bowel movement in 4 days but does not feel she is constipated. She also complains of pain at IV site.  Objective; BP 161/42  Pulse 74  Temp(Src) 97.4 F (36.3 C) (Oral)  Resp 16  Ht 5' 3.5" (1.613 m)  Wt 109 lb 5.6 oz (49.6 kg)  BMI 19.06 kg/m2  SpO2 95% Patient is alert and in no acute distress but appears upset. Cardiac exam with a regular rhythm normal S1 and S2. No murmur or gallop noted. Lungs are clear to auscultation. Abdomen is flat, soft and nontender. Extremities are thin but no edema noted. No erythema noted at IV site.  Lab data; Vitamin B12 level is 1287. Fasting cortisol level is 4.4 which is low normal. Serum sodium 137, potassium 3.2, chloride 105, CO2 16, BUN 14, creatinine 0.79 and glucose 101.  Assessment; #1. Dysphagia secondary to esophageal stricture/spastic segment. Status post dilation yesterday. She is having no difficulty with full liquids. She may benefit from nitrates which will be tried at a low dose. #2. Weakness. Doubt that her weakness is secondary to chronic anemia. Suspect as Zoloft may be contribution to her weakness. She did have active bleeding from a gastric AV malformation controlled with argon plasma coagulation. #3. Upper back pain secondary to T9 vertebral body fracture. #4. Mild hypokalemia being addressed by Dr. Mahala Menghini.  Recommendations; Advance diet to mechanical soft diet. Ismo 5 mg by mouth twice a day. No NSAIDs.

## 2013-02-13 NOTE — Progress Notes (Addendum)
Kaitlyn Cooper HQI:[id number] DOB: 1927-02-04 DOA: 02/09/2013 PCP: [name], MD  Brief narrative:  79 y/o ?, known historybradycardia syndrome status post PPM 01/17/09,  Chronic orthostasis noted by a cardiologist dating back at least 01/2009 when her pacemaker was placed [ recent interrogation 12/2012 was normal]history of temporal skin cancer 2004, cardiac cath 1998  Known chronic diarrhea~ 8 times per day, chronic normocytic anemia, B12 deficiency with an elevated ESR [ used to be on prednisone under the direction of Dr. Mariel Sleet- but this was discontinued as it was felt that this was not really an entertainer but diagnosis],  Prior history 2 adenoma in 1995, no polyps 2004 [ colonoscopy early November 2014~? IBS], known Schatzki ring,  Admitted 02/10/13 with history of sudden acute syncopal event in a setting of 2-3 weeks of chronic weakness.   Found to have T9 thoracic fracture initially on emergency room visit and subsequently admitted later that day  Past medical history-As per Problem list Chart reviewed as below-  as above  Consultants:   none  Procedures:   none  Antibiotics:    Levaquin 12/25-12/28   Subjective   doing fair. Tolerating clear liquids No stool as yet. The chest pain no shortness breath Needed Percocet because Tylenol No. 3 did not help the back pain. Family in room oriented x3   Objective    Interim History:  none  Telemetry:  paced rhythm, occasional PVC   Objective: Filed Vitals:   02/13/13 0539 02/13/13 0548 02/13/13 0700 02/13/13 1501  BP:  186/68 161/42 168/50  Pulse:  72 74 76  Temp:  97.4 F (36.3 C)  97.7 F (36.5 C)  TempSrc:  Oral  Oral  Resp:  16  18  Height:      Weight: 49.6 kg (109 lb 5.6 oz)     SpO2:  95%  95%    Intake/Output Summary (Last 24 hours) at 02/13/13 1603 Last data filed at 02/13/13 1242  Gross per 24 hour  Intake 1232.42 ml  Output   1050 ml  Net 182.42 ml    Exam:  General: Body mass index  is 19.06 kg/(m^2). Cardiovascular: S1-S2 regular rhythm no murmur Respiratory:  clear Abdomen:  No added Skin no lower extremity edema Neuro grossly intact  Data Reviewed: Basic Metabolic Panel:  Recent Labs Lab 02/09/13 2155 02/10/13 0516 02/11/13 0550 02/13/13 0730  NA 134* 138 143 137  K 4.9 4.5 3.8 3.2*  CL 97 104 113* 105  CO2 20 18* 16* 16*  GLUCOSE 136* 99 78 101*  BUN 37* 33* 18 14  CREATININE 1.41* 1.19* 0.92 0.79  CALCIUM 10.1 8.8 8.3* 8.5   Liver Function Tests: No results found for this basename: AST, ALT, ALKPHOS, BILITOT, PROT, ALBUMIN,  in the last 168 hours No results found for this basename: LIPASE, AMYLASE,  in the last 168 hours No results found for this basename: AMMONIA,  in the last 168 hours CBC:  Recent Labs Lab 02/09/13 2155 02/10/13 0516  WBC 11.7* 10.3  NEUTROABS 9.3*  --   HGB 10.8* 10.1*  HCT 33.8* 29.4*  MCV 93.6 93.3  PLT 249 228   Cardiac Enzymes: No results found for this basename: CKTOTAL, CKMB, CKMBINDEX, TROPONINI,  in the last 168 hours BNP: No components found with this basename: POCBNP,  CBG: No results found for this basename: GLUCAP,  in the last 168 hours  Recent Results (from the past 240 hour(s))  URINE CULTURE     Status: None  Collection Time    02/10/13  7:00 AM      Result Value Range Status   Specimen Description URINE, CLEAN CATCH   Final   Special Requests NONE   Final   Culture  Setup Time     Final   Value: 02/10/2013 23:49     Performed at [facility] Count     Final   Value: 45,000 COLONIES/ML     Performed at [facility]   Culture     Final   Value: ESCHERICHIA COLI     Performed at [facility]   Report Status 02/13/2013 FINAL   Final   Organism ID, Bacteria ESCHERICHIA COLI   Final  SURGICAL PCR SCREEN     Status: None   Collection Time    02/12/13  4:46 AM      Result Value Range Status   MRSA, PCR NEGATIVE  NEGATIVE Final   Staphylococcus aureus  NEGATIVE  NEGATIVE Final   Comment:            The Xpert SA Assay (FDA     approved for NASAL specimens     in patients over 68 years of age),     is one component of     a comprehensive surveillance     program.  Test performance has     been validated by [employer] for patients greater     than or equal to 31 year old.     It is not intended     to diagnose infection nor to     guide or monitor treatment.     Studies:              All Imaging reviewed and is as per above notation   Scheduled Meds: . famotidine  20 mg Oral BID  . feeding supplement (ENSURE COMPLETE)  237 mL Oral BID BM  . heparin subcutaneous  5,000 Units Subcutaneous Q8H  . isosorbide mononitrate  5 mg Oral BID  . levothyroxine  50 mcg Oral QAC breakfast  . loperamide  2 mg Oral BID  . sertraline  50 mg Oral q morning - 10a  . sucralfate  1 g Oral TID WC & HS   Continuous Infusions: . sodium chloride 35 mL/hr (02/11/13 0802)     Assessment/Plan: 1.Acute on chronic diarrhea in a setting of IBS. no bowel movements in the last 24 hours. esumed on home dose Imodium which she takes at home, gentle IV fluids continued overall feels much better-this is a chronic problem 2.  Multifactorial volume depletion secondary to diarrhea +orthostatic hypotension -  Blood pressures actually are high now-cycle orthostatic vital signs, ambulate with rolling walker.   Potentially may need skilled nursing care? 3. Falls secondary to #1 and 2 above. She has some T-spine back pain and suggestion of T6 compression fracture on x-ray, minimal tenderness overlying T-spine,   Dr. Thedore Mins  discussed CT results with neurosurgeon on call Dr. Danielle Dess. Per neurosurgeon outpatient followup in his office in 3-4 weeks' time,  4. Some chronic dysphagia. History of Es.stricture. GI reconsulted. 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 Unlikely pyelonephritis- Levaquin discontinued, Escherichia coli 45,000 colony-forming units only 6. H/O depression stable on Zoloft- will defer to primary care physician regarding weaning  this off 7. History of tachybradycardia syndrome status post St. Jude pacemaker. Stable no acute issues on telemetry.  8.Hypothyroidism - TSH low on admission, reduced synthroid.   Unclear how to interpret free T3 levels when patient is acutely ill. Recommend repetition and reevaluation of TSH this free T4 in about 3 weeks 9.  Chronic microcytic anemia with elevated ESR dating back as far as 2010- unlikely patient has polymyalgia rheumatica-discontinue by mouth steroids given risk for GI bleed 10.  Mild metabolic acidosis-bicarbonate has dropped from 20 on admission to 16-this is probably secondary to episodes of diarrhea. Monitor   Code Status:  full Family Communication:  Discussed in detail with daughter and niece at bedside-she has good family support and niece lives next door and checks in on her Disposition Plan:  Likely home with therapy provided patient is safe ambulating   >35 minutes   Pleas Koch, MD  [facility] Pager [phone] 02/13/2013, 4:03 PM    LOS: 4 days

## 2013-02-13 NOTE — Progress Notes (Signed)
Per MD order performed orthostatic BP check on patient. Patient was getting up from the bed after sitting there for a few minutes to regain her orientation. Patient stepped to the bedside commode where she then started to dry heave. Patient did not vomit. Patient stated that she did not feel sick prior to getting on the bed side commode but she feels like it could have something to do with her not eating very much and taking her pain medication. Patients BP while sitting was 165/69 when patient stood up her BP was 122/62. Patient also refused to walk at that time. Explained to the patient the importance of walking and getting out of the bed. MD is aware at this time. Will continue to monitor the patient.

## 2013-02-14 ENCOUNTER — Encounter (HOSPITAL_COMMUNITY): Payer: Self-pay | Admitting: Internal Medicine

## 2013-02-14 ENCOUNTER — Inpatient Hospital Stay (HOSPITAL_COMMUNITY): Payer: Medicare Other

## 2013-02-14 NOTE — Progress Notes (Signed)
Subjective; Patient states she is having less back pain today. However she had difficulty swallowing eggs at breakfast. She denies abdominal pain or melena.  Objective; BP 116/67  Pulse 78  Temp(Src) 97.8 F (36.6 C) (Oral)  Resp 18  Ht 5' 3.5" (1.613 m)  Wt 104 lb 15 oz (47.6 kg)  BMI 18.30 kg/m2  SpO2 91% Patient is alert and appears to be comfortable. She took a few sips of water and did not regurgitate. She thought water went down slowly.  Assessment; #1. Dysphagia. From the process appears to be esophageal motility disorder. She had narrow/spastic segment 5 cm proximal to GE junction corresponding to abnormality on BPE. Segment was dilated to 16.5 mm 2 days ago. He was able to swallow full liquids yesterday. She is on low-dose Ismo. If dysphagia persists could increase Ismo dose to 10 mg twice a day. #2. Weakness. Acute on chronic problem having impact on quality of her life. Discussed with Dr. Mahala Menghini. He agrees with the drop in Zoloft dose to 50 mg daily. He is also wondering if she has orthostatic hypotension. #3. Back pain secondary to T9 vertebral body fracture.  Recommendations; Patient advised to eat slowly and she should be upright when doing so.  Dr. Darrick Penna and associates will be seen patient starting on 02/15/2013.

## 2013-02-14 NOTE — Progress Notes (Signed)
Pt had an episode of emesis today unaccompanied by nausea. MD is aware and recommended that pt eat small meals and to either eat the food trays or the ensure but both. Pt was told and is agreeable. Pt was also able to ambulate down the hallway and back today. Pt had no SOB and no dyspnea. Will continue to monitor.

## 2013-02-14 NOTE — Clinical Social Work Placement (Signed)
Clinical Social Work Department CLINICAL SOCIAL WORK PLACEMENT NOTE 02/14/2013  Patient:  SOLEI, WUBBEN  Account Number:  [id number] Admit date:  02/09/2013  Clinical Social Worker:  Derenda Fennel, LCSW  Date/time:  02/14/2013 02:55 PM  Clinical Social Work is seeking post-discharge placement for this patient at the following level of care:   SKILLED NURSING   (*CSW will update this form in Epic as items are completed)   02/14/2013  Patient/family provided with [facility] Department of Clinical Social Work's list of facilities offering this level of care within the geographic area requested by the patient (or if unable, by the patient's family).  02/14/2013  Patient/family informed of their freedom to choose among providers that offer the needed level of care, that participate in Medicare, Medicaid or managed care program needed by the patient, have an available bed and are willing to accept the patient.  02/14/2013  Patient/family informed of [facility]' ownership interest in [facility], as well as of the fact that they are under no obligation to receive care at this facility.  PASARR submitted to EDS on 02/14/2013 PASARR number received from EDS on   FL2 transmitted to all facilities in geographic area requested by pt/family on  02/14/2013 FL2 transmitted to all facilities within larger geographic area on   Patient informed that his/her managed care company has contracts with or will negotiate with  certain facilities, including the following:     Patient/family informed of bed offers received:   Patient chooses bed at  Physician recommends and patient chooses bed at    Patient to be transferred to  on   Patient to be transferred to facility by   The following physician request were entered in Epic:   Additional Comments:  Derenda Fennel, LCSW [phone]

## 2013-02-14 NOTE — Progress Notes (Signed)
Pt called RN into room because her "head was spinning". The pt didn't report any nausea and her VS are stable at 164/55 70 94% on RA. Pt states "the spinning" has stopped. Pt will make sure to tell RN if episode happens again and promises to not get up without help. Pt's daughter is at bedside and the bed alarm is on. Will continue to monitor.

## 2013-02-14 NOTE — Progress Notes (Signed)
Kaitlyn Cooper ZOX:[id number] DOB: January 25, 1927 DOA: 02/09/2013 PCP: [name], MD  Brief narrative:  79 y/o ?, known historybradycardia syndrome status post PPM 01/17/09,  Chronic orthostasis noted by a cardiologist dating back at least 01/2009 when her pacemaker was placed [ recent interrogation 12/2012 was normal]history of temporal skin cancer 2004, cardiac cath 1998  Known chronic diarrhea~ 8 times per day, chronic normocytic anemia, B12 deficiency with an elevated ESR [ used to be on prednisone under the direction of Dr. Mariel Sleet- but this was discontinued as it was felt that this was not really an entertainer but diagnosis],  Prior history 2 adenoma in 1995, no polyps 2004 [ colonoscopy early November 2014~? IBS], known Schatzki ring,  Admitted 02/10/13 with history of sudden acute syncopal event in a setting of 2-3 weeks of chronic weakness.   Found to have T9 thoracic fracture initially on emergency room visit and subsequently admitted later that day  Past medical history-As per Problem list Chart reviewed as below-  as above  Consultants:   none  Procedures:   none  Antibiotics:    Levaquin 12/25-12/28   Subjective   Doing better.  A Couple of episodes of faint spells and nausea with hard diet No CP, N,V,SOb, LE edema Walked but felt a little tired after    Objective    Interim History:  none  Telemetry:  paced rhythm, occasional PVC   Objective: Filed Vitals:   02/14/13 0539 02/14/13 0543 02/14/13 0544 02/14/13 1257  BP: 171/64 129/67 116/67 164/55  Pulse: 78   70  Temp: 97.8 F (36.6 C)     TempSrc: Oral     Resp: 18   18  Height:      Weight: 47.6 kg (104 lb 15 oz)     SpO2: 91%   94%    Intake/Output Summary (Last 24 hours) at 02/14/13 1340 Last data filed at 02/14/13 0800  Gross per 24 hour  Intake 737.67 ml  Output    200 ml  Net 537.67 ml    Exam:  General: Body mass index is 18.3 kg/(m^2). Cardiovascular: S1-S2 regular rhythm no  murmur Respiratory:  clear Abdomen:  No added Skin no lower extremity edema Neuro grossly intact  Data Reviewed: Basic Metabolic Panel:  Recent Labs Lab 02/09/13 2155 02/10/13 0516 02/11/13 0550 02/13/13 0730  NA 134* 138 143 137  K 4.9 4.5 3.8 3.2*  CL 97 104 113* 105  CO2 20 18* 16* 16*  GLUCOSE 136* 99 78 101*  BUN 37* 33* 18 14  CREATININE 1.41* 1.19* 0.92 0.79  CALCIUM 10.1 8.8 8.3* 8.5   Liver Function Tests: No results found for this basename: AST, ALT, ALKPHOS, BILITOT, PROT, ALBUMIN,  in the last 168 hours No results found for this basename: LIPASE, AMYLASE,  in the last 168 hours No results found for this basename: AMMONIA,  in the last 168 hours CBC:  Recent Labs Lab 02/09/13 2155 02/10/13 0516  WBC 11.7* 10.3  NEUTROABS 9.3*  --   HGB 10.8* 10.1*  HCT 33.8* 29.4*  MCV 93.6 93.3  PLT 249 228   Cardiac Enzymes: No results found for this basename: CKTOTAL, CKMB, CKMBINDEX, TROPONINI,  in the last 168 hours BNP: No components found with this basename: POCBNP,  CBG: No results found for this basename: GLUCAP,  in the last 168 hours  Recent Results (from the past 240 hour(s))  URINE CULTURE     Status: None   Collection Time  02/10/13  7:00 AM      Result Value Range Status   Specimen Description URINE, CLEAN CATCH   Final   Special Requests NONE   Final   Culture  Setup Time     Final   Value: 02/10/2013 23:49     Performed at [facility] Count     Final   Value: 45,000 COLONIES/ML     Performed at [facility]   Culture     Final   Value: ESCHERICHIA COLI     Performed at [facility]   Report Status 02/13/2013 FINAL   Final   Organism ID, Bacteria ESCHERICHIA COLI   Final  SURGICAL PCR SCREEN     Status: None   Collection Time    02/12/13  4:46 AM      Result Value Range Status   MRSA, PCR NEGATIVE  NEGATIVE Final   Staphylococcus aureus NEGATIVE  NEGATIVE Final   Comment:            The Xpert SA  Assay (FDA     approved for NASAL specimens     in patients over 58 years of age),     is one component of     a comprehensive surveillance     program.  Test performance has     been validated by [employer] for patients greater     than or equal to 40 year old.     It is not intended     to diagnose infection nor to     guide or monitor treatment.     Studies:              All Imaging reviewed and is as per above notation   Scheduled Meds: . famotidine  20 mg Oral BID  . feeding supplement (ENSURE COMPLETE)  237 mL Oral BID BM  . heparin subcutaneous  5,000 Units Subcutaneous Q8H  . isosorbide mononitrate  5 mg Oral BID  . levothyroxine  50 mcg Oral QAC breakfast  . loperamide  2 mg Oral BID  . sertraline  50 mg Oral q morning - 10a  . sucralfate  1 g Oral TID WC & HS   Continuous Infusions:     Assessment/Plan:  1.Acute on chronic diarrhea in a setting of IBS-no bowel movements in the last 24 hours. Resumed on home dose Imodium which she takes at home, gentle IV fluids continued overall feels much better-this is a chronic problem, but I feel I should enlist the help of neurology both from a functional pov as well as to determine the best approach to her orthostasis from a medication standpoint. 2.  Multifactorial volume depletion secondary to diarrhea +orthostatic hypotension -  Blood pressures actually are high now-is orthostatic-stop checking.  Decrease dose of Isosorbide mononitrate  Potentially may need skilled nursing care? 3. Falls secondary to #1 and 2 above. She has some T-spine back pain and suggestion of T6 compression fracture on x-ray, minimal tenderness overlying T-spine,  Dr. Thedore Mins discussed CT results with neurosurgeon on call Dr. Danielle Dess.  Per neurosurgeon outpatient followup in his office in 3-4 weeks' time-Will get Lower back Lumbar films. 4. Some chronic dysphagia. History of Es.stricture. GI reconsulted. 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 5. Unlikely pyelonephritis- Levaquin discontinued,  Escherichia coli 45,000 colony-forming units only. 6. H/O depression stable on Zoloft- cut back dose to 50 mg qhs on 12/29. 7. History of tachybradycardia syndrome status post [employer] pacemaker. Stable no acute issues on telemetry.  8.Hypothyroidism - TSH low on admission, reduced synthroid.   Unclear how to interpret free T3 levels when patient is acutely ill. Recommend repetition and reevaluation of TSH this free T4 in about 3 weeks. 9.  Chronic microcytic anemia with elevated ESR dating back as far as 2010- unlikely patient has polymyalgia rheumatica-discontinue by mouth steroids given risk for GI bleed. 10.  Mild metabolic acidosis-bicarbonate has dropped from 20 on admission to 16-this is probably secondary to episodes of diarrhea. Monitor labs am.   Code Status:  full Family Communication:  Discussed in detail with daughter and niece at bedside-she has good family support and niece lives next door and checks in on her Disposition Plan:  Likely home with therapy provided patient is safe ambulating   >35 minutes   Pleas Koch, MD  [facility] Pager [phone] 02/14/2013, 1:40 PM    LOS: 5 days

## 2013-02-14 NOTE — Clinical Social Work Note (Addendum)
CSW met with pt and sister again as PT and MD feel pt needs SNF at d/c. CSW discussed this with pt. She is very reluctant, but agreeable to faxing out referral. She states, "Do what you need to do and I'll be fine with it." Pt's sister feels pt would benefit from SNF as well. CSW will fax out referral and then discuss with pt further tomorrow. Aware of Medicare coverage/criteria.   Derenda Fennel, [place] [phone]

## 2013-02-14 NOTE — Clinical Social Work Psychosocial (Signed)
Clinical Social Work Department BRIEF PSYCHOSOCIAL ASSESSMENT 02/14/2013  Patient:  Kaitlyn Cooper, Kaitlyn Cooper     Account Number:  [id number]     Admit date:  02/09/2013  Clinical Social Worker:  Nancie Neas  Date/Time:  02/14/2013 11:25 AM  Referred by:  Physician  Date Referred:  02/14/2013 Referred for  SNF Placement   Other Referral:   Interview type:  Patient Other interview type:   and sister- Kaitlyn Cooper    PSYCHOSOCIAL DATA Living Status:  ALONE Admitted from facility:   Level of care:   Primary support name:  Kaitlyn Cooper Primary support relationship to patient:  SIBLING Degree of support available:   supportive    CURRENT CONCERNS Current Concerns  Post-Acute Placement   Other Concerns:    SOCIAL WORK ASSESSMENT / PLAN CSW met with pt and pt's sister, Kaitlyn Cooper at bedside. Pt alert and oriented and reports she lives alone. Pt's sister appears to be involved and supportive. Pt ambulates without assistive device at baseline. CSW referred for possible placement. Discussed d/c plan with pt. She intends to return home with home health and her sister plans to stay with her until she regains strength. Discussed option of SNF, but pt is not interested. She ambulated in the hallway today with RN.   Assessment/plan status:  Referral to [facility] Other assessment/ plan:   Information/referral to community resources:   CM for home health    PATIENT'S/FAMILY'S RESPONSE TO PLAN OF CARE: Pt has no desire to go to SNF at d/c. CM aware of home health needs. CSW will sign off, but can be reconsulted if needed.       Derenda Fennel, [place] [phone]

## 2013-02-14 NOTE — Progress Notes (Signed)
Physical Therapy Treatment Patient Details Name: Kaitlyn Cooper MRN: [id number] DOB: 1927-01-15 Today's Date: 02/14/2013 Time: 1191-4782 PT Time Calculation (min): 21 min  PT Assessment / Plan / Recommendation  History of Present Illness Pt continues to c/o generalized weakness and occasional dizziness.  She is frustrated that no one has been able to fix her medical problems and she is tired of being so deconditioned.   PT Comments   Pt tolerated very minimal exercise while supine with no difficulty.  When she transferred to sitting, she developed dizziness immediately. It continued for several minutes, improving but never going away.  BP was taken and found to be 150/59.  She stated that she "just didn't feel right".  Radiology came at that point to take her for procedure.  She was able to ambulate 100' with a walker, no dizziness but c/o fatigue.  She leans quite heavily on the walker.  I am very concerned that with her frailty, advanced age and continued dizziness she will not be safe at home.  I am recommending that she go to SNF for a short time prior to going home.  Pt is reluctantly agreeable.  Follow Up Recommendations  SNF     Does the patient have the potential to tolerate intense rehabilitation     Barriers to Discharge        Equipment Recommendations       Recommendations for Other Services    Frequency     Progress towards PT Goals Progress towards PT goals: Progressing toward goals  Plan Discharge plan needs to be updated    Precautions / Restrictions     Pertinent Vitals/Pain     Mobility  Bed Mobility Bed Mobility: Supine to Sit Supine to Sit: 5: Supervision;HOB elevated Transfers Transfers: Stand to Sit Sit to Stand: 5: Supervision;With upper extremity assist Stand to Sit: 5: Supervision;With upper extremity assist Ambulation/Gait Ambulation/Gait Assistance: 4: Min assist Ambulation Distance (Feet): 100 Feet Assistive device: Rolling  walker Ambulation/Gait Assistance Details: pt normally ambulates with no assistive device. Gait velocity: slow General Gait Details: kyphotic posture Stairs: No    Exercises General Exercises - Lower Extremity Ankle Circles/Pumps: AROM;Both;10 reps;Supine Quad Sets: AROM;Both;10 reps;Supine Gluteal Sets: AROM;Both;10 reps;Supine   PT Diagnosis:    PT Problem List:   PT Treatment Interventions:     PT Goals (current goals can now be found in the care plan section)    Visit Information  Last PT Received On: 02/14/13 History of Present Illness: Pt continues to c/o generalized weakness and occasional dizziness.  She is frustrated that no one has been able to fix her medical problems and she is tired of being so deconditioned.    Subjective Data      Cognition       Balance     End of Session PT - End of Session Equipment Utilized During Treatment: Gait belt Activity Tolerance: Patient tolerated treatment well Patient left: in chair (had to be taken to x ray for procedure-tx cut short) Nurse Communication: Mobility status   GP     Konrad Penta 02/14/2013, 4:43 PM

## 2013-02-15 ENCOUNTER — Inpatient Hospital Stay
Admission: RE | Admit: 2013-02-15 | Discharge: 2013-03-14 | Disposition: A | Discharge status: Home / Self Care | Payer: Medicare Other | Source: Ambulatory Visit | Attending: Internal Medicine | Admitting: Internal Medicine

## 2013-02-15 DIAGNOSIS — R1310 Dysphagia, unspecified: Secondary | ICD-10-CM

## 2013-02-15 DIAGNOSIS — R059 Cough, unspecified: Secondary | ICD-10-CM

## 2013-02-15 DIAGNOSIS — R0902 Hypoxemia: Principal | ICD-10-CM

## 2013-02-15 DIAGNOSIS — R05 Cough: Secondary | ICD-10-CM

## 2013-02-15 LAB — RENAL FUNCTION PANEL
Calcium: 8.7 mg/dL (ref 8.4–10.5)
Creatinine, Ser: 0.74 mg/dL (ref 0.50–1.10)
GFR calc Af Amer: 88 mL/min — ABNORMAL LOW (ref >90)
GFR calc non Af Amer: 76 mL/min — ABNORMAL LOW (ref >90)
Glucose, Bld: 86 mg/dL (ref 70–99)
Phosphorus: 2.4 mg/dL (ref 2.3–4.6)
Sodium: 138 mEq/L (ref 137–147)

## 2013-02-15 LAB — CBC WITH DIFFERENTIAL/PLATELET
Basophils Absolute: 0 10*3/uL (ref 0.0–0.1)
Basophils Relative: 0 % (ref 0–1)
Eosinophils Absolute: 0.6 10*3/uL (ref 0.0–0.7)
HCT: 26.9 % — ABNORMAL LOW (ref 36.0–46.0)
MCH: 29.6 pg (ref 26.0–34.0)
MCHC: 32 g/dL (ref 30.0–36.0)
Monocytes Absolute: 1.6 10*3/uL — ABNORMAL HIGH (ref 0.1–1.0)
Neutro Abs: 6.4 10*3/uL (ref 1.7–7.7)
Neutrophils Relative %: 53 % (ref 43–77)
RDW: 14.2 % (ref 11.5–15.5)

## 2013-02-15 MED ORDER — SUCRALFATE 1 GM/10ML PO SUSP: 1.0000 g | Freq: Three times a day (TID) | ORAL | Status: DC

## 2013-02-15 MED ORDER — VITAMIN B-1 100 MG PO TABS
100.0000 mg | ORAL_TABLET | Freq: Every day | ORAL | Status: DC
  Administered 2013-02-15: 12:00:00 100 mg via ORAL
  Filled 2013-02-15: qty 1

## 2013-02-15 MED ORDER — ENSURE COMPLETE PO LIQD: 237.0000 mL | Freq: Two times a day (BID) | ORAL | Status: DC

## 2013-02-15 MED ORDER — HYDROCODONE-ACETAMINOPHEN 5-325 MG PO TABS: 1.0000 | ORAL_TABLET | ORAL | Status: DC | PRN

## 2013-02-15 MED ORDER — SERTRALINE HCL 50 MG PO TABS: 100.0000 mg | ORAL_TABLET | Freq: Every morning | ORAL | Status: DC

## 2013-02-15 NOTE — Progress Notes (Signed)
Physical Therapy Treatment Patient Details Name: Kaitlyn Cooper MRN: [id number] DOB: November 24, 1926 Today's Date: 02/15/2013 Time: 1034-1100 PT Time Calculation (min): 26 min Charges:  Therex, gait  PT Assessment / Plan / Recommendation     PT Comments   Pt states her back hurts no matter what position she's in due to her kyphosis.  Pt is agreeable to trying gait, exercises, however states she feels really weak today.  Pt able to ambulate 15 feet at which time she requested to return to chair.  Seated exercises completed without difficulty.        Precautions / Restrictions Precautions Precautions: None Restrictions Weight Bearing Restrictions: No       Mobility  Bed Mobility Bed Mobility: Supine to Sit Supine to Sit: 5: Supervision;HOB elevated Transfers Transfers: Stand to Sit Sit to Stand: 5: Supervision;With upper extremity assist Stand to Sit: 5: Supervision;With upper extremity assist Ambulation/Gait Ambulation/Gait Assistance: 4: Min assist Ambulation Distance (Feet): 15 Feet Assistive device: Rolling walker Gait Pattern: Within Functional Limits Gait velocity: slow General Gait Details: kyphotic posture    Exercises General Exercises - Lower Extremity Ankle Circles/Pumps: AROM;Both;10 reps;Seated Long Arc Quad: AROM;Both;10 reps;Seated Hip Flexion/Marching: AROM;Both;10 reps;Seated   PT Goals (current goals can now be found in the care plan section)    Visit Information  Last PT Received On: 02/15/13       Cognition  Cognition Arousal/Alertness: Awake/alert Behavior During Therapy: [facility] for tasks assessed/performed Overall Cognitive Status: Within Functional Limits for tasks assessed       End of Session PT - End of Session Equipment Utilized During Treatment: Gait belt Activity Tolerance: Patient limited by fatigue Patient left: in chair;with chair alarm set;with family/visitor present     Lurena Nida, PTA/CLT 02/15/2013, 11:03 AM

## 2013-02-15 NOTE — Progress Notes (Signed)
REVIEWED.  

## 2013-02-15 NOTE — Progress Notes (Signed)
Patient being discharged to [facility].  Report called and given to [facility] at facility.  Patient request to stay in gown for transfer.  Packet complete per SW and sent with patient.  No IV in place at this time.  Stable to DC.  Educated patient on orthostatic hypotension.  Reminded patient to sit and stand slowly rather than quickly to prevent feeling dizzy.

## 2013-02-15 NOTE — Progress Notes (Signed)
Called by RN as patient had been discharged to SNF without her narcotics  Chart was reviewed. She was getting Hydrocodone/Acetaminophen for back pain. This was presumably due to compression fracture detected on CT scan. Case was discussed by rounding MD's with neurosurgery as well.   There seems to be enough justification to write a script for Norco at this time considering this history. #30 prescribed with no refills.  Jo Cerone 8:39 PM

## 2013-02-15 NOTE — Clinical Social Work Placement (Signed)
Clinical Social Work Department CLINICAL SOCIAL WORK PLACEMENT NOTE 02/15/2013  Patient:  Kaitlyn Cooper, Kaitlyn Cooper  Account Number:  [id number] Admit date:  02/09/2013  Clinical Social Worker:  Derenda Fennel, LCSW  Date/time:  02/14/2013 02:55 PM  Clinical Social Work is seeking post-discharge placement for this patient at the following level of care:   SKILLED NURSING   (*CSW will update this form in Epic as items are completed)   02/14/2013  Patient/family provided with [facility] Department of Clinical Social Work's list of facilities offering this level of care within the geographic area requested by the patient (or if unable, by the patient's family).  02/14/2013  Patient/family informed of their freedom to choose among providers that offer the needed level of care, that participate in Medicare, Medicaid or managed care program needed by the patient, have an available bed and are willing to accept the patient.  02/14/2013  Patient/family informed of [facility]' ownership interest in [facility], as well as of the fact that they are under no obligation to receive care at this facility.  PASARR submitted to EDS on 02/14/2013 PASARR number received from EDS on 02/15/2013  FL2 transmitted to all facilities in geographic area requested by pt/family on  02/14/2013 FL2 transmitted to all facilities within larger geographic area on   Patient informed that his/her managed care company has contracts with or will negotiate with  certain facilities, including the following:     Patient/family informed of bed offers received:  02/15/2013 Patient chooses bed at [facility] Physician recommends and patient chooses bed at  [facility]  Patient to be transferred to [facility] on  02/15/2013 Patient to be transferred to facility by RN  The following physician request were entered in Epic:   Additional Comments:  Derenda Fennel, LCSW [phone]

## 2013-02-15 NOTE — Consult Note (Signed)
[facility] Demira Gwynne A. Gerilyn Pilgrim, MD     [email or web address]          Kaitlyn Cooper is an 79 y.o. female.   ASSESSMENT/PLAN: 1. Generalized fatigue and weakness of unclear etiology. The patient does have some findings that may suggest neuromuscular problems particularly myasthenia gravis or myopathy. She has binocular diplopia, dysphagia and the proximal muscle weakness. The combination of symptoms suggest possibility of myasthenia gravis or myopathy. She has a normal CPK which would argue against a riproaring myopathy however. The patient will undergo titers for acetylcholine receptor antibodies all 3 types hopefully, blocking, binding and modulating. She also will undergo additional blood tests and for vitamin D level and homocysteine. There is suggestion per the family that she is malnourished and has lost some weight due to poor appetite. We will go ahead and treat the patient empirically with thiamine.  2. Orthostatic hypertension of unclear etiology. Support stockings thigh-high are suggested. She does not have a significant neuropathy given the preserved reflexes.  3.-Post head injury vertigo. This can be treated symptomatically although I suspect that this will improve with time  The patient presents with over a one-year history of marked global fatigue and weakness of unclear etiology. She has been worked up extensively. She has had chronic diarrhea and has been followed by Dr. Karilyn Cota I believe for her symptoms. The patient also has had some problems with dysphagia which they think is due to just for general stricture. She has undergone stretching but the family reports that he still has some difficulty swallowing presumably due to weakness of the muscles. The patient reports that she just feels weak all over although there is suggestion that legs are weak especially on standing. I did question her repeatedly if the weakness was positional but she clearly stated that she has  marked fatigue and weakness globally without any clear postural worsening although this is in question. The patient apparently reports that her legs tend to give out when she stands up. It appears that she slept recently and fell on yesterday sustaining a thoracic fracture. She did hit the back of her head although there is no obvious injuries. She indicates that she's had some dizziness ever since she fell. The dizziness is described as a spinning sensation which she has when she tries to stand up. The patient has been orthostatic during the hospital. It has been checked about 5 times and each time he has showed significant drop in the systolic of 30 points or more. There has also been couple of times with the diastolic dropping more than 10 points. The patient does not report dysarthria. She does admit to having some binocular diplopia which occurs infrequently but they do occur. She does not report significant numbness or tingling of the extremities.  GENERAL: This a pleasant slightly on the nurse lady in no acute distress.  HEENT: Supple. There is no evidence of trauma involving the posterior aspect of the head and neck. She does seem to have some bruising left cheek which she thinks happened overnight. She apparently bruises easily. Neck extension 5 but neck flexion 4 minus/5.  ABDOMEN: soft  EXTREMITIES: No edema. Arm abduction time continued for a full 2 minutes.   BACK: Normal.  SKIN: Normal by inspection.    MENTAL STATUS: Alert and oriented. Speech, language and cognition are generally intact. Judgment and insight normal.   CRANIAL NERVES: Pupils are equal, round and reactive to light and accommodation; extra ocular movements are full, there is  no significant nystagmus; visual fields are full; upper and lower facial muscles are normal in strength and symmetric, there is no flattening of the nasolabial folds; tongue is midline; uvula is midline; shoulder elevation is normal.  MOTOR: She  has good distal strength involving the hands and legs. Left deltoid 4+ and the right 4 minus. Hip flexion bilaterally 4/5.  COORDINATION: Left finger to nose is normal, right finger to nose is normal, No rest tremor; no intention tremor; no postural tremor; no bradykinesia.  REFLEXES: Deep tendon reflexes are symmetrical but slightly brisk throughout. Babinski reflexes are flexor bilaterally.   SENSATION: Normal to light touch.   Past Medical History  Diagnosis Date  . Anemia     followed by Dr.Neijstrom  . Hyperlipidemia   . Depression   . Hypothyroidism   . History of atherosclerotic cardiovascular disease   . History of colonoscopy     02/2002 showed few diverticula at sigmoid colon otherwise normal colonoscopy and terminal ileoscopy 1995 several colon polyps ,tubular adenoma  . GERD (gastroesophageal reflux disease)     chronic  . GERD (gastroesophageal reflux disease)     chronic gerd  . Tiredness     Past Surgical History  Procedure Laterality Date  . Insert / replace / remove pacemaker  01/17/2009    [employer] Cardiac Pacemaker   . Cardiac catheterization  1998  . Cholecystectomy    . 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 Esophagogastroduodenoscopy (egd) with esophageal dilation  03/22/2012    Procedure: ESOPHAGOGASTRODUODENOSCOPY (EGD) WITH ESOPHAGEAL DILATION;  Surgeon: Malissa Hippo, MD;  Location: [facility];  Service: Endoscopy;  Laterality: N/A;  730  . Colonoscopy N/A 12/29/2012    Procedure: COLONOSCOPY;  Surgeon: Malissa Hippo, MD;  Location: [facility];  Service: Endoscopy;  Laterality: N/A;  130  . Esophagogastroduodenoscopy (egd) with esophageal dilation N/A 02/12/2013    Procedure: ESOPHAGOGASTRODUODENOSCOPY (EGD) WITH ESOPHAGEAL DILATION;  Surgeon: Malissa Hippo, MD;  Location: [facility];  Service: Endoscopy;  Laterality: N/A;    History reviewed. No pertinent family history.  Social History:  reports that she  quit smoking about 41 years ago. She has never used smokeless tobacco. She reports that she does not drink alcohol or use illicit drugs.  Allergies:  Allergies  Allergen Reactions  . Penicillins Rash    Medications: Prior to Admission medications   Medication Sig Start Date End Date Taking? Authorizing Provider  acetaminophen (TYLENOL) 500 MG tablet Take 500 mg by mouth every 6 (six) hours as needed. Pain   Yes Historical Provider, MD  Cholecalciferol (VITAMIN D) 400 UNITS capsule Take 400 Units by mouth daily.     Yes Historical Provider, MD  cyanocobalamin (,VITAMIN B-12,) 1000 MCG/ML injection Inject 1,000 mcg into the muscle every 30 (thirty) days.   Yes Historical Provider, MD  Doxylamine Succinate, Sleep, (SLEEP AID PO) Take 1 tablet by mouth at bedtime as needed (for sleep).   Yes Historical Provider, MD  levothyroxine (SYNTHROID, LEVOTHROID) 75 MCG tablet Take 1 tablet (75 mcg total) by mouth daily before breakfast. 02/07/13  Yes Babs Sciara, MD  loperamide (IMODIUM A-D) 2 MG tablet Take 1 tablet (2 mg total) by mouth daily. 12/29/12  Yes Malissa Hippo, MD  psyllium (METAMUCIL SMOOTH TEXTURE) 28 % packet Take 1 packet by mouth at bedtime. 12/29/12  Yes Malissa Hippo, MD  ranitidine (ZANTAC) 150 MG tablet Take 150 mg by mouth  2 (two) times daily. For acid reflux   Yes Historical Provider, MD  sertraline (ZOLOFT) 100 MG tablet Take 100 mg by mouth every morning.   Yes Historical Provider, MD    Scheduled Meds: . famotidine  20 mg Oral BID  . feeding supplement (ENSURE COMPLETE)  237 mL Oral BID BM  . heparin subcutaneous  5,000 Units Subcutaneous Q8H  . levothyroxine  50 mcg Oral QAC breakfast  . loperamide  2 mg Oral BID  . sertraline  50 mg Oral q morning - 10a  . sucralfate  1 g Oral TID WC & HS   Continuous Infusions:  PRN Meds:.HYDROcodone-acetaminophen, ondansetron (ZOFRAN) IV   Blood pressure 127/69, pulse 79, temperature 98.3 F (36.8 C), temperature source  Oral, resp. rate 19, height 5' 3.5" (1.613 m), weight 47 kg (103 lb 9.9 oz), SpO2 91.00%.   Results for orders placed during the hospital encounter of 02/09/13 (from the past 48 hour(s))  RENAL FUNCTION PANEL     Status: Abnormal   Collection Time    02/15/13  5:13 AM      Result Value Range   Sodium 138  137 - 147 mEq/L   Potassium 3.4 (*) 3.7 - 5.3 mEq/L   Chloride 105  96 - 112 mEq/L   CO2 21  19 - 32 mEq/L   Glucose, Bld 86  70 - 99 mg/dL   BUN 17  6 - 23 mg/dL   Creatinine, Ser 1.61  0.50 - 1.10 mg/dL   Calcium 8.7  8.4 - 09.6 mg/dL   Phosphorus 2.4  2.3 - 4.6 mg/dL   Albumin 2.7 (*) 3.5 - 5.2 g/dL   GFR calc non Af Amer 76 (*) >90 mL/min   GFR calc Af Amer 88 (*) >90 mL/min   Comment: (NOTE)     The eGFR has been calculated using the CKD EPI equation.     This calculation has not been validated in all clinical situations.     eGFR's persistently <90 mL/min signify possible Chronic Kidney     Disease.  CBC WITH DIFFERENTIAL     Status: Abnormal   Collection Time    02/15/13  5:13 AM      Result Value Range   WBC 12.0 (*) 4.0 - 10.5 K/uL   RBC 2.91 (*) 3.87 - 5.11 MIL/uL   Hemoglobin 8.6 (*) 12.0 - 15.0 g/dL   HCT 04.5 (*) 40.9 - 81.1 %   MCV 92.4  78.0 - 100.0 fL   MCH 29.6  26.0 - 34.0 pg   MCHC 32.0  30.0 - 36.0 g/dL   RDW 91.4  78.2 - 95.6 %   Platelets 251  150 - 400 K/uL   Neutrophils Relative % 53  43 - 77 %   Neutro Abs 6.4  1.7 - 7.7 K/uL   Lymphocytes Relative 29  12 - 46 %   Lymphs Abs 3.4  0.7 - 4.0 K/uL   Monocytes Relative 13 (*) 3 - 12 %   Monocytes Absolute 1.6 (*) 0.1 - 1.0 K/uL   Eosinophils Relative 5  0 - 5 %   Eosinophils Absolute 0.6  0.0 - 0.7 K/uL   Basophils Relative 0  0 - 1 %   Basophils Absolute 0.0  0.0 - 0.1 K/uL    Dg Lumbar Spine Complete  02/14/2013   CLINICAL DATA:  Lower extremity weakness, fall.  EXAM: LUMBAR SPINE - COMPLETE 4+ VIEW  COMPARISON:  February 09, 2013.  FINDINGS:  Stable mild levoscoliosis of lumbar spine is  noted. Status post cholecystectomy. Residual contrast is noted in the colon. Atherosclerotic calcifications of abdominal aorta are noted. Diffuse osteopenia is noted. No fracture or spondylolisthesis is noted. Degenerative disc disease is noted at L1-2 with anterior osteophyte formation. Mild anterior osteophyte formation is also noted at L2-3.  IMPRESSION: Severe degenerative disc disease is noted at L1-2. Diffuse osteopenia is noted. No acute abnormality seen in the lumbar spine.   Electronically Signed   By: Roque Lias M.D.   On: 02/14/2013 14:25        Elainah Rhyne A. Gerilyn Pilgrim, M.D.  Diplomate, Biomedical engineer of Psychiatry and Neurology ( Neurology). 02/15/2013, 9:16 AM

## 2013-02-15 NOTE — Discharge Planning (Addendum)
Pt's sister Lauris Chroman) arrived on floor asking to confirm DC pain meds sent with pt to [facility]. Since I administered pain meds just prior to DC and pt's RN at DC is no longer in hospital, I will look at DC orders to confirm pain meds sent with patient.  [facility] is stating that pt only has Tylenol to take every 6 hours. Confimed by looking at DC papers that Dr. Gloriann Loan discharged pt with Tylenol Q6 PO PRN for pain. Also confirmed on MAR that pt was taking Norco 5-325 X1 PO Q4 PRN for pain.  Since Dr. Gloriann Loan has left hospital for today, I paged mid level hosp Dr. Craige Cotta to try to see if possible to get script written. Also paged Dr, Rito Ehrlich hospitalist.  Dr. Rito Ehrlich research situation and agreed to write script for pt, then came to floor to sign and administered script to sister to take to [facility].

## 2013-02-15 NOTE — Progress Notes (Signed)
    Subjective: Dysphagia improved. Ate chicken soup and crackers for lunch. No abdominal pain. Discharge to [facility] today.   Objective: Vital signs in last 24 hours: Temp:  [97.1 F (36.2 C)-98.6 F (37 C)] 98.3 F (36.8 C) (12/30 0539) Pulse Rate:  [68-79] 79 (12/30 0543) Resp:  [18-19] 19 (12/30 0543) BP: (127-171)/(62-71) 127/69 mmHg (12/30 0543) SpO2:  [90 %-96 %] 91 % (12/30 0543) Weight:  [103 lb 9.9 oz (47 kg)] 103 lb 9.9 oz (47 kg) (12/30 0500) Last BM Date: 02/10/13 General:   Alert and oriented, pleasant Head:  Normocephalic and atraumatic. Heart:  S1, S2 present, no murmurs noted.  Lungs: Clear to auscultation bilaterally, without wheezing, rales, or rhonchi.  Abdomen:  Bowel sounds present, soft, non-tender, non-distended. No HSM or hernias noted. No rebound or guarding. No masses appreciated  Extremities:  Without clubbing or edema.   Intake/Output from previous day: 12/29 0701 - 12/30 0700 In: 240 [P.O.:240] Out: -  Intake/Output this shift:    Lab Results:  Recent Labs  02/15/13 0513  WBC 12.0*  HGB 8.6*  HCT 26.9*  PLT 251   BMET  Recent Labs  02/13/13 0730 02/15/13 0513  NA 137 138  K 3.2* 3.4*  CL 105 105  CO2 16* 21  GLUCOSE 101* 86  BUN 14 17  CREATININE 0.79 0.74  CALCIUM 8.5 8.7   LFT  Recent Labs  02/15/13 0513  ALBUMIN 2.7*     Studies/Results: Dg Lumbar Spine Complete  02/14/2013   CLINICAL DATA:  Lower extremity weakness, fall.  EXAM: LUMBAR SPINE - COMPLETE 4+ VIEW  COMPARISON:  February 09, 2013.  FINDINGS: Stable mild levoscoliosis of lumbar spine is noted. Status post cholecystectomy. Residual contrast is noted in the colon. Atherosclerotic calcifications of abdominal aorta are noted. Diffuse osteopenia is noted. No fracture or spondylolisthesis is noted. Degenerative disc disease is noted at L1-2 with anterior osteophyte formation. Mild anterior osteophyte formation is also noted at L2-3.  IMPRESSION: Severe  degenerative disc disease is noted at L1-2. Diffuse osteopenia is noted. No acute abnormality seen in the lumbar spine.   Electronically Signed   By: Roque Lias M.D.   On: 02/14/2013 14:25    Assessment: 79 year old female with dysphagia s/p dilation of esophageal stricture and underlying diffuse esophageal dysmotility as evidence on BPE this admission. Low-dose Ismo was started this admission but then cancelled by attending physician. However, patient seems to be doing well clinically and appropriate for discharge to [facility]. Recommend close follow-up as outpatient with Dr. Patty Sermons office.   Plan: Pepcid BID Dysphagia 3 diet Follow-up as outpatient with Dr. Waverly Ferrari, ANP-BC [facility]    LOS: 6 days    02/15/2013, 1:16 PM

## 2013-02-15 NOTE — Clinical Social Work Note (Signed)
Pt d/c today to SNF. CSW presented bed offers to pt and sister. They choose [facility]. Facility notified. D/C summary faxed. Pt to transfer with RN.  Derenda Fennel, [place] [phone]

## 2013-02-15 NOTE — Discharge Summary (Signed)
Physician Discharge Summary  Kaitlyn Cooper ZOX:[id number] DOB: May 04, 1926 DOA: 02/09/2013  PCP: [name], MD  Admit date: 02/09/2013 Discharge date: 02/15/2013  Time spent: 50 minutes  Recommendations for Outpatient Follow-up:  1. Needs neurology followup as an outpatient 2. Recommend consideration for epidural steroid injections when seen by neurosurgeon 3. Recommend nutritionist input as an outpatient 4. Followup with regular care with primary care physician 5. Needs complete blood count in 5-7 days 6. Needs basic metabolic panelIn 5-7 days  7. Have made an appointment with oncology to determine utility /futility of chronic steroids for? Nonspecific IgA abnormality that was noted by prior oncologist Dr. Mariel Sleet.  She has not been restarted on this. 8. Recommend TSH mid January  9. Consider psychiatric input as an outpatient given significant grief reaction secondary to husband's passing.  Her Zoloft was changed on this admission  10. Will need extensive help with therapyand consideration for medications to increase her blood pressure-would consider this with the careful judgment and input of neurology as an outpatient   Discharge Diagnoses:  Principal Problem:   Gastroenteritis Active Problems:   HYPERLIPIDEMIA   ANEMIA   BRADYCARDIA   OSTEOPENIA   DIARRHEA, CHRONIC   DEPRESSION, HX OF   PPM-St.Jude   Weakness   Chronic diarrhea   Wedge compression fracture of T9 vertebra   Dehydration   ARF (acute renal failure)   Acute renal failure   Discharge Condition: fair to guarded  Diet recommendation: liberalize her diet whatever she wants to eat, please give her more salt  Filed Weights   02/13/13 0539 02/14/13 0539 02/15/13 0500  Weight: 49.6 kg (109 lb 5.6 oz) 47.6 kg (104 lb 15 oz) 47 kg (103 lb 9.9 oz)     Brief narrative:  79 y/o ?, known history bradycardia syndrome status post PPM 01/17/09, Chronic orthostasis dating back at least 01/2009 when her  pacemaker was placed [ recent interrogation 12/2012 was normal], history of temporal skin cancer 2004, cardiac cath 1998 Known chronic diarrhea~ 8 times per day [on imodium/metamucil] + Prior history 2 adenoma in 1995, no polyps 2004 [ colonoscopy early November 2014~? IBS], known Schatzki ring, chronic normocytic anemia c B12 deficiency with an elevated ESR [ used to be on prednisone under the direction of Dr. Mariel Sleet   Admitted 02/10/13 with history of sudden acute syncopal event in a setting of 2-3 weeks of chronic weakness. Found to have T9 thoracic fracture initially on emergency room visit and subsequently admitted later that day. She has been grieving the loss of her husband of 67 + years since 09/2012 and has had poor appetite per sister.  SHe did have solid food dysphagia and GI was consulted and she underwent EGD 12/27 See below    Hospital Course:   1.Acute on chronic diarrhea in a setting of IBS-no bowel movements in the last 24 hours. Resumed on home dose Imodium which she takes at home, gentle IV fluids overall feels much better-this is a chronic problem-given FLEETs enema on d/c with moderate effect on day of d/c  2. Multifactorial volume depletion secondary to diarrhea +orthostatic hypotension - Blood pressures actually are high now-is orthostatic-stop checking. Decrease dose of Isosorbide mononitrate-Neurology consulted this amdission who are working her up with Myasthenia panel and homocysteine levels-Will arrange OP follow-up.  Could trial Midodrine/Mestinon/Florinef in OP setting if thought feasible under Neuro guidance[not started in hospital as she has supine hypertension] 3. Falls secondary to #1 and 2 above. She has some T-spine back pain and  suggestion of T6 compression fracture on x-ray, minimal tenderness overlying T-spine, Dr. Thedore Mins discussed CT results with neurosurgeon on call Dr. Danielle Dess. Per neurosurgeon outpatient followup in his office in 3-4 weeks' time-Lower back  films show significant L1-2 degen disc disease.  Will follow up c Dr. Lesli Albee benefit from steroid injection to area] 4. Some chronic dysphagia. History of Es.stricture. GI reconsulted. 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 Keep OP follow up with Dr. Karilyn Cota 5. Unlikely pyelonephritis- Levaquin discontinued, Escherichia coli 45,000 colony-forming units only.  6. H/O depression stable on Zoloft- cut back dose to 50 mg qhs on 12/29.  7. History of tachybradycardia syndrome status post St. Jude pacemaker. Stable no acute issues on telemetry.  8.Hypothyroidism - TSH low on admission, reduced synthroid. Unclear how to interpret free T3 levels when patient is acutely ill. Recommend repetition and reevaluation of TSH this free T4 in about 3 weeks.  9. Chronic microcytic anemia with elevated ESR dating back as far as 2010- unlikely patient has polymyalgia rheumatica-discontinue by mouth steroids given risk for GI bleed-unfortunately her Hb has dropped after withdrawal of the Steroids.  I am inclined to have her re-evaluated as OP by Hematologist and have arranged an OP follow up.  suggest cbc in 1 week 10. Mild metabolic acidosis-bicarbonate has dropped from 20 on admission to 16-this is probably secondary to episodes of diarrhea. Completely resolved on d/c 11. Likely situation depression/appropriate grief reactioon to loss of husband. 12. Adult Failure to thrive-Body mass index is 18.06 kg/(m^2)., related to #11.has lost 9 pounds-believe this is situational rather than an oncological at this stage, although certainly she may benefit if thought appropriate from a workup as per oncology, given good EcOG status.  Continue ensure supplements 13. Osteoporosis-consider limited therapy from PPI standpoint if possible.  Consider TUMS and vitamin D.  Supplementation, please check vitamin D level in about 6 weeks     Consultants:  Gi-Dr. Karilyn Cota Neuro-Dr. Gerilyn Pilgrim  Procedures:  none Antibiotics:  Levaquin 12/25-12/28  Discharge Exam: Filed Vitals:   02/15/13 0543  BP: 127/69  Pulse: 79  Temp:   Resp: 19    General: EOMI NCAT Cardiovascular: S1-S2 no murmur rub or gallop Respiratory: clinically clear  Discharge Instructions   Future Appointments Provider Department Dept Phone   02/25/2013 10:00 AM Babs Sciara, MD [facility] [phone]   03/04/2013 2:30 PM Ap-Acapa Covering Provider [facility] [phone]   04/04/2013 10:00 AM Malissa Hippo, MD [facility] [phone]       Medication List    ASK your doctor about these medications       acetaminophen 500 MG tablet  Commonly known as:  TYLENOL  Take 500 mg by mouth every 6 (six) hours as needed. Pain     cyanocobalamin 1000 MCG/ML injection  Commonly known as:  (VITAMIN B-12)  Inject 1,000 mcg into the muscle every 30 (thirty) days.     levothyroxine 75 MCG tablet  Commonly known as:  SYNTHROID, LEVOTHROID  Take 1 tablet (75 mcg total) by mouth daily before breakfast.     loperamide 2 MG tablet  Commonly known as:  IMODIUM A-D  Take 1 tablet (2 mg total) by mouth daily.     psyllium 28 % packet  Commonly known as:  METAMUCIL SMOOTH TEXTURE  Take 1 packet by  mouth at bedtime.     ranitidine 150 MG tablet  Commonly known as:  ZANTAC  Take 150 mg by mouth 2 (two) times daily. For acid reflux     sertraline 100 MG tablet  Commonly known as:  ZOLOFT  Take 100 mg by mouth every morning.     SLEEP AID PO  Take 1 tablet by mouth at bedtime as needed (for sleep).     Vitamin D 400 UNITS capsule  Take 400 Units by mouth daily.       Allergies  Allergen Reactions  . Penicillins Rash       Follow-up Information   Follow up with [facility].   Contact information:   [address] [place] [address] [phone]      Follow up with LUKING,SCOTT, MD. Schedule an appointment as soon as possible for a visit in 3 days.   Specialty:  Family Medicine   Contact information:   [address] [place] [address] [phone]       Follow up with Stefani Dama, MD. Schedule an appointment as soon as possible for a visit in 1 week.   Specialty:  Neurosurgery   Contact information:   [address] [place] [address] [phone]       Follow up with Donnetta Hail, MD. Schedule an appointment as soon as possible for a visit in 1 week.   Specialty:  Rheumatology   Contact information:   [address]  [place] [address] [phone]       Follow up with REHMAN,NAJEEB U, MD. Schedule an appointment as soon as possible for a visit in 1 week.   Specialty:  Gastroenterology   Contact information:   [address], [address] [place] [address] [phone]       Follow up with Maurilio Lovely, MD On 03/04/2013. (at 2:15 pm)    Specialty:  Hematology and Oncology   Contact information:   [address] [place] [address] [phone]        The results of significant diagnostics from this hospitalization (including imaging, microbiology, ancillary and laboratory) are listed below for reference.    Significant Diagnostic Studies: Dg Thoracic Spine 2 View  02/09/2013   CLINICAL DATA:  Back pain post fall today  EXAM: THORACIC SPINE - 2 VIEW  COMPARISON:  Chest radiographs 220 T6 2014  FINDINGS: Twelve pairs of ribs.  Diffuse osseous demineralization.  Dextro convex thoracolumbar scoliosis apex T11/T12.  Marked compression deformity of the inferior endplate of T9 demonstrating approximately 50% anterior height loss, new since previous exam.  No additional fracture or dislocation identified.  Calcified AP window and left hilar adenopathy.  Extensive atherosclerotic calcification aorta.  Biapical  pleural parenchymal lung scarring unchanged.  IMPRESSION: Osseous demineralization with new inferior endplate compression fracture of T9 vertebral body since 04/14/2012, demonstrating approximately 50% anterior height loss.   Electronically Signed   By: Ulyses Southward M.D.   On: 02/09/2013 16:41   Dg Lumbar Spine Complete  02/14/2013   CLINICAL DATA:  Lower extremity weakness, fall.  EXAM: LUMBAR SPINE - COMPLETE 4+ VIEW  COMPARISON:  February 09, 2013.  FINDINGS: Stable mild levoscoliosis of lumbar spine is noted. Status post cholecystectomy. Residual contrast is noted in the colon. Atherosclerotic calcifications of abdominal aorta are noted. Diffuse osteopenia is noted. No fracture or spondylolisthesis is noted. Degenerative disc disease is noted at L1-2 with anterior osteophyte formation.  Mild anterior osteophyte formation is also noted at L2-3.  IMPRESSION: Severe degenerative disc disease is noted at L1-2. Diffuse osteopenia is noted. No acute abnormality seen in the lumbar spine.   Electronically Signed   By: Roque Lias M.D.   On: 02/14/2013 14:25   Dg Lumbar Spine Complete  02/09/2013   CLINICAL DATA:  Back pain post fall today  EXAM: LUMBAR SPINE - COMPLETE 4+ VIEW  COMPARISON:  03/07/2005  FINDINGS: Five non-rib-bearing lumbar vertebrae.  Levoconvex lumbar scoliosis apex L2.  Scattered facet degenerative changes.  Height loss of the T9 vertebral body as reported on thoracic radiographs.  Disc space narrowing with endplate spur formation at L1-L2 and L2-L3.  No lumbar fracture or subluxation.  No spondylolysis.  Extensive atherosclerotic calcification of the aorta, iliac arteries and abdominal branch vessels.  SI joints symmetric.  IMPRESSION: Osseous demineralization with degenerative disc and facet disease changes of the lumbar spine with associated levoconvex lumbar scoliosis.  No acute abnormalities.   Electronically Signed   By: Ulyses Southward M.D.   On: 02/09/2013 16:43   Ct Thoracic Spine Wo  Contrast  02/11/2013   CLINICAL DATA:  79 year old female with back pain after fall. Thoracic fracture on radiographs. Initial encounter.  EXAM: CT THORACIC SPINE WITHOUT CONTRAST  TECHNIQUE: Multidetector CT imaging of the thoracic spine was performed without intravenous contrast administration. Multiplanar CT image reconstructions were also generated.  COMPARISON:  Chest radiographs 02/09/2013 and earlier.  FINDINGS: Left chest cardiac pacemaker visible on the scout view.  CT images include the head thoracic levels T4 (only the inferior portion of the vertebral body) through T12 (in the superior endplate of L1), with normal thoracic and lumbar segmentation demonstrated between the 01/03/2010 comparison and the scout view today.  Osteopenia. There is a comminuted T9 vertebral body fracture with retropulsion of a posterior inferior endplate fracture fragment (series 6, image 15). AP spinal canal dimension decreased to 9-11 mm (versus 14-15 mm at unaffected levels). The pedicles appeared to remain intact, although there may be a nondisplaced fracture of the right T9 transverse process. Loss of height up to 35%. Visible T9 ribs appear intact.  Other visible thoracic levels and posterior ribs appear intact. There are bilateral layering pleural effusions which appear to be small. Calcified left hilar and mediastinal lymphadenopathy partially visible. Confluent apical pulmonary opacity, favor scarring but incompletely visible. Negative visualized upper abdominal viscera.  IMPRESSION: 1. Acute to subacute appearing comminuted T9 vertebral body fracture with 35% loss of height and posterior retropulsion of the posterior inferior endplate fragment producing the AP spinal canal by 30%. Osteopenia. Pedicles appear to remain intact although there may be an associated nondisplaced fracture of the right T9 transverse process.  2.  Small layering pleural effusions.   Electronically Signed   By: Augusto Gamble M.D.   On: 02/11/2013  11:15   Dg Esophagus  02/11/2013   CLINICAL DATA:  Dysphagia, feels like food getting stuck in upper to mid chest, past history of esophageal stricture and dilatation  EXAM: ESOPHOGRAM/BARIUM SWALLOW  TECHNIQUE: Single contrast examination was performed using thin barium. Patient also swallowed a 12.5 mm diameter barium tablet.  COMPARISON:  None  FLUOROSCOPY TIME:  2 min 30 seconds  FINDINGS: Severe diffuse impairment of esophageal motility.  Poor clearance of tracer by the primary peristaltic waves is identified.  Numerous secondary and tertiary waves are seen.  Intermittent retrograde peristalsis.  Tiny Zenker diverticulum.  Additional diverticula are seen at the mid to distal thoracic  esophagus.  Wall appears smooth without irregularity or ulceration.  Moderate sized hiatal hernia identified.  12.5 mm diameter barium tablet obstructed at the distal esophagus several cm proximal to the gastroesophageal junction and could not pass despite multiple swallows of barium and water.  No persistent intraluminal filling defects.  Targeted rapid sequence imaging of the cervical esophagus and hypopharynx demonstrated no laryngeal penetration or aspiration.  Piriform sinus residuals were noted unilaterally, seen only on lateral view, likely on right.  IMPRESSION: Significant diffuse impairment of esophageal motility; clearance of contrast from the esophagus is facilitated by upright positioning and gravity.  Tiny Zenker diverticulum.  Multiple mid to distal thoracic esophageal diverticula.  Moderate-sized hiatal hernia.  Obstruction of a 12.5 mm diameter barium tablet several cm proximal to the GE junction compatible with stricture.   Electronically Signed   By: Ulyses Southward M.D.   On: 02/11/2013 13:04    Microbiology: Recent Results (from the past 240 hour(s))  URINE CULTURE     Status: None   Collection Time    02/10/13  7:00 AM      Result Value Range Status   Specimen Description URINE, CLEAN CATCH   Final    Special Requests NONE   Final   Culture  Setup Time     Final   Value: 02/10/2013 23:49     Performed at [facility] Count     Final   Value: 45,000 COLONIES/ML     Performed at [facility]   Culture     Final   Value: ESCHERICHIA COLI     Performed at [facility]   Report Status 02/13/2013 FINAL   Final   Organism ID, Bacteria ESCHERICHIA COLI   Final  SURGICAL PCR SCREEN     Status: None   Collection Time    02/12/13  4:46 AM      Result Value Range Status   MRSA, PCR NEGATIVE  NEGATIVE Final   Staphylococcus aureus NEGATIVE  NEGATIVE Final   Comment:            The Xpert SA Assay (FDA     approved for NASAL specimens     in patients over 27 years of age),     is one component of     a comprehensive surveillance     program.  Test performance has     been validated by [employer] for patients greater     than or equal to 78 year old.     It is not intended     to diagnose infection nor to     guide or monitor treatment.     Labs: Basic Metabolic Panel:  Recent Labs Lab 02/09/13 2155 02/10/13 0516 02/11/13 0550 02/13/13 0730 02/15/13 0513  NA 134* 138 143 137 138  K 4.9 4.5 3.8 3.2* 3.4*  CL 97 104 113* 105 105  CO2 20 18* 16* 16* 21  GLUCOSE 136* 99 78 101* 86  BUN 37* 33* 18 14 17   CREATININE 1.41* 1.19* 0.92 0.79 0.74  CALCIUM 10.1 8.8 8.3* 8.5 8.7  PHOS  --   --   --   --  2.4   Liver Function Tests:  Recent Labs Lab 02/15/13 0513  ALBUMIN 2.7*   No results found for this basename: LIPASE, AMYLASE,  in the last 168 hours No results found for this basename: AMMONIA,  in the last 168 hours CBC:  Recent Labs Lab 02/09/13 2155 02/10/13 0516 02/15/13 0513  WBC 11.7* 10.3 12.0*  NEUTROABS 9.3*  --  6.4  HGB 10.8* 10.1* 8.6*  HCT 33.8* 29.4* 26.9*  MCV 93.6 93.3 92.4  PLT 249 228 251   Cardiac Enzymes: No results found for this basename: CKTOTAL, CKMB, CKMBINDEX, TROPONINI,  in the last 168  hours BNP: BNP (last 3 results) No results found for this basename: PROBNP,  in the last 8760 hours CBG: No results found for this basename: GLUCAP,  in the last 168 hours     Signed:  Rhetta Mura  [facility] 02/15/2013, 12:25 PM

## 2013-02-16 ENCOUNTER — Non-Acute Institutional Stay (SKILLED_NURSING_FACILITY): Payer: Medicare Other | Admitting: Internal Medicine

## 2013-02-16 DIAGNOSIS — S22009A Unspecified fracture of unspecified thoracic vertebra, initial encounter for closed fracture: Secondary | ICD-10-CM

## 2013-02-16 DIAGNOSIS — E46 Unspecified protein-calorie malnutrition: Secondary | ICD-10-CM

## 2013-02-16 DIAGNOSIS — K222 Esophageal obstruction: Secondary | ICD-10-CM

## 2013-02-16 DIAGNOSIS — K589 Irritable bowel syndrome without diarrhea: Secondary | ICD-10-CM

## 2013-02-16 DIAGNOSIS — S22000A Wedge compression fracture of unspecified thoracic vertebra, initial encounter for closed fracture: Secondary | ICD-10-CM

## 2013-02-16 DIAGNOSIS — I951 Orthostatic hypotension: Secondary | ICD-10-CM

## 2013-02-16 NOTE — Progress Notes (Signed)
Patient ID: Kaitlyn Cooper, female   DOB: 1926-09-20, 79 y.o.   MRN: [id number]  Facility; [facility] Chief complaint; admission to SNF post admit to [facility] from 12/24 to 12/30  History; this is a patient who tells me she was relatively well up until about a week before her this admission to hospital. She suffered a fall and developed mid back pain. An x-ray of her T-spine showed T9 compression fracture. An x-ray of her lumbar spine showed severe L1-L2 degenerative disc disease. She was also noted to have acute on chronic diarrhea in the setting of urine or bowels syndrome she's been on Imodium at home as directed by gastroenterology. She was rehydrated. She was noted by the attending hospitalist to have orthostatic hypotension. She saw neurology who worked her up with a myasthenia panel. And a homocysteine level. I also note she had a question fasting cortisol level at 4.4 although I don't think this is followed. She is not a diabetic. Her B12 levels were actually elevated  In reviewing [place] flank I note that she saw Dr. Laurie Panda in the past for anemia felt to be anemia of chronic disease. She had a nonspecific polyclonal IgA. Slight elevation of her sedimentation rate per  With regards to her GI issues. She was discovered on this admission to have an esophageal stricture. An EGD on 12/27 showed an AVM which underwent coagulation and a stricture 5 cm proximal to the GE junction and this was dilated by Dr. Karilyn Cota. She still states she has had some trouble swallowing but does not describe true dysphagia  Past Medical History  Diagnosis Date  . Anemia     followed by Dr.Neijstrom  . Hyperlipidemia   . Depression   . Hypothyroidism   . History of atherosclerotic cardiovascular disease   . History of colonoscopy     02/2002 showed few diverticula at sigmoid colon otherwise normal colonoscopy and terminal ileoscopy 1995 several colon polyps ,tubular adenoma  . GERD (gastroesophageal reflux  disease)     chronic  . GERD (gastroesophageal reflux disease)     chronic gerd  . Tiredness Chronic Diarrhea    Past Surgical History  Procedure Laterality Date  . Insert / replace / remove pacemaker  01/17/2009    St.Jude Cardiac Pacemaker   . Cardiac catheterization  1998  . Cholecystectomy    . 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 Esophagogastroduodenoscopy (egd) with esophageal dilation  03/22/2012    Procedure: ESOPHAGOGASTRODUODENOSCOPY (EGD) WITH ESOPHAGEAL DILATION;  Surgeon: Malissa Hippo, MD;  [facility];  Service: Endoscopy;  Laterality: N/A;  730  . Colonoscopy N/A 12/29/2012    Procedure: COLONOSCOPY;  Surgeon: Malissa Hippo, MD;  [facility];  Service: Endoscopy;  Laterality: N/A;  130  . Esophagogastroduodenoscopy (egd) with esophageal dilation N/A 02/12/2013    Procedure: ESOPHAGOGASTRODUODENOSCOPY (EGD) WITH ESOPHAGEAL DILATION;  Surgeon: Malissa Hippo, MD;  [facility];  Service: Endoscopy;  Laterality: N/A;   Current Outpatient Prescriptions on File Prior to Visit  Medication Sig Dispense Refill  . acetaminophen (TYLENOL) 500 MG tablet Take 500 mg by mouth every 6 (six) hours as needed. Pain      . Cholecalciferol (VITAMIN D) 400 UNITS capsule Take 400 Units by mouth daily.        . cyanocobalamin (,VITAMIN B-12,) 1000 MCG/ML injection Inject 1,000 mcg into the muscle every 30 (thirty) days.      Marland Kitchen  Doxylamine Succinate, Sleep, (SLEEP AID PO) Take 1 tablet by mouth at bedtime as needed (for sleep).      . feeding supplement, ENSURE COMPLETE, (ENSURE COMPLETE) LIQD Take 237 mLs by mouth 2 (two) times daily between meals.      Marland Kitchen HYDROcodone-acetaminophen (NORCO) 5-325 MG per tablet Take 1 tablet by mouth every 4 (four) hours as needed for moderate pain.  30 tablet  0  . levothyroxine (SYNTHROID, LEVOTHROID) 75 MCG tablet Take 1 tablet (75 mcg total) by mouth daily before breakfast.  30 tablet  5  .  loperamide (IMODIUM A-D) 2 MG tablet Take 1 tablet (2 mg total) by mouth daily.  1 tablet  0  . psyllium (METAMUCIL SMOOTH TEXTURE) 28 % packet Take 1 packet by mouth at bedtime.      . ranitidine (ZANTAC) 150 MG tablet Take 150 mg by mouth 2 (two) times daily. For acid reflux      . sertraline (ZOLOFT) 50 MG tablet Take 2 tablets (100 mg total) by mouth every morning.      . sucralfate (CARAFATE) 1 GM/10ML suspension Take 10 mLs (1 g total) by mouth 4 (four) times daily -  with meals and at bedtime.  420 mL  0   No current facility-administered medications on file prior to visit.   Social history; lives independently in her on home until she had her recent fall. She does not describe a history of falls nor a history of using ambulatory assist devices. She is independent with ADLs and IADLs until the recent fall.   reports that she quit smoking about 41 years ago. She has never used smokeless tobacco. She reports that she does not drink alcohol or use illicit drugs.  Review of Systems;  Respiratory; no shortness of breath no cough Cardiac no chest pain no palpitations. Previously does not describe clear orthostatic symptoms to me although one would have to wonder if this has anything to do with her "weak spells" GI no abdominal pain long-standing history of diarrhea. She had a recent colonoscopy in November presumably had biopsies GU no voiding difficulties Endocrine she is not a diabetic Musculoskeletal; states for the last year she has had episodes of "her legs giving out on her" she just feels like her legs will collapse and she's been fortunate to hold on.  Physical exam Gen. pleasant cognitively alert woman in no distress. Daughter in the room Vitals supine patient's blood pressure is 170/50 pulse 74, standing immediately blood pressure 80/? After 1 minute 90/60. She is symptomatic pulse 90 Respiratory; clear entry bilaterally Cardiac heart sounds are normal there is no murmurs no  gallops Abdomen no liver no spleen no masses GU bladder is not distended there is no costovertebral angle Neurologic lower extremity weakness at 3+ out of 5 in the lower extremities proximally reflexes are intact both toes are downgoing.  Impression/plan #1 low back pain secondary to a T9 compression fracture and L1-L2 severe degenerative arthritis. This is the patient's major complaint #2 likely severe osteoporosis with a kyphoscoliosis. #3 severe orthostatic hypotension. This is going to be a problem as anything that can be done to lower her supine blood pressure is likely to make the orthostatic drop worse and vice versa. The exact cause of this is not clear. Note that she had a myasthenia panel however this is an unlikely cause. I think she needs a cortrosyn stimulation test which I will arrange. She is not a known diabetic. She has no overt extraparametal  signs nor any long track signs. #4 chronic anemia. This is already been checked in the facility at 9 this is normochromic normocytic. This was labeled as chronic disease previously by hematology . #5 esophageal stricture status post dilation presumably a peptic stricture. I'm not sure why she wasn't put on a proton pump inhibitor however she is on Zantac #6 depression made worse by the death of her husband a year to 2 ago. She is on Zoloft 100 mg a day #7 hypothyroidism on replacement. I do not see a recent TSH level #8 vitamin D deficiency although I did not see a level she was started on vitamin D requested check a level in 6 weeks #9 borderline normal cortisol level. I am going to do a stimulation test on her while she is here  I suspect this lady will rehabilitate well however the orthostatic drop in her blood pressure will prove difficult. Anything that I do to improve this pharmacologically is likely to worsen her supine hypertension I will recheck this at a later date. I am going to try to increase her analgesics for her back pain  gently. She has followup with neurosurgery in a month

## 2013-02-18 ENCOUNTER — Non-Acute Institutional Stay (SKILLED_NURSING_FACILITY): Payer: Medicare Other | Admitting: Internal Medicine

## 2013-02-18 ENCOUNTER — Inpatient Hospital Stay (HOSPITAL_COMMUNITY): Payer: Medicare Other | Attending: Internal Medicine

## 2013-02-18 DIAGNOSIS — K5900 Constipation, unspecified: Secondary | ICD-10-CM

## 2013-02-18 DIAGNOSIS — R059 Cough, unspecified: Secondary | ICD-10-CM

## 2013-02-18 DIAGNOSIS — F3289 Other specified depressive episodes: Secondary | ICD-10-CM

## 2013-02-18 DIAGNOSIS — F329 Major depressive disorder, single episode, unspecified: Secondary | ICD-10-CM

## 2013-02-18 DIAGNOSIS — R509 Fever, unspecified: Secondary | ICD-10-CM

## 2013-02-18 DIAGNOSIS — R05 Cough: Secondary | ICD-10-CM

## 2013-02-18 DIAGNOSIS — I951 Orthostatic hypotension: Secondary | ICD-10-CM

## 2013-02-18 NOTE — Progress Notes (Signed)
Patient ID: Kaitlyn Cooper, female   DOB: 04-May-1926, 80 y.o.   MRN: [id number]   This is an acute visit.  Level of care skilled.  Facility [facility].  The dates 02/19/2012.  Chief complaint-acute visit secondary to fever of unknown origin.  History of present illness.  Patient is a pleasant 80 year old female who suffered a fall and developed mid back pain.  She sustained a T9 compression fracture complicated with severe lumbar disc disease.  She also apparently had chronic diarrhea in the setting of irritable bowel syndrome and was rehydrated.  She also was noted to have orthostatic hypotension she was worked up by neurology with a myasthenia panel and homocysteine level.  He was also found to have an esophageal stricture that was dilated.  Telemetry overnight she developed a temperature of 100.4.  She denies any fever chills day although continues to appear quite weak-he also still continues with orthostatic hypotension I got 92/40 when she was standing today-160/58 when she was lying.  She also has a cough apparently productive of some green phlegm.  Regards her GI issues the there's concern by nursing that she has not had a recent bowel movement and concerned she may be constipated complicated with the Imodium as well as narcotic use for her pain.  Family medical social history as been reviewed per admission note on 02/16/2013.  Medications have been reviewed per MAR.  Review of systems.  She has been noted to have a fever at times-does not complain of chills.  Skin no complaints of itching or rashes.  Head ears eyes nose mouth and throat-does not really complain of any nasal discharge visual changes or sore throat.  Cardiac no complaints of chest pain or palpitations.  Respiratory-has a cough does not complaining of shortness of breath however.  GI-somewhat complicated history as noted above does not complaining of abdominal pain apparently has had some  constipation.  GU-does not complaining of dysuria.  Muscle skeletal continues with a history of back pain apparently the hydrocodone is helping.  Neurologic does not complaining of headache-apparently does have dizziness at times when standing with history of orthostatic hypotension.  Psych-apparently has a fairly significant history of depression status post recent loss of her husband.  Nursing staff notes that at times overnight she will be crying.  Physical exam.  Temperature has risen again to 101.5-pulse 76 respirations 20 and blood pressure lying 160/58-sitting 120/50-standing 92/40.--I took these manually  In general this is a frail elderly female in no distress lying in bed.  Her skin is warm and dry.  Oropharynx is clear mucous membranes moist.  Chest is clear to auscultation with somewhat shallow air entry no rhonchi rales or wheezes.  Heart is regular rate and rhythm without murmur gallop or rub she does not have significant lower extremity edema.  Abdomen is soft does not appear to be tender there are positive bowel sounds.  GU cannot really appreciate any suprapubic tenderness or distention.  Muscle skeletal has general frailty but moves all extremities x4 I do not see any deformities.  Neurologic is grossly intact her speech is clear.  Psych she does appear alert and oriented somewhat of a flat affect  Labs.  02/16/2013.  WBC 8.4 hemoglobin 9.0 platelets 262.  Sodium 140 potassium 3.7 BUN 15 creatinine 0.7.  02/15/2013.  Sodium 138 potassium 3.4 BUN 17 creatinine 0.74.  WBC 12.0 hemoglobin 8.6 platelets 251.  Assessment and plan.  #1-fever of unknown origin-she does have a history of cough-we'll  obtain a chest x-ray-also will start Mucinex 600 mg twice a day for 7 days and will empirically start Avelox 400 mg daily x7 days along with  probiotic for 7 days.  Also with history of flu in the facility will obtain a flu swab.  Monitor vital signs  closely with pulse ox every shift.  Also will obtain updated lab work CBC with differential and basic metabolic panel.  #2-history of hypotension orthostatic-this appears to be quite persistent she has been seen by neurology I suspect this may hinder her therapy somewhat----Dr. Dellia Nims has ordered a cortrosyn stimulation test..t  #2-history of constipation as this is somewhat of a challenging situation with her history apparently of irritable bowel however staff is fairly convincing that she has constipation issues now at this point will hold Imodium but certainly will have to keep a close eye on this.  #3-depression-apparently this is a significant issue she is on Zoloft 100 mg a day-Will obtain a psychiatric consult as well-she was pleasant and appropriate during exam --the nursing staff has noted frequent crying episodes status post recent loss of her husband.  [id number] note greater than 35 minutes spent assessing patient-- reviewing her hospital records--t discussing her status with her sister who is a responsible party at bedside as well as with nursing staff-and coordinating and formulating a plan of care

## 2013-02-20 DIAGNOSIS — R509 Fever, unspecified: Secondary | ICD-10-CM | POA: Insufficient documentation

## 2013-02-21 ENCOUNTER — Non-Acute Institutional Stay (SKILLED_NURSING_FACILITY): Payer: Medicare Other | Admitting: Internal Medicine

## 2013-02-21 DIAGNOSIS — K222 Esophageal obstruction: Secondary | ICD-10-CM

## 2013-02-21 DIAGNOSIS — R059 Cough, unspecified: Secondary | ICD-10-CM

## 2013-02-21 DIAGNOSIS — I951 Orthostatic hypotension: Secondary | ICD-10-CM

## 2013-02-21 DIAGNOSIS — R05 Cough: Secondary | ICD-10-CM

## 2013-02-21 NOTE — Progress Notes (Signed)
Patient ID: Kaitlyn Cooper, female   DOB: Mar 26, 1926, 80 y.o.   MRN: [id number] Facility; [facility] Chief complaint; continued complaints of dysphagia History; this is a somewhat complex 80 year old woman who came to [place] after a stay at [facility] with back pain secondary to compression fracture of T9 and severe degenerative changes at L1-L2. In the hospital she was noted to have chronic diarrhea, Orthostatic hypotension, and dysphagia. She underwent a EGD which showed a stricture which was dilated. Shortly after arrival here she developed a temperature to overall 101 and upper respiratory congestion. A nasopharyngeal swab for influenza A was negative for. She was empirically started on antibiotics and Tamiflu however I think this is unnecessary and I'll stop these today her chest x-ray was negative [see below]   Review of systems; GI patient is once again complaining of dysphagia type symptoms in the lower one third of her esophagus. She does not clearly complain of pain she cannot get her medications down and is not eating well although she does drink Respiratory cough seems better. She is afebrile  Physical examination Gen. the patient is not in any distress Respiratory; significant thoracic kyphosis however air entry is fairly clear Cardiac heart sounds are normal no murmurs. I did not recheck her postural drop today but will plan to do that in 2 days  Impression/plan #1 viral upper respiratory tract illness which was not influenza A this seems stable she does not require the Tamiflu her Avelox. Chest x-ray was clear #2 severe orthostatic hypotension I will try to recheck that again in 2 days time #3 chronic anemia which has been labeled as chronic disease in the past #4 peptic esophageal stricture. I'm going to send her back to see Dr. Tonita Phoenix: CHEST  2 VIEW   COMPARISON:  02/09/2013, 04/14/2012   FINDINGS: Mild cardiac enlargement. Two lead cardiac pacer with generator  over left thorax. Vascular pattern is normal. No infiltrate or consolidation. 4 mm calcified nodule laterally in the left upper lobe, stable and most consistent with a granuloma. Moderate hiatal hernia identified. Moderately severe thoracic compression deformity mid thoracic spine new from 04/14/2012. It appears stable from 02/09/2013. Pleural apical thickening is stable bilaterally.   IMPRESSION: Hiatal hernia. No acute cardiopulmonary process. Compression deformity thoracic spine.

## 2013-02-23 LAB — MYASTHENIA GRAVIS PANEL 2
Acety choline binding ab: <0.3 nmol/L
Acetylchol Block Ab: <15 % of inhibition (ref <15)
Acetylcholine Modulat Ab: <1 %

## 2013-02-25 ENCOUNTER — Ambulatory Visit: Payer: Medicare Other | Admitting: Family Medicine

## 2013-03-03 ENCOUNTER — Encounter (INDEPENDENT_AMBULATORY_CARE_PROVIDER_SITE_OTHER): Payer: Self-pay | Admitting: Internal Medicine

## 2013-03-03 ENCOUNTER — Ambulatory Visit (INDEPENDENT_AMBULATORY_CARE_PROVIDER_SITE_OTHER): Payer: Medicare Other | Admitting: Internal Medicine

## 2013-03-03 VITALS — BP 90/46 | HR 60 | Temp 97.3°F | Ht 63.0 in | Wt 96.5 lb

## 2013-03-03 DIAGNOSIS — R1310 Dysphagia, unspecified: Secondary | ICD-10-CM

## 2013-03-03 DIAGNOSIS — F5000 Anorexia nervosa, unspecified: Secondary | ICD-10-CM

## 2013-03-03 DIAGNOSIS — R634 Abnormal weight loss: Secondary | ICD-10-CM

## 2013-03-03 MED ORDER — MEGESTROL ACETATE 400 MG/10ML PO SUSP: 400.0000 mg | Freq: Two times a day (BID) | ORAL | Status: DC

## 2013-03-03 NOTE — Patient Instructions (Signed)
DG esophagram. Rx for Megace given to sister. Further recommendations to follow.

## 2013-03-03 NOTE — Progress Notes (Signed)
Subjective:     Patient ID: Kaitlyn Cooper, female   DOB: [birth year], 80 y.o.   MRN: [id number]  HPI Presents today with c/o dysphagia.  If she eat dry chicken, it will be slow to go down.  She ate blackeyed peas yesterday and were slow to go down.  Her sister says her appetite is not good.Requesting an appetite stimulant at this visit. She has lost 7 pounds since her last visit in December. Patients states she is just not hungry. She has a BM every 3 days.   EGD 02/12/2013 Dysphagia: Impression:  Small distal is a facial diverticula.  Esophageal stricture/spastic segment noted 5 cm proximal to GE junction. It was dilated to 16.5 mm resulting in mucosal disruption.  Noncritical ring at GE junction.  Moderate-sized sliding type hernia.  Gastric AV malformation with active bleeding/oozing controlled with argon plasma coagulation.       03/2012 EGD: Dysphagia Complications: None  Impression:  Two small diverticula noted at esophageal body.  High grade Schatzki's ring disrupted with balloon dilation to 19 mm.  Moderate size sliding hiatal hernia.  Small AV malformation at gastric fundus without stigmata of bleed.   12/2012 Colonoscopy:  Cecal Withdrawal Time: 11 minutes  Impression:  Normal mucosa of terminal ileum.  Small cecal polyp ablated via cold biopsy.  Mild Sigmoid colon diverticulosis.  No evidence of endoscopic colitis. Random biopsies taken from mucosa of sigmoid colon looking for collagenous or microscopic colitis.  Small external hemorrhoids.   Review of Systems see hpi Current Outpatient Prescriptions  Medication Sig Dispense Refill  . HYDROcodone-acetaminophen (NORCO) 5-325 MG per tablet Take 1 tablet by mouth every 4 (four) hours as needed for moderate pain.  30 tablet  0  . megestrol (MEGACE) 400 MG/10ML suspension Take 10 mLs (400 mg total) by mouth 2 (two) times daily.  240 mL  3   No current facility-administered medications for this visit.   Past Medical  History  Diagnosis Date  . Anemia     followed by Dr.Neijstrom  . Hyperlipidemia   . Depression   . Hypothyroidism   . History of atherosclerotic cardiovascular disease   . History of colonoscopy     02/2002 showed few diverticula at sigmoid colon otherwise normal colonoscopy and terminal ileoscopy 1995 several colon polyps ,tubular adenoma  . GERD (gastroesophageal reflux disease)     chronic  . GERD (gastroesophageal reflux disease)     chronic gerd  . Tiredness    Past Surgical History  Procedure Laterality Date  . Insert / replace / remove pacemaker  01/17/2009    St.Jude Cardiac Pacemaker   . Cardiac catheterization  1998  . Cholecystectomy    . 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 Esophagogastroduodenoscopy (egd) with esophageal dilation  03/22/2012    Procedure: ESOPHAGOGASTRODUODENOSCOPY (EGD) WITH ESOPHAGEAL DILATION;  Surgeon: Rogene Houston, MD;  [facility];  Service: Endoscopy;  Laterality: N/A;  730  . Colonoscopy N/A 12/29/2012    Procedure: COLONOSCOPY;  Surgeon: Rogene Houston, MD;  [facility];  Service: Endoscopy;  Laterality: N/A;  130  . Esophagogastroduodenoscopy (egd) with esophageal dilation N/A 02/12/2013    Procedure: ESOPHAGOGASTRODUODENOSCOPY (EGD) WITH ESOPHAGEAL DILATION;  Surgeon: Rogene Houston, MD;  [facility];  Service: Endoscopy;  Laterality: N/A;   Allergies  Allergen Reactions  . Penicillins Rash        Objective:   Physical Exam  Filed Vitals:   03/03/13 1045  BP: 90/46  Pulse: 60  Temp: 97.3 F (36.3 C)  Height: 5\' 3"  (1.6 m)  Weight: 96 lb 8 oz (43.772 kg)   Alert and oriented. Skin warm and dry. Oral mucosa is moist.   . Sclera anicteric, conjunctivae is pink. Thyroid not enlarged. No cervical lymphadenopathy. Lungs clear. Heart regular rate and rhythm.  Abdomen is soft. Bowel sounds are positive. No hepatomegaly. No abdominal masses felt. No tenderness.  No  edema to lower extremities.  Patient is very thin.     Assessment:    Solid foods dysphagia. Recent EGD in December with a stricture. Esophageal stricture needs to be ruled out. Loss of appetite and weight loss ? From depression.     Plan:    DG esophagus. Megace 400mg  BID. Further recemendations to follow. May need a repeat EGD.

## 2013-03-04 ENCOUNTER — Encounter (HOSPITAL_COMMUNITY): Payer: Medicare Other | Attending: Hematology and Oncology

## 2013-03-04 ENCOUNTER — Encounter (HOSPITAL_COMMUNITY): Payer: Self-pay

## 2013-03-04 ENCOUNTER — Other Ambulatory Visit (HOSPITAL_COMMUNITY): Payer: Medicare Other

## 2013-03-04 ENCOUNTER — Other Ambulatory Visit: Payer: Self-pay | Admitting: *Deleted

## 2013-03-04 VITALS — BP 99/63 | HR 68 | Temp 97.5°F | Resp 16

## 2013-03-04 DIAGNOSIS — D509 Iron deficiency anemia, unspecified: Secondary | ICD-10-CM | POA: Insufficient documentation

## 2013-03-04 DIAGNOSIS — M949 Disorder of cartilage, unspecified: Secondary | ICD-10-CM

## 2013-03-04 DIAGNOSIS — M899 Disorder of bone, unspecified: Secondary | ICD-10-CM

## 2013-03-04 DIAGNOSIS — D649 Anemia, unspecified: Secondary | ICD-10-CM

## 2013-03-04 DIAGNOSIS — E039 Hypothyroidism, unspecified: Secondary | ICD-10-CM | POA: Insufficient documentation

## 2013-03-04 DIAGNOSIS — R790 Abnormal level of blood mineral: Secondary | ICD-10-CM

## 2013-03-04 DIAGNOSIS — D510 Vitamin B12 deficiency anemia due to intrinsic factor deficiency: Secondary | ICD-10-CM | POA: Insufficient documentation

## 2013-03-04 LAB — CBC WITH DIFFERENTIAL/PLATELET
Basophils Absolute: 0 10*3/uL (ref 0.0–0.1)
Basophils Relative: 0 % (ref 0–1)
EOS ABS: 0.2 10*3/uL (ref 0.0–0.7)
EOS PCT: 2 % (ref 0–5)
HCT: 25.7 % — ABNORMAL LOW (ref 36.0–46.0)
Hemoglobin: 8.2 g/dL — ABNORMAL LOW (ref 12.0–15.0)
Lymphocytes Relative: 19 % (ref 12–46)
Lymphs Abs: 1.6 10*3/uL (ref 0.7–4.0)
MCH: 30.5 pg (ref 26.0–34.0)
MCHC: 31.9 g/dL (ref 30.0–36.0)
MCV: 95.5 fL (ref 78.0–100.0)
Monocytes Absolute: 0.7 10*3/uL (ref 0.1–1.0)
Monocytes Relative: 9 % (ref 3–12)
Neutro Abs: 5.7 10*3/uL (ref 1.7–7.7)
Neutrophils Relative %: 70 % (ref 43–77)
PLATELETS: 354 10*3/uL (ref 150–400)
RBC: 2.69 MIL/uL — ABNORMAL LOW (ref 3.87–5.11)
RDW: 13.3 % (ref 11.5–15.5)
WBC: 8.2 10*3/uL (ref 4.0–10.5)

## 2013-03-04 LAB — IRON AND TIBC
Iron: 32 ug/dL — ABNORMAL LOW (ref 42–135)
Saturation Ratios: 14 % — ABNORMAL LOW (ref 20–55)
TIBC: 225 ug/dL — AB (ref 250–470)
UIBC: 193 ug/dL (ref 125–400)

## 2013-03-04 LAB — RETICULOCYTES
RBC.: 2.69 MIL/uL — ABNORMAL LOW (ref 3.87–5.11)
RETIC CT PCT: 1.3 % (ref 0.4–3.1)
Retic Count, Absolute: 35 10*3/uL (ref 19.0–186.0)

## 2013-03-04 MED ORDER — HYDROCODONE-ACETAMINOPHEN 5-325 MG PO TABS: 1.0000 | ORAL_TABLET | ORAL | Status: DC | PRN

## 2013-03-04 NOTE — Telephone Encounter (Signed)
rx filled per protocol  

## 2013-03-04 NOTE — Patient Instructions (Signed)
[place] Discharge Instructions  RECOMMENDATIONS MADE BY THE CONSULTANT AND ANY TEST RESULTS WILL BE SENT TO YOUR REFERRING PHYSICIAN.  Lab work today. We will call if there are any unexpected results. STOP Sertraline. Continue B12 injections monthly. IV iron (feraheme) infusion 03/10/13 at 1015 am. Return to clinic in 4 weeks for follow up.  Thank you for choosing [facility] to provide your oncology and hematology care.  To afford each patient quality time with our providers, please arrive at least 15 minutes before your scheduled appointment time.  With your help, our goal is to use those 15 minutes to complete the necessary work-up to ensure our physicians have the information they need to help with your evaluation and healthcare recommendations.    Effective January 1st, 2014, we ask that you re-schedule your appointment with our physicians should you arrive 10 or more minutes late for your appointment.  We strive to give you quality time with our providers, and arriving late affects you and other patients whose appointments are after yours.    Again, thank you for choosing [facility].  Our hope is that these requests will decrease the amount of time that you wait before being seen by our physicians.       _____________________________________________________________  Should you have questions after your visit to [facility], please contact our office at [phone] between the hours of 8:30 a.m. and 5:00 p.m.  Voicemails left after 4:30 p.m. will not be returned until the following business day.  For prescription refill requests, have your pharmacy contact our office with your prescription refill request.

## 2013-03-04 NOTE — Progress Notes (Signed)
[place]  OFFICE PROGRESS NOTE  LUKING,SCOTT, MD [place] [address]  DIAGNOSIS: Pernicious anemia - Plan: Ferritin, Iron and TIBC, Erythropoietin, Reticulocytes, CANCELED: CA 125  ANEMIA - Plan: Ferritin, Iron and TIBC, Erythropoietin, CBC with Differential, Reticulocytes, Erythropoietin, CBC with Differential, Reticulocytes, Iron and TIBC, CANCELED: Cd4/cd8 (t-helper/t-suppressor cell)  HYPOTHYROIDISM  Decreased ferritin - Plan: Ferritin, Iron and TIBC  Iron deficiency anemia  Chief Complaint  Patient presents with  . Anemia    Iron and B12 deficiency    CURRENT THERAPY: Vitamin B12 monthly.  INTERVAL HISTORY: Kaitlyn Cooper 80 y.o. female returns for followup of pernicious anemia and iron deficiency with recently demonstrated drop and ferritin to a level of 33 on 02/03/2013. Hemoglobin on 02/15/2013 was only 8.6.  She has not been evaluated here for a urinary. She has develop orthostatic hypotension and had a fall on Christmas Eve fracturing her T9 vertebra. She is currently in a nursing home for rehabilitation but has been unable to undergo any lower extremity strengthening because of hypotensive changes when she sits up and stands. She is not on any antihypertensive medication but is on sertraline at bedtime. She denies any fever, night sweats, or any worsening back pain. She denies any lower extremity swelling or redness, lower extremity numbness or weakness other than that associated with pain when she tries to move. She has lost about 10 pounds in weight. She denies any nausea, vomiting, diarrhea, constipation, PND, orthopnea, or palpitations. Her sister intends to move in with her after she leaves a nursing home.  MEDICAL HISTORY: Past Medical History  Diagnosis Date  . Anemia     followed by Dr.Neijstrom  . Hyperlipidemia   . Depression   . Hypothyroidism   . History of atherosclerotic  cardiovascular disease   . History of colonoscopy     02/2002 showed few diverticula at sigmoid colon otherwise normal colonoscopy and terminal ileoscopy 1995 several colon polyps ,tubular adenoma  . GERD (gastroesophageal reflux disease)     chronic  . GERD (gastroesophageal reflux disease)     chronic gerd  . Tiredness   . T9 vertebral fracture 02/09/13    result of fall at home    INTERIM HISTORY: has COLONIC POLYPS, ADENOMATOUS; HYPOTHYROIDISM; HYPERLIPIDEMIA; ANEMIA; DEPRESSION; ESSENTIAL HYPERTENSION, BENIGN; BRADYCARDIA; ATHEROSCLEROTIC CARDIOVASCULAR DISEASE; GASTROESOPHAGEAL REFLUX DISEASE, CHRONIC; OSTEOPOROSIS; OSTEOPENIA; DYSPHAGIA; DIARRHEA, CHRONIC; DEPRESSION, HX OF; COLONIC POLYPS, HX OF; PPM-St.Jude; Esophageal stricture; Family hx of colon cancer; Pernicious anemia; Weakness; Gastroenteritis; Chronic diarrhea; Wedge compression fracture of T9 vertebra; Dehydration; ARF (acute renal failure); Acute renal failure; Fever, unspecified; Cough; Dysphagia, unspecified(787.20); Loss of weight; and Iron deficiency anemia on her problem list.    ALLERGIES:  is allergic to penicillins.  MEDICATIONS: has a current medication list which includes the following prescription(s): acetaminophen, vitamin d, cyanocobalamin, hydrocodone-acetaminophen, levothyroxine, magnesium hydroxide, megestrol, oxycodone, ranitidine, sertraline, diphenhydramine, feeding supplement (ensure complete), and loperamide.  SURGICAL HISTORY:  Past Surgical History  Procedure Laterality Date  . Insert / replace / remove pacemaker  01/17/2009    St.Jude Cardiac Pacemaker   . Cardiac catheterization  1998  . Cholecystectomy    . 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 Esophagogastroduodenoscopy (egd) with esophageal dilation  03/22/2012    Procedure: ESOPHAGOGASTRODUODENOSCOPY (EGD) WITH ESOPHAGEAL DILATION;  Surgeon: Rogene Houston, MD;  Location: [facility];  Service: Endoscopy;   Laterality: N/A;  730  . Colonoscopy N/A 12/29/2012    Procedure: COLONOSCOPY;  Surgeon: Rogene Houston, MD;  Location: [facility];  Service: Endoscopy;  Laterality: N/A;  130  . Esophagogastroduodenoscopy (egd) with esophageal dilation N/A 02/12/2013    Procedure: ESOPHAGOGASTRODUODENOSCOPY (EGD) WITH ESOPHAGEAL DILATION;  Surgeon: Rogene Houston, MD;  Location: [facility];  Service: Endoscopy;  Laterality: N/A;    FAMILY HISTORY: family history is not on file.  SOCIAL HISTORY:  reports that she quit smoking about 41 years ago. She has never used smokeless tobacco. She reports that she does not drink alcohol or use illicit drugs.  REVIEW OF SYSTEMS:  Other than that discussed above is noncontributory.  PHYSICAL EXAMINATION: ECOG PERFORMANCE STATUS: 2 - Symptomatic, <50% confined to bed  There were no vitals taken for this visit.  GENERAL:alert, no distress and comfortable SKIN: skin color, texture, turgor are normal, no rashes or significant lesions EYES: PERLA; Conjunctiva are pink and non-injected, sclera clear OROPHARYNX:no exudate, no erythema on lips, buccal mucosa, or tongue. NECK: supple, thyroid normal size, non-tender, without nodularity. No masses CHEST: Kyphosis with no breast masses. Tenderness over the lower thoracic spine. LYMPH:  no palpable lymphadenopathy in the cervical, axillary or inguinal LUNGS: clear to auscultation and percussion with normal breathing effort HEART: regular rate & rhythm and no murmurs. No S3.  ABDOMEN:abdomen soft, non-tender and normal bowel sounds. No masses. MUSCULOSKELETAL:no cyanosis of digits and no clubbing. Range of motion normal.  NEURO: alert & oriented x 3 with fluent speech, no focal motor/sensory deficits   LABORATORY DATA: Admission on 02/09/2013, Discharged on 02/15/2013  Component Date Value Range Status  . WBC 02/09/2013 11.7* 4.0 - 10.5 K/uL Final  . RBC 02/09/2013 3.61* 3.87 - 5.11 MIL/uL Final  . Hemoglobin  02/09/2013 10.8* 12.0 - 15.0 g/dL Final  . HCT 02/09/2013 33.8* 36.0 - 46.0 % Final  . MCV 02/09/2013 93.6  78.0 - 100.0 fL Final  . MCH 02/09/2013 29.9  26.0 - 34.0 pg Final  . MCHC 02/09/2013 32.0  30.0 - 36.0 g/dL Final  . RDW 02/09/2013 13.7  11.5 - 15.5 % Final  . Platelets 02/09/2013 249  150 - 400 K/uL Final  . Neutrophils Relative % 02/09/2013 80* 43 - 77 % Final  . Neutro Abs 02/09/2013 9.3* 1.7 - 7.7 K/uL Final  . Lymphocytes Relative 02/09/2013 12  12 - 46 % Final  . Lymphs Abs 02/09/2013 1.4  0.7 - 4.0 K/uL Final  . Monocytes Relative 02/09/2013 8  3 - 12 % Final  . Monocytes Absolute 02/09/2013 0.9  0.1 - 1.0 K/uL Final  . Eosinophils Relative 02/09/2013 0  0 - 5 % Final  . Eosinophils Absolute 02/09/2013 0.0  0.0 - 0.7 K/uL Final  . Basophils Relative 02/09/2013 0  0 - 1 % Final  . Basophils Absolute 02/09/2013 0.0  0.0 - 0.1 K/uL Final  . Sodium 02/09/2013 134* 135 - 145 mEq/L Final  . Potassium 02/09/2013 4.9  3.5 - 5.1 mEq/L Final  . Chloride 02/09/2013 97  96 - 112 mEq/L Final  . CO2 02/09/2013 20  19 - 32 mEq/L Final  . Glucose, Bld 02/09/2013 136* 70 - 99 mg/dL Final  . BUN 02/09/2013 37* 6 - 23 mg/dL Final  . Creatinine, Ser 02/09/2013 1.41* 0.50 - 1.10 mg/dL Final  . Calcium 02/09/2013 10.1  8.4 - 10.5 mg/dL Final  . GFR calc non Af Amer 02/09/2013 33* >90 mL/min Final  . GFR calc Af  Amer 02/09/2013 38* >90 mL/min Final   Comment: (NOTE)                          The eGFR has been calculated using the CKD EPI equation.                          This calculation has not been validated in all clinical situations.                          eGFR's persistently <90 mL/min signify possible Chronic Kidney                          Disease.  . Color, Urine 02/10/2013 YELLOW  YELLOW Final  . APPearance 02/10/2013 CLEAR  CLEAR Final  . Specific Gravity, Urine 02/10/2013 1.020  1.005 - 1.030 Final  . pH 02/10/2013 5.0  5.0 - 8.0 Final  . Glucose, UA 02/10/2013 NEGATIVE   NEGATIVE mg/dL Final  . Hgb urine dipstick 02/10/2013 TRACE* NEGATIVE Final  . Bilirubin Urine 02/10/2013 NEGATIVE  NEGATIVE Final  . Ketones, ur 02/10/2013 NEGATIVE  NEGATIVE mg/dL Final  . Protein, ur 02/10/2013 NEGATIVE  NEGATIVE mg/dL Final  . Urobilinogen, UA 02/10/2013 0.2  0.0 - 1.0 mg/dL Final  . Nitrite 02/10/2013 POSITIVE* NEGATIVE Final  . Leukocytes, UA 02/10/2013 SMALL* NEGATIVE Final  . Sodium 02/10/2013 138  135 - 145 mEq/L Final  . Potassium 02/10/2013 4.5  3.5 - 5.1 mEq/L Final  . Chloride 02/10/2013 104  96 - 112 mEq/L Final  . CO2 02/10/2013 18* 19 - 32 mEq/L Final  . Glucose, Bld 02/10/2013 99  70 - 99 mg/dL Final  . BUN 02/10/2013 33* 6 - 23 mg/dL Final  . Creatinine, Ser 02/10/2013 1.19* 0.50 - 1.10 mg/dL Final  . Calcium 02/10/2013 8.8  8.4 - 10.5 mg/dL Final  . GFR calc non Af Amer 02/10/2013 41* >90 mL/min Final  . GFR calc Af Amer 02/10/2013 47* >90 mL/min Final   Comment: (NOTE)                          The eGFR has been calculated using the CKD EPI equation.                          This calculation has not been validated in all clinical situations.                          eGFR's persistently <90 mL/min signify possible Chronic Kidney                          Disease.  . WBC 02/10/2013 10.3  4.0 - 10.5 K/uL Final  . RBC 02/10/2013 3.15* 3.87 - 5.11 MIL/uL Final  . Hemoglobin 02/10/2013 10.1* 12.0 - 15.0 g/dL Final  . HCT 02/10/2013 29.4* 36.0 - 46.0 % Final  . MCV 02/10/2013 93.3  78.0 - 100.0 fL Final  . MCH 02/10/2013 32.1  26.0 - 34.0 pg Final  . MCHC 02/10/2013 34.4  30.0 - 36.0 g/dL Final  . RDW 02/10/2013 13.8  11.5 - 15.5 % Final  . Platelets 02/10/2013 228  150 - 400 K/uL Final  . Hemoglobin A1C 02/10/2013 5.9* <5.7 %  Final   Comment: (NOTE)                                                                                                                         According to the ADA Clinical Practice Recommendations for 2011, when                           HbA1c is used as a screening test:                           >=6.5%   Diagnostic of Diabetes Mellitus                                    (if abnormal result is confirmed)                          5.7-6.4%   Increased risk of developing Diabetes Mellitus                          References:Diagnosis and Classification of Diabetes Mellitus,Diabetes                          AOZH,0865,78(IONGE 1):S62-S69 and Standards of Medical Care in                                  Diabetes - 2011,Diabetes XBMW,4132,44 (Suppl 1):S11-S61.  . Mean Plasma Glucose 02/10/2013 123* <117 mg/dL Final   Performed at [facility]  . TSH 02/10/2013 0.207* 0.350 - 4.500 uIU/mL Final   Performed at [facility]  . WBC, UA 02/10/2013 3-6  <3 WBC/hpf Final  . RBC / HPF 02/10/2013 0-2  <3 RBC/hpf Final  . Bacteria, UA 02/10/2013 MANY* RARE Final  . Specimen Description 02/10/2013 URINE, CLEAN CATCH   Final  . Special Requests 02/10/2013 NONE   Final  . Culture  Setup Time 02/10/2013    Final                   Value:02/10/2013 23:49                         Performed at [facility]  . Colony Count 02/10/2013    Final                   Value:45,000 COLONIES/ML                         Performed at [facility]  . Culture 02/10/2013    Final  Value:ESCHERICHIA COLI                         Performed at [facility]  . Report Status 02/10/2013 02/13/2013 FINAL   Final  . Organism ID, Bacteria 02/10/2013 ESCHERICHIA COLI   Final  . Sodium 02/11/2013 143  135 - 145 mEq/L Final  . Potassium 02/11/2013 3.8  3.5 - 5.1 mEq/L Final  . Chloride 02/11/2013 113* 96 - 112 mEq/L Final   DELTA CHECK NOTED  . CO2 02/11/2013 16* 19 - 32 mEq/L Final  . Glucose, Bld 02/11/2013 78  70 - 99 mg/dL Final  . BUN 02/11/2013 18  6 - 23 mg/dL Final  . Creatinine, Ser 02/11/2013 0.92  0.50 - 1.10 mg/dL Final  . Calcium 02/11/2013 8.3* 8.4 - 10.5 mg/dL Final  . GFR calc non Af Amer  02/11/2013 56* >90 mL/min Final  . GFR calc Af Amer 02/11/2013 64* >90 mL/min Final   Comment: (NOTE)                          The eGFR has been calculated using the CKD EPI equation.                          This calculation has not been validated in all clinical situations.                          eGFR's persistently <90 mL/min signify possible Chronic Kidney                          Disease.  Marland Kitchen TSH 02/11/2013 0.398  0.350 - 4.500 uIU/mL Final   Performed at [facility]  . T3, Free 02/11/2013 2.0* 2.3 - 4.2 pg/mL Final   Performed at [facility]  . T4, Total 02/11/2013 6.8  5.0 - 12.5 ug/dL Final   Performed at [facility]  . Cortisol, Plasma 02/12/2013 4.4   Final   Comment: (NOTE)                          AM:  4.3 - 22.4 ug/dL                          PM:  3.1 - 16.7 ug/dL                          Performed at [facility]  . Vitamin B-12 02/12/2013 1287* 211 - 911 pg/mL Final   Performed at [facility]  . MRSA, PCR 02/12/2013 NEGATIVE  NEGATIVE Final  . Staphylococcus aureus 02/12/2013 NEGATIVE  NEGATIVE Final   Comment:                                 The Xpert SA Assay (FDA                          approved for NASAL specimens                          in patients over 87 years of age),  is one component of                          a comprehensive surveillance                          program.  Test performance has                          been validated by [facility] for patients greater                          than or equal to 55 year old.                          It is not intended                          to diagnose infection nor to                          guide or monitor treatment.  . Sodium 02/13/2013 137  135 - 145 mEq/L Final  . Potassium 02/13/2013 3.2* 3.5 - 5.1 mEq/L Final  . Chloride 02/13/2013 105  96 - 112 mEq/L Final  . CO2 02/13/2013 16* 19 - 32 mEq/L Final    . Glucose, Bld 02/13/2013 101* 70 - 99 mg/dL Final  . BUN 02/13/2013 14  6 - 23 mg/dL Final  . Creatinine, Ser 02/13/2013 0.79  0.50 - 1.10 mg/dL Final  . Calcium 02/13/2013 8.5  8.4 - 10.5 mg/dL Final  . GFR calc non Af Amer 02/13/2013 74* >90 mL/min Final  . GFR calc Af Amer 02/13/2013 86* >90 mL/min Final   Comment: (NOTE)                          The eGFR has been calculated using the CKD EPI equation.                          This calculation has not been validated in all clinical situations.                          eGFR's persistently <90 mL/min signify possible Chronic Kidney                          Disease.  . Sodium 02/15/2013 138  137 - 147 mEq/L Final  . Potassium 02/15/2013 3.4* 3.7 - 5.3 mEq/L Final  . Chloride 02/15/2013 105  96 - 112 mEq/L Final  . CO2 02/15/2013 21  19 - 32 mEq/L Final  . Glucose, Bld 02/15/2013 86  70 - 99 mg/dL Final  . BUN 02/15/2013 17  6 - 23 mg/dL Final  . Creatinine, Ser 02/15/2013 0.74  0.50 - 1.10 mg/dL Final  . Calcium 02/15/2013 8.7  8.4 - 10.5 mg/dL Final  . Phosphorus 02/15/2013 2.4  2.3 - 4.6 mg/dL Final  . Albumin 02/15/2013 2.7* 3.5 - 5.2 g/dL Final  . GFR calc non  Af Amer 02/15/2013 76* >90 mL/min Final  . GFR calc Af Amer 02/15/2013 88* >90 mL/min Final   Comment: (NOTE)                          The eGFR has been calculated using the CKD EPI equation.                          This calculation has not been validated in all clinical situations.                          eGFR's persistently <90 mL/min signify possible Chronic Kidney                          Disease.  . WBC 02/15/2013 12.0* 4.0 - 10.5 K/uL Final  . RBC 02/15/2013 2.91* 3.87 - 5.11 MIL/uL Final  . Hemoglobin 02/15/2013 8.6* 12.0 - 15.0 g/dL Final  . HCT 02/15/2013 26.9* 36.0 - 46.0 % Final  . MCV 02/15/2013 92.4  78.0 - 100.0 fL Final  . MCH 02/15/2013 29.6  26.0 - 34.0 pg Final  . MCHC 02/15/2013 32.0  30.0 - 36.0 g/dL Final  . RDW 02/15/2013 14.2  11.5 - 15.5 %  Final  . Platelets 02/15/2013 251  150 - 400 K/uL Final  . Neutrophils Relative % 02/15/2013 53  43 - 77 % Final  . Neutro Abs 02/15/2013 6.4  1.7 - 7.7 K/uL Final  . Lymphocytes Relative 02/15/2013 29  12 - 46 % Final  . Lymphs Abs 02/15/2013 3.4  0.7 - 4.0 K/uL Final  . Monocytes Relative 02/15/2013 13* 3 - 12 % Final  . Monocytes Absolute 02/15/2013 1.6* 0.1 - 1.0 K/uL Final  . Eosinophils Relative 02/15/2013 5  0 - 5 % Final  . Eosinophils Absolute 02/15/2013 0.6  0.0 - 0.7 K/uL Final  . Basophils Relative 02/15/2013 0  0 - 1 % Final  . Basophils Absolute 02/15/2013 0.0  0.0 - 0.1 K/uL Final  . Homocysteine 02/15/2013 12.2  4.0 - 15.4 umol/L Final   Performed at [facility]  . Acetycholine binding ab 02/15/2013 <0.30   Final   Comment: (NOTE)                          Reference Ranges for Acetylcholine Receptor                           Binding Antibody:                          Negative: < or =0.30 nmol/L                          Equivocal:  0.31-0.49 nmol/L                          Positive: > or =0.50 nmol/L  . Acetylchol Block Ab 02/15/2013 <15  <15 % of inhibition Final  . Acetylcholine Modulat Ab 02/15/2013 <1   Final   Comment: (NOTE)  Reference Range:                                                                         < 32%                                                                           BINDING INHIBITION                          This test was developed and its performance characteristics have been                          determined by [employer], [place]. Performance characteristics refer to the analytical                          performance of the test.                          Performed at [facility] [place]  Office Visit on 02/03/2013  Component Date Value Range Status  . Ferritin 02/03/2013 33  10 - 291 ng/mL  Final  . Iron 02/03/2013 102  42 - 145 ug/dL Final  . UIBC 02/03/2013 304  125 - 400 ug/dL Final  . TIBC 02/03/2013 406  250 - 470 ug/dL Final  . %SAT 02/03/2013 25  20 - 55 % Final    PATHOLOGY: No new pathology. Peripheral smear shows normocytic normochromic cells.  Urinalysis    Component Value Date/Time   COLORURINE YELLOW 02/10/2013 0440   APPEARANCEUR CLEAR 02/10/2013 0440   LABSPEC 1.020 02/10/2013 0440   PHURINE 5.0 02/10/2013 0440   GLUCOSEU NEGATIVE 02/10/2013 0440   HGBUR TRACE* 02/10/2013 0440   BILIRUBINUR NEGATIVE 02/10/2013 0440   KETONESUR NEGATIVE 02/10/2013 0440   PROTEINUR NEGATIVE 02/10/2013 0440   UROBILINOGEN 0.2 02/10/2013 0440   NITRITE POSITIVE* 02/10/2013 0440   LEUKOCYTESUR SMALL* 02/10/2013 0440    RADIOGRAPHIC STUDIES: Dg Chest 2 View  02/18/2013   CLINICAL DATA:  Congestion, cough, fever  EXAM: CHEST  2 VIEW  COMPARISON:  02/09/2013, 04/14/2012  FINDINGS: Mild cardiac enlargement. Two lead cardiac pacer with generator over left thorax. Vascular pattern is normal. No infiltrate or consolidation. 4 mm calcified nodule laterally in the left upper lobe, stable and most consistent with a granuloma. Moderate hiatal hernia identified. Moderately severe thoracic compression deformity mid thoracic spine new from 04/14/2012. It appears stable from 02/09/2013. Pleural apical thickening is stable bilaterally.  IMPRESSION: Hiatal hernia. No acute cardiopulmonary process. Compression deformity thoracic spine.   Electronically Signed   By: Skipper Cliche M.D.   On: 02/18/2013 20:06   Dg Thoracic Spine 2  View  02/09/2013   CLINICAL DATA:  Back pain post fall today  EXAM: THORACIC SPINE - 2 VIEW  COMPARISON:  Chest radiographs 220 T6 2014  FINDINGS: Twelve pairs of ribs.  Diffuse osseous demineralization.  Dextro convex thoracolumbar scoliosis apex T11/T12.  Marked compression deformity of the inferior endplate of T9 demonstrating approximately 50% anterior height loss,  new since previous exam.  No additional fracture or dislocation identified.  Calcified AP window and left hilar adenopathy.  Extensive atherosclerotic calcification aorta.  Biapical pleural parenchymal lung scarring unchanged.  IMPRESSION: Osseous demineralization with new inferior endplate compression fracture of T9 vertebral body since 04/14/2012, demonstrating approximately 50% anterior height loss.   Electronically Signed   By: Lavonia Dana M.D.   On: 02/09/2013 16:41   Dg Lumbar Spine Complete  02/14/2013   CLINICAL DATA:  Lower extremity weakness, fall.  EXAM: LUMBAR SPINE - COMPLETE 4+ VIEW  COMPARISON:  February 09, 2013.  FINDINGS: Stable mild levoscoliosis of lumbar spine is noted. Status post cholecystectomy. Residual contrast is noted in the colon. Atherosclerotic calcifications of abdominal aorta are noted. Diffuse osteopenia is noted. No fracture or spondylolisthesis is noted. Degenerative disc disease is noted at L1-2 with anterior osteophyte formation. Mild anterior osteophyte formation is also noted at L2-3.  IMPRESSION: Severe degenerative disc disease is noted at L1-2. Diffuse osteopenia is noted. No acute abnormality seen in the lumbar spine.   Electronically Signed   By: Sabino Dick M.D.   On: 02/14/2013 14:25   Dg Lumbar Spine Complete  02/09/2013   CLINICAL DATA:  Back pain post fall today  EXAM: LUMBAR SPINE - COMPLETE 4+ VIEW  COMPARISON:  03/07/2005  FINDINGS: Five non-rib-bearing lumbar vertebrae.  Levoconvex lumbar scoliosis apex L2.  Scattered facet degenerative changes.  Height loss of the T9 vertebral body as reported on thoracic radiographs.  Disc space narrowing with endplate spur formation at L1-L2 and L2-L3.  No lumbar fracture or subluxation.  No spondylolysis.  Extensive atherosclerotic calcification of the aorta, iliac arteries and abdominal branch vessels.  SI joints symmetric.  IMPRESSION: Osseous demineralization with degenerative disc and facet disease changes of the  lumbar spine with associated levoconvex lumbar scoliosis.  No acute abnormalities.   Electronically Signed   By: Lavonia Dana M.D.   On: 02/09/2013 16:43   Ct Thoracic Spine Wo Contrast  02/11/2013   CLINICAL DATA:  80 year old female with back pain after fall. Thoracic fracture on radiographs. Initial encounter.  EXAM: CT THORACIC SPINE WITHOUT CONTRAST  TECHNIQUE: Multidetector CT imaging of the thoracic spine was performed without intravenous contrast administration. Multiplanar CT image reconstructions were also generated.  COMPARISON:  Chest radiographs 02/09/2013 and earlier.  FINDINGS: Left chest cardiac pacemaker visible on the scout view.  CT images include the head thoracic levels T4 (only the inferior portion of the vertebral body) through T12 (in the superior endplate of L1), with normal thoracic and lumbar segmentation demonstrated between the 01/03/2010 comparison and the scout view today.  Osteopenia. There is a comminuted T9 vertebral body fracture with retropulsion of a posterior inferior endplate fracture fragment (series 6, image 15). AP spinal canal dimension decreased to 9-11 mm (versus 14-15 mm at unaffected levels). The pedicles appeared to remain intact, although there may be a nondisplaced fracture of the right T9 transverse process. Loss of height up to 35%. Visible T9 ribs appear intact.  Other visible thoracic levels and posterior ribs appear intact. There are bilateral layering pleural effusions which appear to be small.  Calcified left hilar and mediastinal lymphadenopathy partially visible. Confluent apical pulmonary opacity, favor scarring but incompletely visible. Negative visualized upper abdominal viscera.  IMPRESSION: 1. Acute to subacute appearing comminuted T9 vertebral body fracture with 35% loss of height and posterior retropulsion of the posterior inferior endplate fragment producing the AP spinal canal by 30%. Osteopenia. Pedicles appear to remain intact although there may  be an associated nondisplaced fracture of the right T9 transverse process.  2.  Small layering pleural effusions.   Electronically Signed   By: Lars Pinks M.D.   On: 02/11/2013 11:15   Dg Esophagus  02/11/2013   CLINICAL DATA:  Dysphagia, feels like food getting stuck in upper to mid chest, past history of esophageal stricture and dilatation  EXAM: ESOPHOGRAM/BARIUM SWALLOW  TECHNIQUE: Single contrast examination was performed using thin barium. Patient also swallowed a 12.5 mm diameter barium tablet.  COMPARISON:  None  FLUOROSCOPY TIME:  2 min 30 seconds  FINDINGS: Severe diffuse impairment of esophageal motility.  Poor clearance of tracer by the primary peristaltic waves is identified.  Numerous secondary and tertiary waves are seen.  Intermittent retrograde peristalsis.  Tiny Zenker diverticulum.  Additional diverticula are seen at the mid to distal thoracic esophagus.  Wall appears smooth without irregularity or ulceration.  Moderate sized hiatal hernia identified.  12.5 mm diameter barium tablet obstructed at the distal esophagus several cm proximal to the gastroesophageal junction and could not pass despite multiple swallows of barium and water.  No persistent intraluminal filling defects.  Targeted rapid sequence imaging of the cervical esophagus and hypopharynx demonstrated no laryngeal penetration or aspiration.  Piriform sinus residuals were noted unilaterally, seen only on lateral view, likely on right.  IMPRESSION: Significant diffuse impairment of esophageal motility; clearance of contrast from the esophagus is facilitated by upright positioning and gravity.  Tiny Zenker diverticulum.  Multiple mid to distal thoracic esophageal diverticula.  Moderate-sized hiatal hernia.  Obstruction of a 12.5 mm diameter barium tablet several cm proximal to the GE junction compatible with stricture.   Electronically Signed   By: Lavonia Dana M.D.   On: 02/11/2013 13:04    ASSESSMENT:  #1. Vitamin B12 and iron  deficiency with ferritin of 33 with symptomatic anemia. #2. Orthostatic hypotension, possibly due to sertraline. #3. Hypothyroidism, on treatment. #4. Osteopenia. #5. T9 compression fracture.   PLAN:  #1. Intravenous iron scheduled for 03/10/2013 utilizing Feraheme. #2. Discontinue sertraline. #3. Followup in 4 weeks with CBC and differential not significantly improve, Procrit will be added. Transfusion was offered today for which the patient would like to hold off on at this time.   All questions were answered. The patient knows to call the clinic with any problems, questions or concerns. We can certainly see the patient much sooner if necessary.   I spent 25 minutes counseling the patient face to face. The total time spent in the appointment was 30 minutes.    Doroteo Bradford, MD 03/04/2013 2:32 PM

## 2013-03-05 LAB — FERRITIN: Ferritin: 82 ng/mL (ref 10–291)

## 2013-03-07 ENCOUNTER — Encounter: Payer: Self-pay | Admitting: Adult Health

## 2013-03-07 ENCOUNTER — Ambulatory Visit (INDEPENDENT_AMBULATORY_CARE_PROVIDER_SITE_OTHER): Payer: Medicare Other | Admitting: Adult Health

## 2013-03-07 ENCOUNTER — Ambulatory Visit (HOSPITAL_COMMUNITY)
Admit: 2013-03-07 | Discharge: 2013-03-07 | Disposition: A | Discharge status: Home / Self Care | Payer: Medicare Other | Attending: Internal Medicine | Admitting: Internal Medicine

## 2013-03-07 ENCOUNTER — Telehealth (HOSPITAL_COMMUNITY): Payer: Self-pay | Admitting: *Deleted

## 2013-03-07 VITALS — BP 111/66 | HR 70 | Ht 63.0 in | Wt 98.0 lb

## 2013-03-07 DIAGNOSIS — K449 Diaphragmatic hernia without obstruction or gangrene: Secondary | ICD-10-CM | POA: Insufficient documentation

## 2013-03-07 DIAGNOSIS — E860 Dehydration: Secondary | ICD-10-CM

## 2013-03-07 DIAGNOSIS — K225 Diverticulum of esophagus, acquired: Secondary | ICD-10-CM | POA: Insufficient documentation

## 2013-03-07 DIAGNOSIS — Z950 Presence of cardiac pacemaker: Secondary | ICD-10-CM

## 2013-03-07 DIAGNOSIS — F3289 Other specified depressive episodes: Secondary | ICD-10-CM

## 2013-03-07 DIAGNOSIS — D510 Vitamin B12 deficiency anemia due to intrinsic factor deficiency: Secondary | ICD-10-CM

## 2013-03-07 DIAGNOSIS — R1310 Dysphagia, unspecified: Secondary | ICD-10-CM | POA: Insufficient documentation

## 2013-03-07 DIAGNOSIS — R634 Abnormal weight loss: Secondary | ICD-10-CM

## 2013-03-07 DIAGNOSIS — D649 Anemia, unspecified: Secondary | ICD-10-CM

## 2013-03-07 DIAGNOSIS — K219 Gastro-esophageal reflux disease without esophagitis: Secondary | ICD-10-CM | POA: Insufficient documentation

## 2013-03-07 DIAGNOSIS — F329 Major depressive disorder, single episode, unspecified: Secondary | ICD-10-CM

## 2013-03-07 LAB — ERYTHROPOIETIN: Erythropoietin: 14.9 m[IU]/mL (ref 2.6–18.5)

## 2013-03-07 NOTE — Telephone Encounter (Signed)
Nursing staff at [facility] called to say that they have decreased dose of zoloft to 50 mg daily.

## 2013-03-07 NOTE — Assessment & Plan Note (Signed)
Restarted on Zoloft.

## 2013-03-07 NOTE — Assessment & Plan Note (Signed)
Being followed by Dr. Wolfgang Phoenix and is on megace. Encouraged to eat whatever she wants. She is still mourning the loss of her husband and is depressed.

## 2013-03-07 NOTE — Assessment & Plan Note (Signed)
Recently checked in December. Functioning appropriately.

## 2013-03-07 NOTE — Progress Notes (Signed)
HPI: Kaitlyn Cooper is an 80 year old patient of Dr. 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 The patient was recently admitted to [facility] for complaints of dizziness along with gastroenteritis. Found to have some volume depletion secondary to diarrhea with orthostatic hypotension. The patient also had recent fall with syncope. Pacemaker was interrogated and found to be functioning normally in November 2014. After fluid resuscitation, the patient was sent home on medications she was taking prior to admission with the exception of antihypertensives.   She is here having been admitted to [facility] for rehab.She is still very deconditioned.She remains very tired and has not much of an appetite. She is easily tearful.       Allergies  Allergen Reactions  . Penicillins Rash    Current Outpatient Prescriptions  Medication Sig Dispense Refill  . HYDROcodone-acetaminophen (NORCO) 5-325 MG per tablet Take 1 tablet by mouth every 4 (four) hours as needed for moderate pain. *MAX APAP-3 GR/24 HOURS FROM ALL SOURCES*  120 tablet  0  . megestrol (MEGACE) 400 MG/10ML suspension Take 10 mLs (400 mg total) by mouth 2 (two) times daily.  240 mL  3   No current facility-administered medications for this visit.    Past Medical History  Diagnosis Date  . Anemia     followed by Dr.Neijstrom  . Hyperlipidemia   . Depression   . Hypothyroidism   . History of atherosclerotic cardiovascular disease   . History of colonoscopy     02/2002 showed few diverticula at sigmoid colon otherwise normal colonoscopy and terminal ileoscopy 1995 several colon polyps ,tubular adenoma  . GERD (gastroesophageal reflux disease)     chronic  . GERD (gastroesophageal reflux disease)     chronic gerd  . Tiredness   . T9 vertebral fracture 02/09/13    result of fall at home    Past Surgical History  Procedure Laterality Date   . Insert / replace / remove pacemaker  01/17/2009    St.Jude Cardiac Pacemaker   . Cardiac catheterization  1998  . Cholecystectomy    . 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 Esophagogastroduodenoscopy (egd) with esophageal dilation  03/22/2012    Procedure: ESOPHAGOGASTRODUODENOSCOPY (EGD) WITH ESOPHAGEAL DILATION;  Surgeon: Rogene Houston, MD;  Location: [facility];  Service: Endoscopy;  Laterality: N/A;  730  . Colonoscopy N/A 12/29/2012    Procedure: COLONOSCOPY;  Surgeon: Rogene Houston, MD;  Location: [facility];  Service: Endoscopy;  Laterality: N/A;  130  . Esophagogastroduodenoscopy (egd) with esophageal dilation N/A 02/12/2013    Procedure: ESOPHAGOGASTRODUODENOSCOPY (EGD) WITH ESOPHAGEAL DILATION;  Surgeon: Rogene Houston, MD;  Location: [facility];  Service: Endoscopy;  Laterality: N/A;    MQK:MMNOTR of systems complete and found to be negative unless listed above  PHYSICAL EXAM BP 111/66  Pulse 70  Ht 5\' 3"  (1.6 m)  Wt 98 lb (44.453 kg)  BMI 17.36 kg/m2  SpO2 100%  General: Well developed, very thin,  nourished, in no acute distress Head: Eyes PERRLA, No xanthomas.   Normal cephalic and atramatic  Lungs: Clear bilaterally to auscultation and percussion. Heart: HRRR S1 S2, occasional extra systole.  Pulses are 2+ & equal.            No carotid bruit. No JVD.  No abdominal bruits. No femoral bruits. Abdomen: Bowel sounds are positive, abdomen  soft and non-tender without masses or                  Hernia's noted. Msk:  Back kyphosis, wheelchair dependent.t. Normal strength and tone for age. Extremities: No clubbing, cyanosis or edema.  DP +1 Neuro: Alert and oriented X 3. Psych:  Depressed affect, responds appropriately    ASSESSMENT AND PLAN

## 2013-03-07 NOTE — Patient Instructions (Signed)
Your physician recommends that you schedule a follow-up appointment in: 3 months  Your physician recommends that you return for lab work in: TODAY ([place])

## 2013-03-07 NOTE — Assessment & Plan Note (Signed)
Will check labs to evaluate need for more hydration. She continue to have chronic diarrhea. Will defer to PCP for treatment. Taken off of diphenhydramine.

## 2013-03-07 NOTE — Progress Notes (Deleted)
Name: Kaitlyn Cooper    DOB: 04/21/1926  Age: 80 y.o.  MR#: [id number]       PCP:  Sallee Lange, MD      Insurance: Payor: MEDICARE / Plan: MEDICARE PART A AND B / Product Type: *No Product type* /   CC:    Chief Complaint  Patient presents with  . Bradycardia    S/P PPM    VS Filed Vitals:   03/07/13 1507 03/07/13 1524 03/07/13 1525 03/07/13 1526  BP: 116/65 105/56 93/48 111/66  Pulse: 66 81 88 70  Height: 5\' 3"  (1.6 m)     Weight: 98 lb (44.453 kg)     SpO2: 100%       Weights Current Weight  03/07/13 98 lb (44.453 kg)  03/03/13 96 lb 8 oz (43.772 kg)  02/15/13 103 lb 9.9 oz (47 kg)    Blood Pressure  BP Readings from Last 3 Encounters:  03/07/13 111/66  03/04/13 99/63  03/03/13 90/46     Admit date:  (Not on file) Last encounter with RMR:  Visit date not found   Allergy Penicillins  Current Outpatient Prescriptions  Medication Sig Dispense Refill  . HYDROcodone-acetaminophen (NORCO) 5-325 MG per tablet Take 1 tablet by mouth every 4 (four) hours as needed for moderate pain. *MAX APAP-3 GR/24 HOURS FROM ALL SOURCES*  120 tablet  0  . megestrol (MEGACE) 400 MG/10ML suspension Take 10 mLs (400 mg total) by mouth 2 (two) times daily.  240 mL  3   No current facility-administered medications for this visit.    Discontinued Meds:    Medications Discontinued During This Encounter  Medication Reason  . diphenhydrAMINE (SOMINEX) 25 MG tablet Error    Patient Active Problem List   Diagnosis Date Noted  . Iron deficiency anemia 03/04/2013  . Dysphagia, unspecified(787.20) 03/03/2013  . Loss of weight 03/03/2013  . Fever, unspecified 02/20/2013  . Cough 02/20/2013  . Acute renal failure 02/11/2013  . Chronic diarrhea 02/10/2013  . Wedge compression fracture of T9 vertebra 02/10/2013  . Dehydration 02/10/2013  . ARF (acute renal failure) 02/10/2013  . Gastroenteritis 02/09/2013  . Weakness 02/03/2013  . Pernicious anemia 05/17/2012  . Esophageal stricture  03/16/2012  . Family hx of colon cancer 03/16/2012  . ESSENTIAL HYPERTENSION, BENIGN 04/25/2009  . PPM-St.Jude 04/25/2009  . COLONIC POLYPS, ADENOMATOUS 03/07/2009  . DYSPHAGIA 03/07/2009  . DEPRESSION 03/06/2009  . GASTROESOPHAGEAL REFLUX DISEASE, CHRONIC 03/06/2009  . OSTEOPOROSIS 03/06/2009  . OSTEOPENIA 03/06/2009  . DIARRHEA, CHRONIC 03/06/2009  . COLONIC POLYPS, HX OF 03/06/2009  . BRADYCARDIA 01/17/2009  . HYPOTHYROIDISM 10/24/2008  . HYPERLIPIDEMIA 10/24/2008  . ANEMIA 10/24/2008  . ATHEROSCLEROTIC CARDIOVASCULAR DISEASE 10/24/2008  . DEPRESSION, HX OF 10/24/2008    LABS    Component Value Date/Time   NA 138 02/15/2013 0513   NA 137 02/13/2013 0730   NA 143 02/11/2013 0550   K 3.4* 02/15/2013 0513   K 3.2* 02/13/2013 0730   K 3.8 02/11/2013 0550   CL 105 02/15/2013 0513   CL 105 02/13/2013 0730   CL 113* 02/11/2013 0550   CO2 21 02/15/2013 0513   CO2 16* 02/13/2013 0730   CO2 16* 02/11/2013 0550   GLUCOSE 86 02/15/2013 0513   GLUCOSE 101* 02/13/2013 0730   GLUCOSE 78 02/11/2013 0550   BUN 17 02/15/2013 0513   BUN 14 02/13/2013 0730   BUN 18 02/11/2013 0550   CREATININE 0.74 02/15/2013 0513   CREATININE 0.79 02/13/2013 0730   CREATININE  0.92 02/11/2013 0550   CREATININE 1.19* 11/25/2012 0822   CREATININE 1.05 05/27/2012 1553   CREATININE 1.19 08/29/2008 0934   CALCIUM 8.7 02/15/2013 0513   CALCIUM 8.5 02/13/2013 0730   CALCIUM 8.3* 02/11/2013 0550   GFRNONAA 76* 02/15/2013 0513   GFRNONAA 74* 02/13/2013 0730   GFRNONAA 56* 02/11/2013 0550   GFRAA 88* 02/15/2013 0513   GFRAA 86* 02/13/2013 0730   GFRAA 64* 02/11/2013 0550   CMP     Component Value Date/Time   NA 138 02/15/2013 0513   K 3.4* 02/15/2013 0513   CL 105 02/15/2013 0513   CO2 21 02/15/2013 0513   GLUCOSE 86 02/15/2013 0513   BUN 17 02/15/2013 0513   CREATININE 0.74 02/15/2013 0513   CREATININE 1.19* 11/25/2012 0822   CREATININE 1.19 08/29/2008 0934   CALCIUM 8.7 02/15/2013 0513   PROT  7.1 11/25/2012 0822   ALBUMIN 2.7* 02/15/2013 0513   AST 17 11/25/2012 0822   ALT 9 11/25/2012 0822   ALKPHOS 44 11/25/2012 0822   BILITOT 0.4 11/25/2012 0822   GFRNONAA 76* 02/15/2013 0513   GFRAA 88* 02/15/2013 0513       Component Value Date/Time   WBC 8.2 03/04/2013 1455   WBC 12.0* 02/15/2013 0513   WBC 10.3 02/10/2013 0516   HGB 8.2* 03/04/2013 1455   HGB 8.6* 02/15/2013 0513   HGB 10.1* 02/10/2013 0516   HGB 11.0* 02/01/2013 1039   HCT 25.7* 03/04/2013 1455   HCT 26.9* 02/15/2013 0513   HCT 29.4* 02/10/2013 0516   MCV 95.5 03/04/2013 1455   MCV 92.4 02/15/2013 0513   MCV 93.3 02/10/2013 0516    Lipid Panel     Component Value Date/Time   CHOL 191 11/25/2012 0822   TRIG 156* 11/25/2012 0822   HDL 57 11/25/2012 0822   CHOLHDL 3.4 11/25/2012 0822   VLDL 31 11/25/2012 0822   LDLCALC 103* 11/25/2012 0822    ABG No results found for this basename: phart, pco2, pco2art, po2, po2art, hco3, tco2, acidbasedef, o2sat     Lab Results  Component Value Date   TSH 0.398 02/11/2013   BNP (last 3 results) No results found for this basename: PROBNP,  in the last 8760 hours Cardiac Panel (last 3 results) No results found for this basename: CKTOTAL, CKMB, TROPONINI, RELINDX,  in the last 72 hours  Iron/TIBC/Ferritin    Component Value Date/Time   IRON 32* 03/04/2013 1455   TIBC 225* 03/04/2013 1455   FERRITIN 82 03/04/2013 1455     EKG Orders placed during the hospital encounter of 02/09/13  . EKG 12-LEAD  . EKG 12-LEAD  . EKG     Prior Assessment and Plan Problem List as of 03/07/2013   COLONIC POLYPS, ADENOMATOUS   HYPOTHYROIDISM   HYPERLIPIDEMIA   Last Assessment & Plan   06/04/2010 Office Visit Written 06/04/2010  9:07 PM by Evans Lance, MD     Continue a low fat diet and statin therapy.    ANEMIA   DEPRESSION   ESSENTIAL HYPERTENSION, BENIGN   Last Assessment & Plan   07/19/2012 Office Visit Edited 07/19/2012  9:50 AM by Evans Lance, MD     Her blood pressure is  well controlled. Will continue current medical therapy.     BRADYCARDIA   Last Assessment & Plan   05/27/2012 Office Visit Written 05/27/2012  3:47 PM by Lendon Colonel, NP     A St. Jude's pacemaker in situ. She is due to have  her pacemaker interrogated with office visit with Dr. Lovena Le for annual evaluation. Limit will be made prior to her leaving the office.    ATHEROSCLEROTIC CARDIOVASCULAR DISEASE   GASTROESOPHAGEAL REFLUX DISEASE, CHRONIC   OSTEOPOROSIS   OSTEOPENIA   DYSPHAGIA   DIARRHEA, CHRONIC   Last Assessment & Plan   05/27/2012 Office Visit Written 05/27/2012  3:48 PM by Lendon Colonel, NP     Recommend followup with primary care physician or GI in continued symptoms to avoid dehydration.    DEPRESSION, HX OF   COLONIC POLYPS, HX OF   PPM-St.Jude   Last Assessment & Plan   07/19/2012 Office Visit Written 07/19/2012  9:51 AM by Evans Lance, MD     Her St. Jude DDD PPM is working normally. Will recheck in several months.    Esophageal stricture   Family hx of colon cancer   Pernicious anemia   Weakness   Gastroenteritis   Chronic diarrhea   Wedge compression fracture of T9 vertebra   Dehydration   ARF (acute renal failure)   Acute renal failure   Fever, unspecified   Cough   Dysphagia, unspecified(787.20)   Loss of weight   Iron deficiency anemia       Imaging: Dg Chest 2 View  02/18/2013   CLINICAL DATA:  Congestion, cough, fever  EXAM: CHEST  2 VIEW  COMPARISON:  02/09/2013, 04/14/2012  FINDINGS: Mild cardiac enlargement. Two lead cardiac pacer with generator over left thorax. Vascular pattern is normal. No infiltrate or consolidation. 4 mm calcified nodule laterally in the left upper lobe, stable and most consistent with a granuloma. Moderate hiatal hernia identified. Moderately severe thoracic compression deformity mid thoracic spine new from 04/14/2012. It appears stable from 02/09/2013. Pleural apical thickening is stable bilaterally.  IMPRESSION: Hiatal  hernia. No acute cardiopulmonary process. Compression deformity thoracic spine.   Electronically Signed   By: Skipper Cliche M.D.   On: 02/18/2013 20:06   Dg Thoracic Spine 2 View  02/09/2013   CLINICAL DATA:  Back pain post fall today  EXAM: THORACIC SPINE - 2 VIEW  COMPARISON:  Chest radiographs 220 T6 2014  FINDINGS: Twelve pairs of ribs.  Diffuse osseous demineralization.  Dextro convex thoracolumbar scoliosis apex T11/T12.  Marked compression deformity of the inferior endplate of T9 demonstrating approximately 50% anterior height loss, new since previous exam.  No additional fracture or dislocation identified.  Calcified AP window and left hilar adenopathy.  Extensive atherosclerotic calcification aorta.  Biapical pleural parenchymal lung scarring unchanged.  IMPRESSION: Osseous demineralization with new inferior endplate compression fracture of T9 vertebral body since 04/14/2012, demonstrating approximately 50% anterior height loss.   Electronically Signed   By: Lavonia Dana M.D.   On: 02/09/2013 16:41   Dg Lumbar Spine Complete  02/14/2013   CLINICAL DATA:  Lower extremity weakness, fall.  EXAM: LUMBAR SPINE - COMPLETE 4+ VIEW  COMPARISON:  February 09, 2013.  FINDINGS: Stable mild levoscoliosis of lumbar spine is noted. Status post cholecystectomy. Residual contrast is noted in the colon. Atherosclerotic calcifications of abdominal aorta are noted. Diffuse osteopenia is noted. No fracture or spondylolisthesis is noted. Degenerative disc disease is noted at L1-2 with anterior osteophyte formation. Mild anterior osteophyte formation is also noted at L2-3.  IMPRESSION: Severe degenerative disc disease is noted at L1-2. Diffuse osteopenia is noted. No acute abnormality seen in the lumbar spine.   Electronically Signed   By: Sabino Dick M.D.   On: 02/14/2013 14:25   Dg Lumbar  Spine Complete  02/09/2013   CLINICAL DATA:  Back pain post fall today  EXAM: LUMBAR SPINE - COMPLETE 4+ VIEW  COMPARISON:   03/07/2005  FINDINGS: Five non-rib-bearing lumbar vertebrae.  Levoconvex lumbar scoliosis apex L2.  Scattered facet degenerative changes.  Height loss of the T9 vertebral body as reported on thoracic radiographs.  Disc space narrowing with endplate spur formation at L1-L2 and L2-L3.  No lumbar fracture or subluxation.  No spondylolysis.  Extensive atherosclerotic calcification of the aorta, iliac arteries and abdominal branch vessels.  SI joints symmetric.  IMPRESSION: Osseous demineralization with degenerative disc and facet disease changes of the lumbar spine with associated levoconvex lumbar scoliosis.  No acute abnormalities.   Electronically Signed   By: Lavonia Dana M.D.   On: 02/09/2013 16:43   Ct Thoracic Spine Wo Contrast  02/11/2013   CLINICAL DATA:  80 year old female with back pain after fall. Thoracic fracture on radiographs. Initial encounter.  EXAM: CT THORACIC SPINE WITHOUT CONTRAST  TECHNIQUE: Multidetector CT imaging of the thoracic spine was performed without intravenous contrast administration. Multiplanar CT image reconstructions were also generated.  COMPARISON:  Chest radiographs 02/09/2013 and earlier.  FINDINGS: Left chest cardiac pacemaker visible on the scout view.  CT images include the head thoracic levels T4 (only the inferior portion of the vertebral body) through T12 (in the superior endplate of L1), with normal thoracic and lumbar segmentation demonstrated between the 01/03/2010 comparison and the scout view today.  Osteopenia. There is a comminuted T9 vertebral body fracture with retropulsion of a posterior inferior endplate fracture fragment (series 6, image 15). AP spinal canal dimension decreased to 9-11 mm (versus 14-15 mm at unaffected levels). The pedicles appeared to remain intact, although there may be a nondisplaced fracture of the right T9 transverse process. Loss of height up to 35%. Visible T9 ribs appear intact.  Other visible thoracic levels and posterior ribs  appear intact. There are bilateral layering pleural effusions which appear to be small. Calcified left hilar and mediastinal lymphadenopathy partially visible. Confluent apical pulmonary opacity, favor scarring but incompletely visible. Negative visualized upper abdominal viscera.  IMPRESSION: 1. Acute to subacute appearing comminuted T9 vertebral body fracture with 35% loss of height and posterior retropulsion of the posterior inferior endplate fragment producing the AP spinal canal by 30%. Osteopenia. Pedicles appear to remain intact although there may be an associated nondisplaced fracture of the right T9 transverse process.  2.  Small layering pleural effusions.   Electronically Signed   By: Lars Pinks M.D.   On: 02/11/2013 11:15   Dg Esophagus  02/11/2013   CLINICAL DATA:  Dysphagia, feels like food getting stuck in upper to mid chest, past history of esophageal stricture and dilatation  EXAM: ESOPHOGRAM/BARIUM SWALLOW  TECHNIQUE: Single contrast examination was performed using thin barium. Patient also swallowed a 12.5 mm diameter barium tablet.  COMPARISON:  None  FLUOROSCOPY TIME:  2 min 30 seconds  FINDINGS: Severe diffuse impairment of esophageal motility.  Poor clearance of tracer by the primary peristaltic waves is identified.  Numerous secondary and tertiary waves are seen.  Intermittent retrograde peristalsis.  Tiny Zenker diverticulum.  Additional diverticula are seen at the mid to distal thoracic esophagus.  Wall appears smooth without irregularity or ulceration.  Moderate sized hiatal hernia identified.  12.5 mm diameter barium tablet obstructed at the distal esophagus several cm proximal to the gastroesophageal junction and could not pass despite multiple swallows of barium and water.  No persistent intraluminal filling defects.  Targeted rapid sequence imaging of the cervical esophagus and hypopharynx demonstrated no laryngeal penetration or aspiration.  Piriform sinus residuals were noted  unilaterally, seen only on lateral view, likely on right.  IMPRESSION: Significant diffuse impairment of esophageal motility; clearance of contrast from the esophagus is facilitated by upright positioning and gravity.  Tiny Zenker diverticulum.  Multiple mid to distal thoracic esophageal diverticula.  Moderate-sized hiatal hernia.  Obstruction of a 12.5 mm diameter barium tablet several cm proximal to the GE junction compatible with stricture.   Electronically Signed   By: Lavonia Dana M.D.   On: 02/11/2013 13:04

## 2013-03-07 NOTE — Assessment & Plan Note (Signed)
CBC is ordered for status

## 2013-03-08 ENCOUNTER — Non-Acute Institutional Stay (SKILLED_NURSING_FACILITY): Payer: Medicare Other | Admitting: Internal Medicine

## 2013-03-08 DIAGNOSIS — D649 Anemia, unspecified: Secondary | ICD-10-CM

## 2013-03-08 DIAGNOSIS — S22070A Wedge compression fracture of T9-T10 vertebra, initial encounter for closed fracture: Secondary | ICD-10-CM

## 2013-03-08 DIAGNOSIS — E875 Hyperkalemia: Secondary | ICD-10-CM

## 2013-03-08 DIAGNOSIS — S22009A Unspecified fracture of unspecified thoracic vertebra, initial encounter for closed fracture: Secondary | ICD-10-CM

## 2013-03-08 NOTE — Progress Notes (Signed)
Patient ID: Kaitlyn Cooper, female   DOB: [birth year], 80 y.o.   MRN: [id number]   This is an acute visit.  Level of care skilled.  Facility [facility].  Chief complaint-acute visit secondary to hyperkalemia? Back pain.  History of present illness.  Patient is a very pleasant 80 year old female-he suffered a fall and developed mid back pain x-rays showed T9 compression fracture as well as L1-L2 degenerative disc disease.  She was also noted to have chronic diarrhea in the setting of irritable bowel syndrome-she continues on Imodium.  She is receiving OxyContin controlled release 10 mg in the morning as well as hydrocodone 5-325 mg  every 4 hours when necessary apparently she takes this fairly frequently at night.  It appears she would benefit from possibly OxyContin twice a day  Of note a lab was also done today which shows a potassium of 5.6-her creatinine is 1.1 BUN 26 sodium 140.  Per chart review of previous metabolic panels I do not see any history of hyperkalemia I do not see this listed in her medical history either-I do not see any meds which may contribute to this such as ACE inhibitors or potassium supplementation.  She also has a history of anemia hemoglobin is 8.4 this is followed by oncology hematology and she actually has an iron infusion scheduled in 2 days.  Family medical social history has been reviewed per admission note on 02/16/2013.  Medications have been reviewed per MAR.  Review of systems.  General denies any fever or chills.  She has lost weight apparently is eating a bit better she is now on Megace this has been an issue since her stay here  Respiratory no complaints of shortness of breath or cough.  Cardiac no chest pain.  GI does have a history of irritable bowel syndrome has intermittent diarrhea apparently this has improved somewhat although they're still bouts of it.  Muscle skeletal does have significant back pain as stated above takes or when  necessary hydrocodone apparently quite frequently later in the day.  Neurologic does not complaining of any numbness dizziness or headache.  Physical exam.  Temperature 97.8 pulse 81 respirations 20 blood pressure 147/61-126/61 in this range weight is 98.2 this appears to be loss of about 6 pounds since her admission late last month.  In general this is a pleasant frail-appearing woman in no distress lying comfortably in bed.  Her skin is warm and dry.  Chest is clear to auscultation no labored breathing.  Heart is regular rate and rhythm with occasional irregular beats there is no lower extremity edema of significance.  Her abdomen soft nontender positive bowel sounds.  Muscle skeletal I do not note any deformities moves all extremities at baseline--does have some tenderness to palpation of her back at times.  Neurologic-appears to be grossly intact her speech is clear no lateralizing findings does have a history of lower extremity weakness.  Psych she is alert and oriented pleasant and appropriate labs.  03/08/2013.  WBC 8.6 hemoglobin 8.4 platelets 384.  Sodium 140 potassium 5.6 BUN 26 creatinine 1.10.  Assessment and plan.  #1-hyperkalemia-I do not really see an etiology for this order an updated BMP to ensure the accuracy of this-clinically she appears stable.  #2-history of compression fractures degenerative disc disease status she is on OxyContin controlled release 10 mg in the morning and hydrocodone 5-325 mg throughout the day apparently she is taking this fairly frequently at night-will change this to OxyContin 10 mg controlled release twice a  day to see if this will give her some more consistent relief continue that when necessary hydrocodone as needed.  #3-anemia-this is most likely chronic diseasee is followed by hematology clinically appears stable she is receiving an iron infusion on January 22--her hemoglobin lately has run basically in the mid  [place] note greater than 25 minutes spent assessing patient-discussing her concerns at bedside as well as with nursing staff-and coordinating and formulating a plan of care-of note greater than 50% of time spent coordinating  plan of care

## 2013-03-09 ENCOUNTER — Non-Acute Institutional Stay (SKILLED_NURSING_FACILITY): Payer: Medicare Other | Admitting: Internal Medicine

## 2013-03-09 ENCOUNTER — Telehealth (INDEPENDENT_AMBULATORY_CARE_PROVIDER_SITE_OTHER): Payer: Self-pay | Admitting: Internal Medicine

## 2013-03-09 DIAGNOSIS — S22000A Wedge compression fracture of unspecified thoracic vertebra, initial encounter for closed fracture: Secondary | ICD-10-CM

## 2013-03-09 DIAGNOSIS — D649 Anemia, unspecified: Secondary | ICD-10-CM

## 2013-03-09 DIAGNOSIS — S22009A Unspecified fracture of unspecified thoracic vertebra, initial encounter for closed fracture: Secondary | ICD-10-CM

## 2013-03-09 DIAGNOSIS — S22070A Wedge compression fracture of T9-T10 vertebra, initial encounter for closed fracture: Secondary | ICD-10-CM

## 2013-03-09 DIAGNOSIS — I951 Orthostatic hypotension: Secondary | ICD-10-CM

## 2013-03-09 NOTE — Progress Notes (Signed)
Patient ID: Kaitlyn Cooper, female   DOB: 1926/03/15, 80 y.o.   MRN: [id number] [facility] Chief complaint; followup severe postural hypotension History; this is a pleasant but frail woman that I admitted to the facility in late December. She had a fall with a T9 compression fracture. She was noted in the hospital to have orthostatic hypotension. She had a myasthenia panel, B12 level was normal. She is not a diabetic. She had a borderline cortisol level however I have done an ACTH stimulation test which was normal which excludes adrenal insufficiency. She continues to have severe postural symptoms. She is followed with hematology about her chronic anemia they are now labeling this is iron deficiency. I believe she is also seeing cardiology. Her blood pressure this morning via the nurses is 171systolic  Lab work from yesterday shows a sodium of 139 a potassium of 5 BUN of 30 and a creatinine of 1.13. Her hemoglobin is 8.4 with normochromic normocytic indices white count 8.6 platelet count 384  Review of systems Cardiac she does not describe chest pain or palpitations Respiratory no cough no sputum  Physical examination Gen. the patient is frail but responsive for. Vitals blood pressure 160/60 pulse 64 supine she drops down to 60/? Pulse 72 standing she is clearly exceptionally symptomatic she really cannot maintain a standing posture for anything more than 30-60 seconds  respiratory; clear entry bilaterally Cardiac heart sounds are normal no murmurs she is not dehydrated  Impression/plan #1 severe orthostatic hypotension. The reason behind this is not clear. She is anemic and may be somewhat D. had but I don't think either of these together her the reason for her problem. There is a tendency towards orthostatic hypotension as part of normal aging process however I think as far exceeds what would be acceptable for normal aging. She could have central or peripheral he mediated  sympathetic autonomic nervous system dysfunction however there is no obvious manifestations of the former. I have discussed in great detail with the patient the risks benefits of starting her on medication to try improper blood pressure up to. For at least today she has elected to proceed with treatment. I'm going to go ahead and start her on Midrin I will check this myself next Monday at which time she is supposed to go home #2hypothroidism on replacement. Do not see that this has been checked in our chart we'll check this on [place] link #3 T9 compression fracture she is still getting regular oxycodone all change this to when necessary #4 chronic renal insufficiency this is very mild #5 esophageal stricture I didn't get the sense that this is active  The patient is heading home with her sister. This will be a fairly significant amount of care that this woman requires. I'm going to start her on Midrin and no followup on this myself

## 2013-03-09 NOTE — Telephone Encounter (Signed)
Rx for Ismo called to pharmacy. [phone]

## 2013-03-10 ENCOUNTER — Telehealth (INDEPENDENT_AMBULATORY_CARE_PROVIDER_SITE_OTHER): Payer: Self-pay | Admitting: *Deleted

## 2013-03-10 ENCOUNTER — Encounter (HOSPITAL_BASED_OUTPATIENT_CLINIC_OR_DEPARTMENT_OTHER): Payer: Medicare Other

## 2013-03-10 VITALS — BP 131/51 | HR 65 | Temp 95.4°F | Resp 16

## 2013-03-10 DIAGNOSIS — D509 Iron deficiency anemia, unspecified: Secondary | ICD-10-CM

## 2013-03-10 MED ORDER — SODIUM CHLORIDE 0.9 % IV SOLN
1020.0000 mg | Freq: Once | INTRAVENOUS | Status: AC
  Administered 2013-03-10: 1020 mg via INTRAVENOUS
  Filled 2013-03-10: qty 34

## 2013-03-10 MED ORDER — SODIUM CHLORIDE 0.9 % IJ SOLN: 10.0000 mL | INTRAMUSCULAR | Status: DC | PRN

## 2013-03-10 MED ORDER — SODIUM CHLORIDE 0.9 % IV SOLN
Freq: Once | INTRAVENOUS | Status: AC
  Administered 2013-03-10: 11:00:00 via INTRAVENOUS

## 2013-03-10 NOTE — Telephone Encounter (Signed)
Dr.Rehman I called [facility] and her nurse is at lunch,will return at 12:20 pm. I was told by the secretary that there was no other information to give,other than the Ismo had caused the patient to have drops in her blood pressure. They wanted to make you aware of this and get you recommendation. Call back number is [phone]. I paged you @ 12:05 pm.

## 2013-03-10 NOTE — Progress Notes (Signed)
Tolerated well

## 2013-03-10 NOTE — Telephone Encounter (Addendum)
Per Delia at [facility] ([facility]), Dr. Laural Golden put Kaitlyn Cooper on Ismo last night and they are having problems with her BP dropping already. Please return the call at [phone].

## 2013-03-10 NOTE — Telephone Encounter (Signed)
I called Delia at [facility] I gave her the orders per Dr.Rehman to [place].

## 2013-03-10 NOTE — Telephone Encounter (Signed)
Kaitlyn Cooper. Patient needs to be on mechanical soft diet.

## 2013-03-16 ENCOUNTER — Ambulatory Visit: Payer: Medicare Other | Admitting: Family Medicine

## 2013-03-18 ENCOUNTER — Inpatient Hospital Stay (HOSPITAL_COMMUNITY)
Admission: EM | Admit: 2013-03-18 | Discharge: 2013-03-25 | DRG: 643 | Disposition: A | Discharge status: Home Health | Payer: Medicare Other | Attending: Internal Medicine | Admitting: Internal Medicine

## 2013-03-18 ENCOUNTER — Encounter (HOSPITAL_COMMUNITY): Payer: Self-pay | Admitting: Emergency Medicine

## 2013-03-18 ENCOUNTER — Other Ambulatory Visit: Payer: Self-pay

## 2013-03-18 ENCOUNTER — Emergency Department (HOSPITAL_COMMUNITY): Payer: Medicare Other

## 2013-03-18 ENCOUNTER — Telehealth (INDEPENDENT_AMBULATORY_CARE_PROVIDER_SITE_OTHER): Payer: Self-pay | Admitting: *Deleted

## 2013-03-18 DIAGNOSIS — E785 Hyperlipidemia, unspecified: Secondary | ICD-10-CM

## 2013-03-18 DIAGNOSIS — Z950 Presence of cardiac pacemaker: Secondary | ICD-10-CM

## 2013-03-18 DIAGNOSIS — K222 Esophageal obstruction: Secondary | ICD-10-CM

## 2013-03-18 DIAGNOSIS — E2740 Unspecified adrenocortical insufficiency: Secondary | ICD-10-CM

## 2013-03-18 DIAGNOSIS — R5383 Other fatigue: Secondary | ICD-10-CM

## 2013-03-18 DIAGNOSIS — D509 Iron deficiency anemia, unspecified: Secondary | ICD-10-CM

## 2013-03-18 DIAGNOSIS — M899 Disorder of bone, unspecified: Secondary | ICD-10-CM

## 2013-03-18 DIAGNOSIS — Z681 Body mass index (BMI) 19 or less, adult: Secondary | ICD-10-CM

## 2013-03-18 DIAGNOSIS — E875 Hyperkalemia: Secondary | ICD-10-CM

## 2013-03-18 DIAGNOSIS — D72829 Elevated white blood cell count, unspecified: Secondary | ICD-10-CM

## 2013-03-18 DIAGNOSIS — I2510 Atherosclerotic heart disease of native coronary artery without angina pectoris: Secondary | ICD-10-CM

## 2013-03-18 DIAGNOSIS — IMO0002 Reserved for concepts with insufficient information to code with codable children: Secondary | ICD-10-CM

## 2013-03-18 DIAGNOSIS — I498 Other specified cardiac arrhythmias: Secondary | ICD-10-CM

## 2013-03-18 DIAGNOSIS — E860 Dehydration: Secondary | ICD-10-CM

## 2013-03-18 DIAGNOSIS — Z66 Do not resuscitate: Secondary | ICD-10-CM | POA: Diagnosis present

## 2013-03-18 DIAGNOSIS — M949 Disorder of cartilage, unspecified: Secondary | ICD-10-CM

## 2013-03-18 DIAGNOSIS — R1319 Other dysphagia: Secondary | ICD-10-CM

## 2013-03-18 DIAGNOSIS — F329 Major depressive disorder, single episode, unspecified: Secondary | ICD-10-CM

## 2013-03-18 DIAGNOSIS — Z860100 Personal history of colon polyps, unspecified: Secondary | ICD-10-CM

## 2013-03-18 DIAGNOSIS — E2749 Other adrenocortical insufficiency: Principal | ICD-10-CM

## 2013-03-18 DIAGNOSIS — K219 Gastro-esophageal reflux disease without esophagitis: Secondary | ICD-10-CM

## 2013-03-18 DIAGNOSIS — F3289 Other specified depressive episodes: Secondary | ICD-10-CM

## 2013-03-18 DIAGNOSIS — D126 Benign neoplasm of colon, unspecified: Secondary | ICD-10-CM

## 2013-03-18 DIAGNOSIS — E039 Hypothyroidism, unspecified: Secondary | ICD-10-CM

## 2013-03-18 DIAGNOSIS — Z800 Family history of malignant neoplasm of digestive organs: Secondary | ICD-10-CM

## 2013-03-18 DIAGNOSIS — Z87891 Personal history of nicotine dependence: Secondary | ICD-10-CM

## 2013-03-18 DIAGNOSIS — N179 Acute kidney failure, unspecified: Secondary | ICD-10-CM

## 2013-03-18 DIAGNOSIS — H8110 Benign paroxysmal vertigo, unspecified ear: Secondary | ICD-10-CM

## 2013-03-18 DIAGNOSIS — R509 Fever, unspecified: Secondary | ICD-10-CM

## 2013-03-18 DIAGNOSIS — R5381 Other malaise: Secondary | ICD-10-CM

## 2013-03-18 DIAGNOSIS — D510 Vitamin B12 deficiency anemia due to intrinsic factor deficiency: Secondary | ICD-10-CM

## 2013-03-18 DIAGNOSIS — R197 Diarrhea, unspecified: Secondary | ICD-10-CM

## 2013-03-18 DIAGNOSIS — I951 Orthostatic hypotension: Secondary | ICD-10-CM

## 2013-03-18 DIAGNOSIS — E43 Unspecified severe protein-calorie malnutrition: Secondary | ICD-10-CM

## 2013-03-18 DIAGNOSIS — R1310 Dysphagia, unspecified: Secondary | ICD-10-CM

## 2013-03-18 DIAGNOSIS — M810 Age-related osteoporosis without current pathological fracture: Secondary | ICD-10-CM

## 2013-03-18 DIAGNOSIS — R634 Abnormal weight loss: Secondary | ICD-10-CM

## 2013-03-18 DIAGNOSIS — R531 Weakness: Secondary | ICD-10-CM

## 2013-03-18 DIAGNOSIS — K5900 Constipation, unspecified: Secondary | ICD-10-CM | POA: Diagnosis present

## 2013-03-18 DIAGNOSIS — E861 Hypovolemia: Secondary | ICD-10-CM | POA: Diagnosis present

## 2013-03-18 DIAGNOSIS — S22070A Wedge compression fracture of T9-T10 vertebra, initial encounter for closed fracture: Secondary | ICD-10-CM

## 2013-03-18 DIAGNOSIS — D649 Anemia, unspecified: Secondary | ICD-10-CM | POA: Diagnosis present

## 2013-03-18 DIAGNOSIS — K529 Noninfective gastroenteritis and colitis, unspecified: Secondary | ICD-10-CM

## 2013-03-18 DIAGNOSIS — Z79899 Other long term (current) drug therapy: Secondary | ICD-10-CM

## 2013-03-18 HISTORY — DX: Unspecified adrenocortical insufficiency: E27.40

## 2013-03-18 HISTORY — DX: Orthostatic hypotension: I95.1

## 2013-03-18 HISTORY — DX: Presence of cardiac pacemaker: Z95.0

## 2013-03-18 LAB — COMPREHENSIVE METABOLIC PANEL
ALT: 9 U/L (ref 0–35)
AST: 19 U/L (ref 0–37)
Albumin: 3.5 g/dL (ref 3.5–5.2)
Alkaline Phosphatase: 90 U/L (ref 39–117)
BILIRUBIN TOTAL: 0.3 mg/dL (ref 0.3–1.2)
BUN: 35 mg/dL — ABNORMAL HIGH (ref 6–23)
CALCIUM: 9.7 mg/dL (ref 8.4–10.5)
CO2: 21 meq/L (ref 19–32)
CREATININE: 1.19 mg/dL — AB (ref 0.50–1.10)
Chloride: 103 mEq/L (ref 96–112)
GFR calc Af Amer: 47 mL/min — ABNORMAL LOW (ref >90)
GFR, EST NON AFRICAN AMERICAN: 41 mL/min — AB (ref >90)
Glucose, Bld: 99 mg/dL (ref 70–99)
Potassium: 4.2 mEq/L (ref 3.7–5.3)
Sodium: 141 mEq/L (ref 137–147)
Total Protein: 8.3 g/dL (ref 6.0–8.3)

## 2013-03-18 LAB — URINALYSIS, ROUTINE W REFLEX MICROSCOPIC
Bilirubin Urine: NEGATIVE
GLUCOSE, UA: NEGATIVE mg/dL
HGB URINE DIPSTICK: NEGATIVE
Ketones, ur: NEGATIVE mg/dL
Leukocytes, UA: NEGATIVE
Nitrite: NEGATIVE
PROTEIN: NEGATIVE mg/dL
Specific Gravity, Urine: >1.03 — ABNORMAL HIGH (ref 1.005–1.030)
Urobilinogen, UA: 0.2 mg/dL (ref 0.0–1.0)
pH: 5.5 (ref 5.0–8.0)

## 2013-03-18 LAB — CBC WITH DIFFERENTIAL/PLATELET
Basophils Absolute: 0 10*3/uL (ref 0.0–0.1)
Basophils Relative: 0 % (ref 0–1)
Eosinophils Absolute: 0.2 10*3/uL (ref 0.0–0.7)
Eosinophils Relative: 1 % (ref 0–5)
HEMATOCRIT: 33.9 % — AB (ref 36.0–46.0)
Hemoglobin: 10.8 g/dL — ABNORMAL LOW (ref 12.0–15.0)
Lymphocytes Relative: 27 % (ref 12–46)
Lymphs Abs: 3.5 10*3/uL (ref 0.7–4.0)
MCH: 29.8 pg (ref 26.0–34.0)
MCHC: 31.9 g/dL (ref 30.0–36.0)
MCV: 93.6 fL (ref 78.0–100.0)
MONO ABS: 1.3 10*3/uL — AB (ref 0.1–1.0)
Monocytes Relative: 10 % (ref 3–12)
Neutro Abs: 8 10*3/uL — ABNORMAL HIGH (ref 1.7–7.7)
Neutrophils Relative %: 61 % (ref 43–77)
Platelets: 344 10*3/uL (ref 150–400)
RBC: 3.62 MIL/uL — ABNORMAL LOW (ref 3.87–5.11)
RDW: 15.5 % (ref 11.5–15.5)
WBC: 13 10*3/uL — AB (ref 4.0–10.5)

## 2013-03-18 LAB — TROPONIN I: Troponin I: <0.3 ng/mL (ref <0.30)

## 2013-03-18 MED ORDER — BISACODYL 10 MG RE SUPP
10.0000 mg | Freq: Every day | RECTAL | Status: DC | PRN
  Administered 2013-03-19: 10 mg via RECTAL
  Filled 2013-03-18: qty 1

## 2013-03-18 MED ORDER — ENOXAPARIN SODIUM 30 MG/0.3ML ~~LOC~~ SOLN
30.0000 mg | SUBCUTANEOUS | Status: DC
  Administered 2013-03-18 – 2013-03-24 (×7): 30 mg via SUBCUTANEOUS
  Filled 2013-03-18 (×7): qty 0.3

## 2013-03-18 MED ORDER — BOOST PLUS PO LIQD
237.0000 mL | Freq: Three times a day (TID) | ORAL | Status: DC
  Filled 2013-03-18 (×8): qty 237

## 2013-03-18 MED ORDER — ONDANSETRON HCL 4 MG/2ML IJ SOLN: 4.0000 mg | Freq: Four times a day (QID) | INTRAMUSCULAR | Status: DC | PRN

## 2013-03-18 MED ORDER — ALUM & MAG HYDROXIDE-SIMETH 200-200-20 MG/5ML PO SUSP: 30.0000 mL | Freq: Four times a day (QID) | ORAL | Status: DC | PRN

## 2013-03-18 MED ORDER — SENNA 8.6 MG PO TABS
1.0000 | ORAL_TABLET | Freq: Every day | ORAL | Status: DC
  Administered 2013-03-18 – 2013-03-24 (×5): 8.6 mg via ORAL
  Filled 2013-03-18 (×6): qty 1

## 2013-03-18 MED ORDER — MIDODRINE HCL 5 MG PO TABS
5.0000 mg | ORAL_TABLET | Freq: Three times a day (TID) | ORAL | Status: DC
  Administered 2013-03-19 – 2013-03-21 (×9): 5 mg via ORAL
  Filled 2013-03-18 (×14): qty 1

## 2013-03-18 MED ORDER — DOCUSATE SODIUM 100 MG PO CAPS
100.0000 mg | ORAL_CAPSULE | Freq: Every day | ORAL | Status: DC
  Administered 2013-03-19 – 2013-03-25 (×4): 100 mg via ORAL
  Filled 2013-03-18 (×6): qty 1

## 2013-03-18 MED ORDER — FAMOTIDINE 20 MG PO TABS
20.0000 mg | ORAL_TABLET | Freq: Every day | ORAL | Status: DC
  Administered 2013-03-19 – 2013-03-24 (×6): 20 mg via ORAL
  Filled 2013-03-18 (×6): qty 1

## 2013-03-18 MED ORDER — MEGESTROL ACETATE 40 MG PO TABS
40.0000 mg | ORAL_TABLET | Freq: Every day | ORAL | Status: DC
  Administered 2013-03-19 – 2013-03-22 (×4): 40 mg via ORAL
  Filled 2013-03-18 (×5): qty 1

## 2013-03-18 MED ORDER — ACETAMINOPHEN 325 MG PO TABS
650.0000 mg | ORAL_TABLET | Freq: Four times a day (QID) | ORAL | Status: DC | PRN
  Administered 2013-03-21: 650 mg via ORAL
  Filled 2013-03-18: qty 2

## 2013-03-18 MED ORDER — SODIUM CHLORIDE 0.9 % IV BOLUS (SEPSIS)
500.0000 mL | Freq: Once | INTRAVENOUS | Status: AC
  Administered 2013-03-18: 500 mL via INTRAVENOUS

## 2013-03-18 MED ORDER — CHOLECALCIFEROL 10 MCG (400 UNIT) PO TABS
400.0000 [IU] | ORAL_TABLET | Freq: Every day | ORAL | Status: DC
  Administered 2013-03-19 – 2013-03-25 (×7): 400 [IU] via ORAL
  Filled 2013-03-18 (×7): qty 1

## 2013-03-18 MED ORDER — SODIUM CHLORIDE 0.9 % IV SOLN: INTRAVENOUS | Status: DC

## 2013-03-18 MED ORDER — LEVOTHYROXINE SODIUM 75 MCG PO TABS
75.0000 ug | ORAL_TABLET | Freq: Every day | ORAL | Status: DC
  Administered 2013-03-19 – 2013-03-25 (×7): 75 ug via ORAL
  Filled 2013-03-18 (×7): qty 1

## 2013-03-18 MED ORDER — HYDROMORPHONE HCL PF 1 MG/ML IJ SOLN: 1.0000 mg | INTRAMUSCULAR | Status: DC | PRN

## 2013-03-18 MED ORDER — HYDROCODONE-ACETAMINOPHEN 5-325 MG PO TABS
1.0000 | ORAL_TABLET | ORAL | Status: DC | PRN
Start: 2013-03-18 — End: 2013-03-25
  Administered 2013-03-22 – 2013-03-24 (×7): 1 via ORAL
  Filled 2013-03-18 (×7): qty 1

## 2013-03-18 MED ORDER — OXYCODONE HCL 5 MG PO TABS: 5.0000 mg | ORAL_TABLET | Freq: Four times a day (QID) | ORAL | Status: DC | PRN

## 2013-03-18 MED ORDER — ACETAMINOPHEN 650 MG RE SUPP: 650.0000 mg | Freq: Four times a day (QID) | RECTAL | Status: DC | PRN

## 2013-03-18 MED ORDER — ONDANSETRON HCL 4 MG PO TABS
4.0000 mg | ORAL_TABLET | Freq: Four times a day (QID) | ORAL | Status: DC | PRN
Start: 2013-03-18 — End: 2013-03-25

## 2013-03-18 MED ORDER — SODIUM CHLORIDE 0.9 % IV SOLN
INTRAVENOUS | Status: DC
  Administered 2013-03-18: 22:00:00 via INTRAVENOUS

## 2013-03-18 MED ORDER — MAGNESIUM HYDROXIDE 400 MG/5ML PO SUSP: 30.0000 mL | Freq: Every day | ORAL | Status: DC | PRN

## 2013-03-18 NOTE — H&P (Signed)
History and Physical       Hospital Admission Note Date: 03/18/2013  Patient name: Kaitlyn Cooper Medical record number: [id number] Date of birth: June 01, 1926 Age: 80 y.o. Gender: female  PCP: Sallee Lange, MD    Chief Complaint:  Generalized weakness with dizziness for last 4-5 days  HPI: Patient is 80 year old female with history of falls, recent T9 compression fracture, history of orthostatic hypotension on midodrine,depression, UTI, has intermittent problems with dysphagia due to esophageal stricture, presented to [facility] ER with generalized weakness, dizziness and not eating well in the last 5 days. History was obtained from the patient's family members in the room who stated that after the previous admission in 12/14, she was discharged to [facility] center rehab. She was recently home on Monday, 03/14/13. She has not been eating at all, has been feeling constipated, dizzy. Patient was started on Megace suspension outpatient but she has been refusing the liquid. The patient's family members have been having difficulty with taking care of her. Patient was noticed to have leukocytosis with white count of 13, creatinine 1.19 with BUN of 35. No chest pain or any fever chills dysuria or hematuria, abdominal pain  Review of Systems:  Constitutional: Denies fever, chills, diaphoresis, poor appetite and fatigue.  HEENT: Denies photophobia, eye pain, redness, hearing loss, ear pain, congestion, sore throat, rhinorrhea, sneezing, mouth sores, trouble swallowing, neck pain, neck stiffness and tinnitus.   Respiratory: Denies SOB, DOE, cough, chest tightness,  and wheezing.   Cardiovascular: Denies chest pain, palpitations and leg swelling.  Gastrointestinal: Denies nausea, vomiting, abdominal pain, diarrhea, constipation, blood in stool and abdominal distention.  Genitourinary: Denies dysuria, urgency, frequency, hematuria, flank pain and difficulty  urinating.  Musculoskeletal: Denies myalgias, back pain, joint swelling, arthralgias and gait problem.  Skin: Denies pallor, rash and wound.  Neurological: + dizziness, with generalized weakness no seizures or syncope or any focal weakness. Hematological: Denies adenopathy. Easy bruising, personal or family bleeding history  Psychiatric/Behavioral: Denies suicidal ideation, mood changes, confusion, nervousness, sleep disturbance and agitation  Past Medical History: Past Medical History  Diagnosis Date  . Anemia     followed by Dr.Neijstrom  . Hyperlipidemia   . Depression   . Hypothyroidism   . History of atherosclerotic cardiovascular disease   . History of colonoscopy     02/2002 showed few diverticula at sigmoid colon otherwise normal colonoscopy and terminal ileoscopy 1995 several colon polyps ,tubular adenoma  . GERD (gastroesophageal reflux disease)     chronic  . GERD (gastroesophageal reflux disease)     chronic gerd  . Tiredness   . T9 vertebral fracture 02/09/13    result of fall at home   Past Surgical History  Procedure Laterality Date  . Insert / replace / remove pacemaker  01/17/2009    [employer] Cardiac Pacemaker   . Cardiac catheterization  1998  . Cholecystectomy    . 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 Esophagogastroduodenoscopy (egd) with esophageal dilation  03/22/2012    Procedure: ESOPHAGOGASTRODUODENOSCOPY (EGD) WITH ESOPHAGEAL DILATION;  Surgeon: Rogene Houston, MD;  Location: [facility];  Service: Endoscopy;  Laterality: N/A;  730  . Colonoscopy N/A 12/29/2012    Procedure: COLONOSCOPY;  Surgeon: Rogene Houston, MD;  Location: [facility];  Service: Endoscopy;  Laterality: N/A;  130  . Esophagogastroduodenoscopy (egd) with esophageal dilation N/A 02/12/2013    Procedure: ESOPHAGOGASTRODUODENOSCOPY (EGD) WITH ESOPHAGEAL DILATION;  Surgeon: Rogene Houston, MD;  Location: [facility];  Service: Endoscopy;  Laterality:  N/A;    Medications: Prior to Admission medications   Medication Sig Start Date End Date Taking? Authorizing Provider  acetaminophen (TYLENOL) 500 MG tablet Take 500 mg by mouth every 6 (six) hours as needed. Pain   Yes Historical Provider, MD  Cholecalciferol (VITAMIN D) 400 UNITS capsule Take 400 Units by mouth daily.     Yes Historical Provider, MD  docusate sodium (COLACE) 100 MG capsule Take 100 mg by mouth daily.   Yes Historical Provider, MD  levothyroxine (SYNTHROID, LEVOTHROID) 75 MCG tablet Take 1 tablet (75 mcg total) by mouth daily before breakfast. 02/07/13  Yes Kathyrn Drown, MD  loperamide (IMODIUM A-D) 2 MG tablet Take 1 tablet (2 mg total) by mouth daily. 12/29/12  Yes Rogene Houston, MD  megestrol (MEGACE) 400 MG/10ML suspension Take 10 mLs (400 mg total) by mouth 2 (two) times daily. 03/03/13  Yes Butch Penny, NP  midodrine (PROAMATINE) 5 MG tablet Take 5 mg by mouth 3 (three) times daily.   Yes Historical Provider, MD  oxyCODONE (OXY IR/ROXICODONE) 5 MG immediate release tablet Take 5 mg by mouth every 6 (six) hours as needed for severe pain. pain   Yes Historical Provider, MD  ranitidine (ZANTAC) 150 MG tablet Take 150 mg by mouth 2 (two) times daily. For acid reflux   Yes Historical Provider, MD  cyanocobalamin (,VITAMIN B-12,) 1000 MCG/ML injection Inject 1,000 mcg into the muscle every 30 (thirty) days.    Historical Provider, MD  magnesium hydroxide (MILK OF MAGNESIA) 400 MG/5ML suspension Take 30 mLs by mouth daily as needed for mild constipation.    Historical Provider, MD    Allergies:   Allergies  Allergen Reactions  . Penicillins Rash    Social History:  reports that she quit smoking about 41 years ago. She has never used smokeless tobacco. She reports that she does not drink alcohol or use illicit drugs.patient is currently at home with her sister in using assistance for ambulation   Family History: History reviewed. No pertinent family  history.  Physical Exam: Blood pressure 135/64, pulse 96, temperature 98.1 F (36.7 C), temperature source Oral, resp. rate 20, height 5\' 3"  (1.6 m), weight 43.545 kg (96 lb), SpO2 95.00%. General: Alert, awake, oriented, in no acute distress, very frail and weak. HEENT: normocephalic, atraumatic, anicteric sclera, pink conjunctiva, pupils equal and reactive to light and accomodation, oropharynx clear Neck: supple, no masses or lymphadenopathy, no goiter, no bruits  Heart: Regular rate and rhythm, without murmurs, rubs or gallops. Lungs: Clear to auscultation bilaterally, no wheezing, rales or rhonchi. Abdomen: Soft, nontender, nondistended, positive bowel sounds, no masses. Extremities: No clubbing, cyanosis or edema with positive pedal pulses. Neuro: Grossly intact, no focal neurological deficits, strength 5/5 upper and lower extremities bilaterally Psych: alert and oriented, flat affect Skin: no rashes or lesions, warm and dry   LABS on Admission:  Basic Metabolic Panel:  Recent Labs Lab 03/18/13 1649  NA 141  K 4.2  CL 103  CO2 21  GLUCOSE 99  BUN 35*  CREATININE 1.19*  CALCIUM 9.7   Liver Function Tests:  Recent Labs Lab 03/18/13 1649  AST 19  ALT 9  ALKPHOS 90  BILITOT 0.3  PROT 8.3  ALBUMIN 3.5   No results found for this basename: LIPASE, AMYLASE,  in the last 168 hours No results found for this basename: AMMONIA,  in the last 168 hours CBC:  Recent Labs  Lab 03/18/13 1649  WBC 13.0*  NEUTROABS 8.0*  HGB 10.8*  HCT 33.9*  MCV 93.6  PLT 344   Cardiac Enzymes:  Recent Labs Lab 03/18/13 1649  TROPONINI <0.30   BNP: No components found with this basename: POCBNP,  CBG: No results found for this basename: GLUCAP,  in the last 168 hours   Radiological Exams on Admission: Dg Chest 2 View  02/18/2013   CLINICAL DATA:  Congestion, cough, fever  EXAM: CHEST  2 VIEW  COMPARISON:  02/09/2013, 04/14/2012  FINDINGS: Mild cardiac enlargement. Two lead  cardiac pacer with generator over left thorax. Vascular pattern is normal. No infiltrate or consolidation. 4 mm calcified nodule laterally in the left upper lobe, stable and most consistent with a granuloma. Moderate hiatal hernia identified. Moderately severe thoracic compression deformity mid thoracic spine new from 04/14/2012. It appears stable from 02/09/2013. Pleural apical thickening is stable bilaterally.  IMPRESSION: Hiatal hernia. No acute cardiopulmonary process. Compression deformity thoracic spine.   Electronically Signed   By: Skipper Cliche M.D.   On: 02/18/2013 20:06   Dg Esophagus  03/08/2013   CLINICAL DATA:  Dysphagia.  EXAM: ESOPHOGRAM/BARIUM SWALLOW  TECHNIQUE: Single contrast examination was performed using  thin barium.  COMPARISON:  02/11/2013  FLUOROSCOPY TIME:  2 min and 18 seconds  FINDINGS: Initial barium swallows demonstrate normal pharyngeal motion with swallowing. No where ischial penetration or aspiration. A tiny Zenker's diverticulum is again demonstrated.  Diffuse esophageal spasm is noted. There are numerous mid esophageal diverticuli. There is also a large hiatal hernia. No GE reflux was demonstrated. The 13 mm barium pill would not pass through the distal esophagus.  IMPRESSION: Tiny Zenker's diverticulum.  Esophageal dysmotility with diffuse esophageal spasm.  Numerous mid esophageal diverticuli.  Large hiatal hernia.  A 13 mm barium pill would not pass through the esophagus.  No change from recent prior study.   Electronically Signed   By: Kalman Jewels M.D.   On: 03/08/2013 08:20   Dg Chest Portable 1 View  03/18/2013   CLINICAL DATA:  Weakness, dizziness, history pacemaker, GERD  EXAM: PORTABLE CHEST - 1 VIEW  COMPARISON:  02/18/2013  FINDINGS: Left subclavian transvenous pacemaker leads project at right atrium and right ventricle.  Normal heart size, mediastinal contours, and pulmonary vascularity.  Atherosclerotic calcifications aorta.  Calcified granuloma left  upper lobe with calcified left hilar adenopathy.  COPD changes with biapical pleuroparenchymal scarring greater on right, stable.  No definite acute infiltrate, pleural effusion or pneumothorax.  Bones appear demineralized.  IMPRESSION: Changes of COPD and old granulomatous disease with stable biapical scarring greater on right.  No acute abnormalities.   Electronically Signed   By: Lavonia Dana M.D.   On: 03/18/2013 16:58    Assessment/Plan Principal Problem:   Acute kidney injury due to anorexia and dehydration - Patient will be admitted to [facility], place on gentle hydration - Will check orthostatic vitals however she is on midodrine, will continue - Continue nutritional supplements, encourage oral diet, PTOT evaluation  Active Problems:   HYPOTHYROIDISM - Continue Synthroid, recent TSH 0.3 in 12/ 2014    GASTROESOPHAGEAL REFLUX DISEASE, CHRONIC - Continue ranitidine - Patient's family requested to be placed on oral Megace tablet, she is refusing Megace suspension - She is currently on regular diet with chopped meat    Wedge compression fracture of T9 vertebra - Continue oxycodone as needed for pain, stool softeners - Patient also has a history of chronic diarrhea, need to adjust stool softeners  if patient has any diarrhea currently she is complaining of constipation    Leukocytosis: Unclear etiology no URI symptoms, UA is pending - Patient has a history of UTI in the past, follow urine analysis and culture, will need to be placed on antibiotics if she does have UTI    Generalized weakness - PTOT evaluation  DVT prophylaxis: Lovenox  CODE STATUS: Discussed in detail with the patient's family, requested DO NOT RESUSCITATE status  Family Communication: Admission, patients condition and plan of care including tests being ordered have been discussed with the patient and family who indicates understanding and agree with the plan and Code Status   Further plan will depend as patient's  clinical course evolves and further radiologic and laboratory data become available.   Time Spent on Admission: 1 hour  Zykiria Bruening M.D. [facility] 03/18/2013, 6:00 PM Pager: [phone]  If 7PM-7AM, please contact night-coverage [email or web address] Password [email or web address]

## 2013-03-18 NOTE — ED Provider Notes (Signed)
CSN: [id number]     Arrival date & time 03/18/13  1625 History  This chart was scribed for Kaitlyn Diego, MD by Einar Pheasant, ED Scribe. This patient was seen in room APA04/APA04 and the patient's care was started at 4:32 PM.    Chief Complaint  Patient presents with  . Dizziness   Patient is a 80 y.o. female presenting with dizziness. The history is provided by the patient and a relative. No language interpreter was used.  Dizziness Quality:  Head spinning Severity:  Mild Onset quality:  Gradual Duration:  2 days Timing:  Intermittent Chronicity:  New Worsened by:  Nothing tried Ineffective treatments:  None tried  HPI Comments: Kaitlyn Cooper is a 80 y.o. female who presents to the Emergency Department complaining of dizziness that started 2 ago. Pt sister states that on 1 month ago pt fell and suffered a T-9 compression. She was hospitalized for a period 6 days. Upon discharge she was assigned to physical therapy once a week. Pt's sister says that when she returned home she was fine, but 5 days ago she wouldn't eat or drink anything. Pt states that she does not have much appetite for food. Pt denies any myalgias.   Past Medical History  Diagnosis Date  . Anemia     followed by Dr.Neijstrom  . Hyperlipidemia   . Depression   . Hypothyroidism   . History of atherosclerotic cardiovascular disease   . History of colonoscopy     02/2002 showed few diverticula at sigmoid colon otherwise normal colonoscopy and terminal ileoscopy 1995 several colon polyps ,tubular adenoma  . GERD (gastroesophageal reflux disease)     chronic  . GERD (gastroesophageal reflux disease)     chronic gerd  . Tiredness   . T9 vertebral fracture 02/09/13    result of fall at home   Past Surgical History  Procedure Laterality Date  . Insert / replace / remove pacemaker  01/17/2009    [employer]ude Cardiac Pacemaker   . Cardiac catheterization  1998  . Cholecystectomy    . Temporal carcinoma  2004     excision of right temporal carcinoma   . Esophagogastroduodenoscopy (egd) with esophageal dilation  03/22/2012    Procedure: ESOPHAGOGASTRODUODENOSCOPY (EGD) WITH ESOPHAGEAL DILATION;  Surgeon: Rogene Houston, MD;  Location: [facility];  Service: Endoscopy;  Laterality: N/A;  730  . Colonoscopy N/A 12/29/2012    Procedure: COLONOSCOPY;  Surgeon: Rogene Houston, MD;  Location: [facility];  Service: Endoscopy;  Laterality: N/A;  130  . Esophagogastroduodenoscopy (egd) with esophageal dilation N/A 02/12/2013    Procedure: ESOPHAGOGASTRODUODENOSCOPY (EGD) WITH ESOPHAGEAL DILATION;  Surgeon: Rogene Houston, MD;  Location: [facility];  Service: Endoscopy;  Laterality: N/A;   No family history on file. History  Substance Use Topics  . Smoking status: Former Smoker -- 1.00 packs/day for 40 years    Quit date: 08/25/1971  . Smokeless tobacco: Never Used  . Alcohol Use: No   OB History   Grav Para Term Preterm Abortions TAB SAB Ect Mult Living                 Review of Systems  Constitutional: Positive for appetite change.  Neurological: Positive for dizziness.  All other systems reviewed and are negative.    Allergies  Penicillins  Home Medications   Current Outpatient Rx  Name  Route  Sig  Dispense  Refill  . acetaminophen (TYLENOL) 500 MG tablet   Oral  Take 500 mg by mouth every 6 (six) hours as needed. Pain         . Cholecalciferol (VITAMIN D) 400 UNITS capsule   Oral   Take 400 Units by mouth daily.           . cyanocobalamin (,VITAMIN B-12,) 1000 MCG/ML injection   Intramuscular   Inject 1,000 mcg into the muscle every 30 (thirty) days.         . feeding supplement, ENSURE COMPLETE, (ENSURE COMPLETE) LIQD   Oral   Take 237 mLs by mouth 2 (two) times daily between meals.         Marland Kitchen HYDROcodone-acetaminophen (NORCO) 5-325 MG per tablet   Oral   Take 1 tablet by mouth every 4 (four) hours as needed for moderate pain. *MAX APAP-3 GR/24 HOURS FROM ALL  SOURCES*   120 tablet   0   . levothyroxine (SYNTHROID, LEVOTHROID) 75 MCG tablet   Oral   Take 1 tablet (75 mcg total) by mouth daily before breakfast.   30 tablet   5   . loperamide (IMODIUM A-D) 2 MG tablet   Oral   Take 1 tablet (2 mg total) by mouth daily.   1 tablet   0   . magnesium hydroxide (MILK OF MAGNESIA) 400 MG/5ML suspension   Oral   Take 30 mLs by mouth daily as needed for mild constipation.         . megestrol (MEGACE) 400 MG/10ML suspension   Oral   Take 10 mLs (400 mg total) by mouth 2 (two) times daily.   240 mL   3   . OxyCODONE (OXYCONTIN) 10 mg T12A 12 hr tablet   Oral   Take 10 mg by mouth daily.         . ranitidine (ZANTAC) 150 MG tablet   Oral   Take 150 mg by mouth 2 (two) times daily. For acid reflux         . sertraline (ZOLOFT) 100 MG tablet   Oral   Take 50 mg by mouth daily.           Triage vitals: BP 135/64  Pulse 96  Temp(Src) 98.1 F (36.7 C) (Oral)  Resp 20  Ht 5\' 3"  (1.6 m)  Wt 96 lb (43.545 kg)  BMI 17.01 kg/m2  SpO2 95%  Physical Exam  Nursing note and vitals reviewed. Constitutional: She is oriented to person, place, and time. She appears well-developed.  HENT:  Head: Normocephalic.  Dry mucous membrane.   Eyes: Conjunctivae and EOM are normal. No scleral icterus.  Neck: Neck supple. No thyromegaly present.  Cardiovascular: Normal rate and regular rhythm.  Exam reveals no gallop and no friction rub.   No murmur heard. Pulmonary/Chest: Effort normal and breath sounds normal. No stridor. She has no wheezes. She has no rales. She exhibits no tenderness.  Abdominal: Soft. Bowel sounds are normal. She exhibits no distension. There is no tenderness. There is no rebound.  Musculoskeletal: Normal range of motion. She exhibits no edema.  Lymphadenopathy:    She has no cervical adenopathy.  Neurological: She is oriented to person, place, and time. She exhibits normal muscle tone. Coordination normal.  Skin: No  rash noted. No erythema.  Psychiatric: She has a normal mood and affect. Her behavior is normal.    ED Course  Procedures (including critical care time)  DIAGNOSTIC STUDIES: Oxygen Saturation is 95% on RA, adequate by my interpretation.    COORDINATION OF CARE: 4:27  PM- Will order fluids. Pt advised of plan for treatment and pt agrees.  Medications  sodium chloride 0.9 % bolus 500 mL (500 mLs Intravenous New Bag/Given 03/18/13 1650)     Labs Review Labs Reviewed  CBC WITH DIFFERENTIAL - Abnormal; Notable for the following:    WBC 13.0 (*)    RBC 3.62 (*)    Hemoglobin 10.8 (*)    HCT 33.9 (*)    Neutro Abs 8.0 (*)    Monocytes Absolute 1.3 (*)    All other components within normal limits  COMPREHENSIVE METABOLIC PANEL  TROPONIN I  URINALYSIS, ROUTINE W REFLEX MICROSCOPIC   Imaging Review Dg Chest Portable 1 View  03/18/2013   CLINICAL DATA:  Weakness, dizziness, history pacemaker, GERD  EXAM: PORTABLE CHEST - 1 VIEW  COMPARISON:  02/18/2013  FINDINGS: Left subclavian transvenous pacemaker leads project at right atrium and right ventricle.  Normal heart size, mediastinal contours, and pulmonary vascularity.  Atherosclerotic calcifications aorta.  Calcified granuloma left upper lobe with calcified left hilar adenopathy.  COPD changes with biapical pleuroparenchymal scarring greater on right, stable.  No definite acute infiltrate, pleural effusion or pneumothorax.  Bones appear demineralized.  IMPRESSION: Changes of COPD and old granulomatous disease with stable biapical scarring greater on right.  No acute abnormalities.   Electronically Signed   By: Lavonia Dana M.D.   On: 03/18/2013 16:58    EKG Interpretation   None       MDM  Admit dehydration  The chart was scribed for me under my direct supervision.  I personally performed the history, physical, and medical decision making and all procedures in the evaluation of this patient.Kaitlyn Diego, MD 03/18/13 [phone]

## 2013-03-18 NOTE — Telephone Encounter (Signed)
Forwarded to [name] to address 03-21-13.

## 2013-03-18 NOTE — ED Notes (Signed)
Pt has had progressively worsening dizziness and generalized weakness "for awhile" pt states at least a week. Family states her BP has been bottoming out, BP 142/74 for EMS.

## 2013-03-18 NOTE — Telephone Encounter (Signed)
Kaitlyn Cooper is needing the Megestrol changed back to the pill form. [name] had given them the liquid because [name] was having trouble swallowing at the time. It taste so bad she is refusing to take it now. Kaitlyn Cooper feels like she could the the pill down. Her return phone number is [phone].

## 2013-03-19 DIAGNOSIS — E43 Unspecified severe protein-calorie malnutrition: Secondary | ICD-10-CM | POA: Diagnosis present

## 2013-03-19 DIAGNOSIS — E039 Hypothyroidism, unspecified: Secondary | ICD-10-CM

## 2013-03-19 LAB — BASIC METABOLIC PANEL
BUN: 29 mg/dL — ABNORMAL HIGH (ref 6–23)
CO2: 21 mEq/L (ref 19–32)
Calcium: 8.9 mg/dL (ref 8.4–10.5)
Chloride: 110 mEq/L (ref 96–112)
Creatinine, Ser: 1.02 mg/dL (ref 0.50–1.10)
GFR calc Af Amer: 57 mL/min — ABNORMAL LOW (ref >90)
GFR, EST NON AFRICAN AMERICAN: 49 mL/min — AB (ref >90)
Glucose, Bld: 79 mg/dL (ref 70–99)
POTASSIUM: 4.8 meq/L (ref 3.7–5.3)
SODIUM: 143 meq/L (ref 137–147)

## 2013-03-19 LAB — CBC
HCT: 29.6 % — ABNORMAL LOW (ref 36.0–46.0)
HEMOGLOBIN: 9.4 g/dL — AB (ref 12.0–15.0)
MCH: 30.3 pg (ref 26.0–34.0)
MCHC: 31.8 g/dL (ref 30.0–36.0)
MCV: 95.5 fL (ref 78.0–100.0)
PLATELETS: 285 10*3/uL (ref 150–400)
RBC: 3.1 MIL/uL — AB (ref 3.87–5.11)
RDW: 15.9 % — ABNORMAL HIGH (ref 11.5–15.5)
WBC: 10.3 10*3/uL (ref 4.0–10.5)

## 2013-03-19 MED ORDER — ENSURE COMPLETE PO LIQD
237.0000 mL | Freq: Three times a day (TID) | ORAL | Status: DC
  Administered 2013-03-19 – 2013-03-21 (×4): 237 mL via ORAL

## 2013-03-19 MED ORDER — SODIUM CHLORIDE 0.9 % IV SOLN
INTRAVENOUS | Status: DC
  Administered 2013-03-19 – 2013-03-21 (×4): via INTRAVENOUS

## 2013-03-19 NOTE — Progress Notes (Signed)
[facility] PROGRESS NOTE  Kaitlyn Cooper SEG:[id number] DOB: 06-08-1926 DOA: 03/18/2013 PCP: Sallee Lange, MD  Assessment/Plan: Principal Problem:   Acute kidney injury - Most likely due to history of poor oral intake - Resolved with IV fluid rehydration  Active Problems:   HYPOTHYROIDISM - Obtain TSH levels - Otherwise continue home Synthroid regimen    GASTROESOPHAGEAL REFLUX DISEASE, CHRONIC - Continue Pepcid    Wedge compression fracture of T9 vertebra - Continue pain control, given history of orthostatic hypotension we'll discontinue Dilaudid    Dehydration - Resolving after fluid rehydration    Leukocytosis - Resolving after fluid rehydration most likely due to hemoconcentration from dehydration. No active source of infection identified. Chest x-ray reported no acute abnormalities - UA negative for source    Generalized weakness - Most likely due to protein calorie malnutrition and dehydration - Reassess orthostatic vitals every shift    Protein-calorie malnutrition, severe - Continue ensure supplementation and I encouraged increase oral intake  Code Status: DNR Family Communication: Discussed with patient and sister at bedside Disposition Plan: Pending improvement in orthostatic hypotension. And physical therapy recommendations.   Consultants:  Physical therapy  Procedures:  None  Antibiotics: None  HPI/Subjective: No new complaints. No acute issues overnight. Discuss condition with patient and sister. I do not have encouraged increase by mouth intake of solids and fluids. Patient verbalizes agreement.  Objective: Filed Vitals:   03/19/13 0411  BP: 144/63  Pulse: 72  Temp: 98.3 F (36.8 C)  Resp: 20    Intake/Output Summary (Last 24 hours) at 03/19/13 1625 Last data filed at 03/19/13 0416  Gross per 24 hour  Intake      0 ml  Output    408 ml  Net   -408 ml   Filed Weights   03/18/13 1629  Weight: 43.545 kg (96 lb)     Exam:   General:  Patient in no acute distress, alert and away  Cardiovascular: Regular rate and rhythm, no murmurs or rubs  Respiratory: Clear to auscultation bilaterally, no wheezes, no increased work of breathing this  Abdomen: Soft, nondistended, nontender  Musculoskeletal: No cyanosis or clubbing   Data Reviewed: Basic Metabolic Panel:  Recent Labs Lab 03/18/13 1649 03/19/13 0519  NA 141 143  K 4.2 4.8  CL 103 110  CO2 21 21  GLUCOSE 99 79  BUN 35* 29*  CREATININE 1.19* 1.02  CALCIUM 9.7 8.9   Liver Function Tests:  Recent Labs Lab 03/18/13 1649  AST 19  ALT 9  ALKPHOS 90  BILITOT 0.3  PROT 8.3  ALBUMIN 3.5   No results found for this basename: LIPASE, AMYLASE,  in the last 168 hours No results found for this basename: AMMONIA,  in the last 168 hours CBC:  Recent Labs Lab 03/18/13 1649 03/19/13 0519  WBC 13.0* 10.3  NEUTROABS 8.0*  --   HGB 10.8* 9.4*  HCT 33.9* 29.6*  MCV 93.6 95.5  PLT 344 285   Cardiac Enzymes:  Recent Labs Lab 03/18/13 1649  TROPONINI <0.30   BNP (last 3 results) No results found for this basename: PROBNP,  in the last 8760 hours CBG: No results found for this basename: GLUCAP,  in the last 168 hours  No results found for this or any previous visit (from the past 240 hour(s)).   Studies: Dg Chest Portable 1 View  03/18/2013   CLINICAL DATA:  Weakness, dizziness, history pacemaker, GERD  EXAM: PORTABLE CHEST - 1 VIEW  COMPARISON:  02/18/2013  FINDINGS: Left subclavian transvenous pacemaker leads project at right atrium and right ventricle.  Normal heart size, mediastinal contours, and pulmonary vascularity.  Atherosclerotic calcifications aorta.  Calcified granuloma left upper lobe with calcified left hilar adenopathy.  COPD changes with biapical pleuroparenchymal scarring greater on right, stable.  No definite acute infiltrate, pleural effusion or pneumothorax.  Bones appear demineralized.  IMPRESSION: Changes of  COPD and old granulomatous disease with stable biapical scarring greater on right.  No acute abnormalities.   Electronically Signed   By: Lavonia Dana M.D.   On: 03/18/2013 16:58    Scheduled Meds: . cholecalciferol  400 Units Oral Daily  . docusate sodium  100 mg Oral Daily  . enoxaparin (LOVENOX) injection  30 mg Subcutaneous Q24H  . famotidine  20 mg Oral Daily  . feeding supplement (ENSURE COMPLETE)  237 mL Oral TID WC  . levothyroxine  75 mcg Oral QAC breakfast  . megestrol  40 mg Oral Daily  . midodrine  5 mg Oral TID WC  . senna  1 tablet Oral QHS   Continuous Infusions: . sodium chloride      Time spent: > 35 minutes    Velvet Bathe  [facility] Pager [phone]. If 7PM-7AM, please contact night-coverage at [email or web address], password [email or web address] 03/19/2013, 4:25 PM  LOS: 1 day

## 2013-03-19 NOTE — Progress Notes (Signed)
INITIAL NUTRITION ASSESSMENT  DOCUMENTATION CODES Per approved criteria  -Severe malnutrition in the context of chronic illness   INTERVENTION: Ensure Complete po TID, each supplement provides 350 kcal and 13 grams of protein. D/C Boost High Protein.   NUTRITION DIAGNOSIS: Inadequate oral intake related to decreased appetite as evidenced by PO: 50%, fat and muscle loss.   Goal: Pt will meet > 90% of estimated nutritional needs  Monitor:  PO and supplement intake, skin assessments, labs, weight changes, changes in status  Reason for Assessment: MST-4  80 y.o. female  Admitting Dx: Acute kidney injury  ASSESSMENT: Pt admitted for generalized weakness and dizziness. She reports that she was just discharged from [facility] earlier this week and since then she has had no appetite ("I just felt full all the time"). No dysphagia per pt report. She reports that appetite has gotten better since hospitalization. This RD noticed approximately 50% of lunch tray was eaten.  Wt hx reveals wt loss over the past year; 11% loss x 1year, 8.5% loss x 6 months, 10.2% wt loss x 3 months (which is clinically significant), 6.8% loss x 1 month (which is clinically significant), and 2% loss x 1 week (which is clinically significant).  She reports that she has not tried Boost yet, but reveals that she had Ensure at [facility], which she tolerated well. She has not been drinking supplements at home.  Nutrition Focused Physical Exam:  Subcutaneous Fat:  Orbital Region: moderate depletion Upper Arm Region: severe depletion Thoracic and Lumbar Region: severe depletion  Muscle:  Temple Region: moderate depletion  Clavicle Bone Region: severe depletion Clavicle and Acromion Bone Region: moderate depletion Scapular Bone Region: severe depletion  Dorsal Hand: severe depletion  Patellar Region: severe depletion  Anterior Thigh Region: severe depletion Posterior Calf Region: moderate depletion  Edema: none present  Pt  meets criteria for severe MALNUTRITION in the context of chronic illness as evidenced by severe muscle and fat depletion, 10.2% wt loss x 3 months, <50% energy intake x 5 days.  Height: Ht Readings from Last 1 Encounters:  03/18/13 5\' 3"  (1.6 m)    Weight: Wt Readings from Last 1 Encounters:  03/18/13 96 lb (43.545 kg)    Ideal Body Weight: 115#  % Ideal Body Weight: 83%  Wt Readings from Last 10 Encounters:  03/18/13 96 lb (43.545 kg)  03/07/13 98 lb (44.453 kg)  03/03/13 96 lb 8 oz (43.772 kg)  02/15/13 103 lb 9.9 oz (47 kg)  02/15/13 103 lb 9.9 oz (47 kg)  02/03/13 103 lb 1.6 oz (46.766 kg)  02/01/13 104 lb 6.4 oz (47.356 kg)  12/31/12 107 lb 6 oz (48.705 kg)  12/29/12 107 lb (48.535 kg)  12/29/12 107 lb (48.535 kg)    Usual Body Weight: 115#  % Usual Body Weight: 83%  BMI:  Body mass index is 17.01 kg/(m^2). Meets criteria for underweight.   Estimated Nutritional Needs: Kcal: 1517-6160 daily Protein: 54-65 grams daily Fluid: 1.3-1.5 L daily  Skin: WDL  Diet Order: Cardiac  EDUCATION NEEDS: -Education needs addressed   Intake/Output Summary (Last 24 hours) at 03/19/13 1259 Last data filed at 03/19/13 0416  Gross per 24 hour  Intake      0 ml  Output    408 ml  Net   -408 ml    Last BM: 03/18/13   Labs:   Recent Labs Lab 03/18/13 1649 03/19/13 0519  NA 141 143  K 4.2 4.8  CL 103 110  CO2 21  21  BUN 35* 29*  CREATININE 1.19* 1.02  CALCIUM 9.7 8.9  GLUCOSE 99 79    CBG (last 3)  No results found for this basename: GLUCAP,  in the last 72 hours  Scheduled Meds: . sodium chloride   Intravenous STAT  . cholecalciferol  400 Units Oral Daily  . docusate sodium  100 mg Oral Daily  . enoxaparin (LOVENOX) injection  30 mg Subcutaneous Q24H  . famotidine  20 mg Oral Daily  . lactose free nutrition  237 mL Oral TID WC  . levothyroxine  75 mcg Oral QAC breakfast  . megestrol  40 mg Oral Daily  . midodrine  5 mg Oral TID WC  . senna  1 tablet  Oral QHS    Continuous Infusions: . sodium chloride 75 mL/hr at 03/18/13 2214    Past Medical History  Diagnosis Date  . Anemia     followed by Dr.Neijstrom  . Hyperlipidemia   . Depression   . Hypothyroidism   . History of atherosclerotic cardiovascular disease   . History of colonoscopy     02/2002 showed few diverticula at sigmoid colon otherwise normal colonoscopy and terminal ileoscopy 1995 several colon polyps ,tubular adenoma  . GERD (gastroesophageal reflux disease)     chronic  . GERD (gastroesophageal reflux disease)     chronic gerd  . Tiredness   . T9 vertebral fracture 02/09/13    result of fall at home    Past Surgical History  Procedure Laterality Date  . Insert / replace / remove pacemaker  01/17/2009    [employer]ude Cardiac Pacemaker   . Cardiac catheterization  1998  . Cholecystectomy    . 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 Esophagogastroduodenoscopy (egd) with esophageal dilation  03/22/2012    Procedure: ESOPHAGOGASTRODUODENOSCOPY (EGD) WITH ESOPHAGEAL DILATION;  Surgeon: Rogene Houston, MD;  Location: [facility];  Service: Endoscopy;  Laterality: N/A;  730  . Colonoscopy N/A 12/29/2012    Procedure: COLONOSCOPY;  Surgeon: Rogene Houston, MD;  Location: [facility];  Service: Endoscopy;  Laterality: N/A;  130  . Esophagogastroduodenoscopy (egd) with esophageal dilation N/A 02/12/2013    Procedure: ESOPHAGOGASTRODUODENOSCOPY (EGD) WITH ESOPHAGEAL DILATION;  Surgeon: Rogene Houston, MD;  Location: [facility];  Service: Endoscopy;  Laterality: N/A;    Glennis Montenegro A. Jimmye Norman, RD, LDN Pager: [phone]

## 2013-03-19 NOTE — Progress Notes (Signed)
Utilization review completed.  P.J. Myanna Ziesmer,RN,BSN Case Manager [phone]  

## 2013-03-20 DIAGNOSIS — D126 Benign neoplasm of colon, unspecified: Secondary | ICD-10-CM

## 2013-03-20 LAB — URINE CULTURE
Colony Count: NO GROWTH
Colony Count: 15000
Culture: NO GROWTH

## 2013-03-20 LAB — TSH: TSH: 0.35 u[IU]/mL (ref 0.350–4.500)

## 2013-03-20 MED ORDER — SODIUM CHLORIDE 0.9 % IV BOLUS (SEPSIS)
250.0000 mL | Freq: Once | INTRAVENOUS | Status: AC
  Administered 2013-03-20: 250 mL via INTRAVENOUS

## 2013-03-20 NOTE — Progress Notes (Signed)
[facility] PROGRESS NOTE  SHAREA GUINTHER PXT:[id number] DOB: 1926/04/10 DOA: 03/18/2013 PCP: Sallee Lange, MD  Assessment/Plan: Principal Problem:   Acute kidney injury - Most likely due to history of poor oral intake - Resolved with IV fluid rehydration  Active Problems:   HYPOTHYROIDISM - Obtain TSH levels - Otherwise continue home Synthroid regimen    GASTROESOPHAGEAL REFLUX DISEASE, CHRONIC - Continue Pepcid    Wedge compression fracture of T9 vertebra - Continue pain control, given history of orthostatic hypotension Dilaudid    Dehydration - Resolving after fluid rehydration    Leukocytosis - Resolving after fluid rehydration most likely due to hemoconcentration from dehydration. No active source of infection identified. Chest x-ray reported no acute abnormalities - UA negative for source    Generalized weakness - Most likely due to protein calorie malnutrition and dehydration - Reassess orthostatic vitals every shift    Protein-calorie malnutrition, severe - Continue ensure supplementation and I encouraged increase oral intake  Orthostatic hypotension - suspect improvement with improvement in oral intake - IVF rehydration initially - Assess morning cortisol levels (pending)  Code Status: DNR Family Communication: Discussed with patient and sister at bedside Disposition Plan: Pending improvement in orthostatic hypotension. And physical therapy recommendations.   Consultants:  Physical therapy  Procedures:  None  Antibiotics: None  HPI/Subjective: No new complaints. No acute issues overnight.  Objective: Filed Vitals:   03/20/13 1420  BP: 187/60  Pulse: 64  Temp: 98 F (36.7 C)  Resp: 18    Intake/Output Summary (Last 24 hours) at 03/20/13 1431 Last data filed at 03/20/13 0702  Gross per 24 hour  Intake    240 ml  Output    700 ml  Net   -460 ml   Filed Weights   03/18/13 1629  Weight: 43.545 kg (96 lb)    Exam:   General:   Patient in no acute distress, alert and away  Cardiovascular: Regular rate and rhythm, no murmurs or rubs  Respiratory: Clear to auscultation bilaterally, no wheezes, no increased work of breathing this  Abdomen: Soft, nondistended, nontender  Musculoskeletal: No cyanosis or clubbing   Data Reviewed: Basic Metabolic Panel:  Recent Labs Lab 03/18/13 1649 03/19/13 0519  NA 141 143  K 4.2 4.8  CL 103 110  CO2 21 21  GLUCOSE 99 79  BUN 35* 29*  CREATININE 1.19* 1.02  CALCIUM 9.7 8.9   Liver Function Tests:  Recent Labs Lab 03/18/13 1649  AST 19  ALT 9  ALKPHOS 90  BILITOT 0.3  PROT 8.3  ALBUMIN 3.5   No results found for this basename: LIPASE, AMYLASE,  in the last 168 hours No results found for this basename: AMMONIA,  in the last 168 hours CBC:  Recent Labs Lab 03/18/13 1649 03/19/13 0519  WBC 13.0* 10.3  NEUTROABS 8.0*  --   HGB 10.8* 9.4*  HCT 33.9* 29.6*  MCV 93.6 95.5  PLT 344 285   Cardiac Enzymes:  Recent Labs Lab 03/18/13 1649  TROPONINI <0.30   BNP (last 3 results) No results found for this basename: PROBNP,  in the last 8760 hours CBG: No results found for this basename: GLUCAP,  in the last 168 hours  No results found for this or any previous visit (from the past 240 hour(s)).   Studies: Dg Chest Portable 1 View  03/18/2013   CLINICAL DATA:  Weakness, dizziness, history pacemaker, GERD  EXAM: PORTABLE CHEST - 1 VIEW  COMPARISON:  02/18/2013  FINDINGS: Left subclavian transvenous  pacemaker leads project at right atrium and right ventricle.  Normal heart size, mediastinal contours, and pulmonary vascularity.  Atherosclerotic calcifications aorta.  Calcified granuloma left upper lobe with calcified left hilar adenopathy.  COPD changes with biapical pleuroparenchymal scarring greater on right, stable.  No definite acute infiltrate, pleural effusion or pneumothorax.  Bones appear demineralized.  IMPRESSION: Changes of COPD and old granulomatous  disease with stable biapical scarring greater on right.  No acute abnormalities.   Electronically Signed   By: Lavonia Dana M.D.   On: 03/18/2013 16:58    Scheduled Meds: . cholecalciferol  400 Units Oral Daily  . docusate sodium  100 mg Oral Daily  . enoxaparin (LOVENOX) injection  30 mg Subcutaneous Q24H  . famotidine  20 mg Oral Daily  . feeding supplement (ENSURE COMPLETE)  237 mL Oral TID WC  . levothyroxine  75 mcg Oral QAC breakfast  . megestrol  40 mg Oral Daily  . midodrine  5 mg Oral TID WC  . senna  1 tablet Oral QHS   Continuous Infusions: . sodium chloride 100 mL/hr at 03/19/13 2230    Time spent: > 35 minutes    Velvet Bathe  [facility] Pager [phone]. If 7PM-7AM, please contact night-coverage at [email or web address], password [email or web address] 03/20/2013, 2:31 PM  LOS: 2 days

## 2013-03-21 DIAGNOSIS — I951 Orthostatic hypotension: Secondary | ICD-10-CM

## 2013-03-21 HISTORY — DX: Orthostatic hypotension: I95.1

## 2013-03-21 LAB — FOLATE: FOLATE: 14.3 ng/mL

## 2013-03-21 LAB — VITAMIN B12: Vitamin B-12: 807 pg/mL (ref 211–911)

## 2013-03-21 MED ORDER — FLUDROCORTISONE ACETATE 0.1 MG PO TABS
0.1000 mg | ORAL_TABLET | Freq: Every day | ORAL | Status: DC
  Administered 2013-03-21: 0.1 mg via ORAL
  Filled 2013-03-21 (×3): qty 1

## 2013-03-21 NOTE — Progress Notes (Signed)
Pt's IV site leaking. Made one attempt to gain another IV access site. Pt states she is a very hard stick and would like to wait til later to try again. Pt told that I could try to get House Supervisor to attempt IV site. Pt agrees to letting him try. Supervisor notified.

## 2013-03-21 NOTE — Telephone Encounter (Signed)
Patient is presently an inpatient at [facility]. PO Megace looks like has been ordered

## 2013-03-21 NOTE — Progress Notes (Signed)
Patient ID: BRADEE COMMON  female  OEV:[id number]    DOB: [birth year]    DOA: 03/18/2013  PCP: Sallee Lange, MD  Assessment/Plan: Principal Problem: Acute kidney injury - resolved - Most likely due to history of poor oral intake  - Resolved with IV fluid rehydration   Orthostatic hypotension- still orthostatic - TSH 0.3, patient had B12 levels checked 1287 - Check folate - Started patient on Florinef 0.1 mg daily, continue midodrine - Will check 2-D echocardiogram to rule out any CHF which will otherwise worsen with Florinef  HYPOTHYROIDISM  - continue Synthroid   GASTROESOPHAGEAL REFLUX DISEASE, CHRONIC  - Continue Pepcid   Wedge compression fracture of T9 vertebra  - Continue pain control, given history of orthostatic hypotension we'll discontinue Dilaudid   Dehydration  - Resolving after fluid rehydration   Leukocytosis  - Resolved after fluid rehydration most likely due to hemoconcentration from dehydration. No active source of infection identified. Chest x-ray reported no acute abnormalities  - UA negative for source   Generalized weakness  - Most likely due to protein calorie malnutrition and dehydration  - Reassess orthostatic vitals every shift   Protein-calorie malnutrition, severe  - Continue ensure supplementation and encouraged increase oral intake  DVT Prophylaxis:  Code Status: DNR  Family Communication:  Disposition: will evaluate with PT, if needs home health services versus rehabilitation     Subjective: Patient alert and oriented, still orthostatic on the vitals this morning. BP was 88/53 on standing  Objective: Weight change:   Intake/Output Summary (Last 24 hours) at 03/21/13 1259 Last data filed at 03/20/13 1756  Gross per 24 hour  Intake 2783.33 ml  Output    150 ml  Net 2633.33 ml   Blood pressure 88/53, pulse 93, temperature 97.3 F (36.3 C), temperature source Oral, resp. rate 18, height 5\' 3"  (1.6 m), weight 43.545 kg (96 lb),  SpO2 98.00%.  Physical Exam: General: Alert and awake, oriented, not in any acute distress. CVS: S1-S2 clear Chest: clear to auscultation bilaterally, no wheezing, rales or rhonchi Abdomen: soft nontender, nondistended, normal bowel sounds  Extremities: no cyanosis, clubbing or edema noted bilaterally Neuro: Cranial nerves II-XII intact, no focal neurological deficits  Lab Results: Basic Metabolic Panel:  Recent Labs Lab 03/18/13 1649 03/19/13 0519  NA 141 143  K 4.2 4.8  CL 103 110  CO2 21 21  GLUCOSE 99 79  BUN 35* 29*  CREATININE 1.19* 1.02  CALCIUM 9.7 8.9   Liver Function Tests:  Recent Labs Lab 03/18/13 1649  AST 19  ALT 9  ALKPHOS 90  BILITOT 0.3  PROT 8.3  ALBUMIN 3.5   No results found for this basename: LIPASE, AMYLASE,  in the last 168 hours No results found for this basename: AMMONIA,  in the last 168 hours CBC:  Recent Labs Lab 03/18/13 1649 03/19/13 0519  WBC 13.0* 10.3  NEUTROABS 8.0*  --   HGB 10.8* 9.4*  HCT 33.9* 29.6*  MCV 93.6 95.5  PLT 344 285   Cardiac Enzymes:  Recent Labs Lab 03/18/13 1649  TROPONINI <0.30   BNP: No components found with this basename: POCBNP,  CBG: No results found for this basename: GLUCAP,  in the last 168 hours   Micro Results: Recent Results (from the past 240 hour(s))  URINE CULTURE     Status: None   Collection Time    03/18/13  6:15 PM      Result Value Range Status   Specimen Description URINE, CATHETERIZED  Final   Special Requests NONE   Final   Culture  Setup Time     Final   Value: 03/19/2013 02:52     Performed at [facility]     Final   Value: NO GROWTH     Performed at [facility]   Culture     Final   Value: NO GROWTH     Performed at [facility]   Report Status 03/20/2013 FINAL   Final  URINE CULTURE     Status: None   Collection Time    03/19/13  1:15 AM      Result Value Range Status   Specimen Description URINE, RANDOM   Final    Special Requests NONE   Final   Culture  Setup Time     Final   Value: 03/19/2013 21:31     Performed at [facility]     Final   Value: 15,000 COLONIES/ML     Performed at [facility]   Culture     Final   Value: Multiple bacterial morphotypes present, none predominant. Suggest appropriate recollection if clinically indicated.     Performed at [facility]   Report Status 03/20/2013 FINAL   Final    Studies/Results: Dg Esophagus  03/08/2013   CLINICAL DATA:  Dysphagia.  EXAM: ESOPHOGRAM/BARIUM SWALLOW  TECHNIQUE: Single contrast examination was performed using  thin barium.  COMPARISON:  02/11/2013  FLUOROSCOPY TIME:  2 min and 18 seconds  FINDINGS: Initial barium swallows demonstrate normal pharyngeal motion with swallowing. No where ischial penetration or aspiration. A tiny Zenker's diverticulum is again demonstrated.  Diffuse esophageal spasm is noted. There are numerous mid esophageal diverticuli. There is also a large hiatal hernia. No GE reflux was demonstrated. The 13 mm barium pill would not pass through the distal esophagus.  IMPRESSION: Tiny Zenker's diverticulum.  Esophageal dysmotility with diffuse esophageal spasm.  Numerous mid esophageal diverticuli.  Large hiatal hernia.  A 13 mm barium pill would not pass through the esophagus.  No change from recent prior study.   Electronically Signed   By: Kalman Jewels M.D.   On: 03/08/2013 08:20   Dg Chest Portable 1 View  03/18/2013   CLINICAL DATA:  Weakness, dizziness, history pacemaker, GERD  EXAM: PORTABLE CHEST - 1 VIEW  COMPARISON:  02/18/2013  FINDINGS: Left subclavian transvenous pacemaker leads project at right atrium and right ventricle.  Normal heart size, mediastinal contours, and pulmonary vascularity.  Atherosclerotic calcifications aorta.  Calcified granuloma left upper lobe with calcified left hilar adenopathy.  COPD changes with biapical pleuroparenchymal scarring greater on  right, stable.  No definite acute infiltrate, pleural effusion or pneumothorax.  Bones appear demineralized.  IMPRESSION: Changes of COPD and old granulomatous disease with stable biapical scarring greater on right.  No acute abnormalities.   Electronically Signed   By: Lavonia Dana M.D.   On: 03/18/2013 16:58    Medications: Scheduled Meds: . cholecalciferol  400 Units Oral Daily  . docusate sodium  100 mg Oral Daily  . enoxaparin (LOVENOX) injection  30 mg Subcutaneous Q24H  . famotidine  20 mg Oral Daily  . feeding supplement (ENSURE COMPLETE)  237 mL Oral TID WC  . fludrocortisone  0.1 mg Oral Daily  . levothyroxine  75 mcg Oral QAC breakfast  . megestrol  40 mg Oral Daily  . midodrine  5 mg Oral TID WC  . senna  1 tablet Oral QHS      LOS: 3 days   Tripp Goins M.D. [facility] 03/21/2013, 12:59 PM Pager: [phone]  If 7PM-7AM, please contact night-coverage [email or web address] Password [email or web address]

## 2013-03-21 NOTE — Evaluation (Signed)
Physical Therapy Evaluation Patient Details Name: Kaitlyn Cooper MRN: [id number] DOB: 10-26-1926 Today's Date: 03/21/2013 Time:  -     PT Assessment / Plan / Recommendation History of Present Illness  Pt is admitted with renal failure due to dehydration.  She has had poor appetite following discharge from rehab at [facility].  She has a hx of falls and currently has documented orthostatic hypotension.  Clinical Impression   Pt was seen for evaluation.  She was c/o general malaise and headache upon my arrival but was willing to cooperate.  She was medicated for HA.  She was found to have orthostatic hypotension and became very dizzy with sitting.  I discussed this with RN and decided not to try to ambulate.  Pt had no difficulty with transferring bed to chair and I recommended that she sit upright for awhile this afternoon in order to see if we could condition her to the upright position.  Once this orthostasis is under better control, I do not anticipate that she will have any difficulty walking.  I am recommending HHPT at d/c.    PT Assessment  Patient needs continued PT services    Follow Up Recommendations  Home health PT    Does the patient have the potential to tolerate intense rehabilitation      Barriers to Discharge        Equipment Recommendations  None recommended by PT    Recommendations for Other Services     Frequency Min 3X/week    Precautions / Restrictions Precautions Precautions: None Restrictions Weight Bearing Restrictions: No   Pertinent Vitals/Pain       Mobility  Bed Mobility Overal bed mobility: Needs Assistance Bed Mobility: Supine to Sit;Sit to Supine Supine to sit: Supervision;HOB elevated Sit to supine: Supervision Transfers Overall transfer level: Needs assistance Equipment used: None Transfers: Stand Pivot Transfers Sit to Stand: Modified independent (Device/Increase time) Stand pivot transfers: Modified independent (Device/Increase  time) General transfer comment: pt had dizziness with sitting and became somewhat agitated with this Ambulation/Gait Ambulation/Gait assistance:  (not done due to orthostasis)    Exercises     PT Diagnosis: Difficulty walking;Generalized weakness  PT Problem List: Decreased activity tolerance;Decreased mobility;Cardiopulmonary status limiting activity PT Treatment Interventions: Gait training;Therapeutic exercise     PT Goals(Current goals can be found in the care plan section) Acute Rehab PT Goals Patient Stated Goal: "I'm not sure." PT Goal Formulation: With patient Time For Goal Achievement: 04/04/13 Potential to Achieve Goals: Good  Visit Information  Last PT Received On: 03/21/13 History of Present Illness: Pt is admitted with renal failure due to dehydration.  She has had poor appetite following discharge from rehab at [facility].  She has a hx of falls and currently has documented orthostatic hypotension.       Prior [place] expects to be discharged to:: Private residence Living Arrangements: Other relatives (sister) Available Help at Discharge: Family Type of Home: House Home Access: Stairs to enter Technical brewer of Steps: 2 Entrance Stairs-Rails: None Home Layout: One level Home Equipment: Environmental consultant - 2 wheels;Wheelchair - manual;Bedside commode Additional Comments: Pt's younger sister came to live with her after previous fall.  Per pt, sister is 52 years younger and in sufficient health to assist with ADLs. Prior Function Level of Independence: Needs assistance Comments: Per pt, sister assisted with ADLs/IADLs Communication Communication: No difficulties    Cognition  Cognition Arousal/Alertness: Awake/alert Behavior During Therapy: WFL for tasks assessed/performed Overall Cognitive Status: Within Functional  Limits for tasks assessed    Extremity/Trunk Assessment Upper Extremity Assessment Upper Extremity Assessment:  Generalized weakness Lower Extremity Assessment Lower Extremity Assessment: Overall WFL for tasks assessed (pt is significantly deconditioned)   Balance Balance Overall balance assessment: History of Falls  End of Session PT - End of Session Equipment Utilized During Treatment: Gait belt Activity Tolerance: Patient limited by fatigue Patient left: in chair;with chair alarm set;with call bell/phone within reach Nurse Communication: Mobility status  GP     [name] 03/21/2013, 2:26 PM

## 2013-03-21 NOTE — Evaluation (Signed)
Occupational Therapy Evaluation Patient Details Name: Kaitlyn Cooper MRN: [id number] DOB: 06-22-1926 Today's Date: 03/21/2013 Time: 6295-2841 OT Time Calculation (min): 27 min Evaluation 27'  OT Assessment / Plan / Recommendation History of present illness Patient is 80 year old female with history of falls, recent T9 compression fracture, history of orthostatic hypotension on midodrine,depression, UTI, has intermittent problems with dysphagia due to esophageal stricture, presented to [facility] ER with generalized weakness, dizziness and not eating well in the last 5 days. History was obtained from the patient's family members in the room who stated that after the previous admission in 12/14, she was discharged to [facility] center rehab. She was recently home on Monday, 03/14/13. She has not been eating at all, has been feeling constipated, dizzy. Patient was started on Megace suspension outpatient but she has been refusing the liquid. The patient's family members have been having difficulty with taking care of her.   Clinical Impression   Pt is an 80 year old female presenting to acute OT with acute kidney injury and weakness.  Pt was beginning to receive [place] services after discharge from the [facility]. Pt was reminded of importance of continuing to eat in order to regain strength - pt ate breakfast during session. Pt reports her Kaitlyn Cooper is able to assist at home and that she would like to continue [facility] services. Pt need no further acute OT at this time.    OT Assessment  All further OT needs can be met in the next venue of care    Follow Up Recommendations  Home health OT (Pt was beginning [facility] services prior to admission)       Equipment Recommendations   (defer to [facility])          Precautions / Restrictions Precautions Precautions: Fall       ADL  Eating/Feeding: Set up Where Assessed - Eating/Feeding: Bed level Grooming: Set up Toilet Transfer: Minimal assistance (MinA for safety.  Pt apprehensive w transfer) Toilet Transfer Method: Sit to stand;Stand pivot Toilet Transfer Equipment: Bedside commode Toileting - Clothing Manipulation and Hygiene: Supervision/safety Where Assessed - Best boy and Hygiene: Lean right and/or left ADL Comments: Pt became dizzy with gross movements during ADL tasks    OT Diagnosis: Generalized weakness  OT Problem List: Decreased strength;Decreased coordination;Decreased activity tolerance   OT Goals Acute Rehab OT Goals Patient Stated Goal: "I'm not sure." OT Goal Formulation: With patient  Visit Information  Last OT Received On: 03/21/13 History of Present Illness: Patient is 80 year old female with history of falls, recent T9 compression fracture, history of orthostatic hypotension on midodrine,depression, UTI, has intermittent problems with dysphagia due to esophageal stricture, presented to [facility] ER with generalized weakness, dizziness and not eating well in the last 5 days. History was obtained from the patient's family members in the room who stated that after the previous admission in 12/14, she was discharged to [facility] center rehab. She was recently home on Monday, 03/14/13. She has not been eating at all, has been feeling constipated, dizzy. Patient was started on Megace suspension outpatient but she has been refusing the liquid. The patient's family members have been having difficulty with taking care of her.       Prior [place] expects to be discharged to:: Private residence Living Arrangements: Other relatives (Kaitlyn Cooper) Available Help at Discharge: Family Type of Home: House Home Access: Stairs to enter [employer] of Steps: 2 Entrance Stairs-Rails: None Home Layout: One level  Home Equipment: [place] - 2 wheels;Wheelchair - manual;Bedside commode Additional Comments: Pt's younger Kaitlyn Cooper came to live with her after previous fall.  Per pt, Kaitlyn Cooper is 55  years younger and in sufficient health to assist with ADLs. Prior Function Level of Independence: Needs assistance Comments: Per pt, Kaitlyn Cooper assisted with ADLs/IADLs Communication Communication: No difficulties       Cognition  Cognition Arousal/Alertness: Awake/alert Behavior During Therapy: WFL for tasks assessed/performed Overall Cognitive Status: Within Functional Limits for tasks assessed    Extremity/Trunk Assessment Upper Extremity Assessment Upper Extremity Assessment: Generalized weakness Lower Extremity Assessment Lower Extremity Assessment: Defer to PT evaluation     Mobility Bed Mobility Overal bed mobility: Needs Assistance Bed Mobility: Supine to Sit;Sit to Supine Supine to sit: Min assist Sit to supine: Supervision Transfers Overall transfer level: Needs assistance Equipment used: Rolling walker (2 wheeled) Transfers: Sit to/from [facility] Sit to Stand: Min assist Stand pivot transfers: Min assist General transfer comment: Pt nervous w transfers           End of Session OT - End of Session Activity Tolerance: Patient tolerated treatment well Patient left: in bed;with call bell/phone within reach;with bed alarm set  GO Functional Assessment Tool Used: Clinical Judgement Functional Limitation: Self care Self Care Current Status (G2836): At least 20 percent but less than 40 percent impaired, limited or restricted Self Care Goal Status (O2947): At least 1 percent but less than 20 percent impaired, limited or restricted Self Care Discharge Status [phone]): At least 20 percent but less than 40 percent impaired, limited or restricted   Bea Graff, [place], OTR/L [phone]  03/21/2013, 1:09 PM

## 2013-03-22 DIAGNOSIS — E2749 Other adrenocortical insufficiency: Secondary | ICD-10-CM | POA: Diagnosis present

## 2013-03-22 DIAGNOSIS — K219 Gastro-esophageal reflux disease without esophagitis: Secondary | ICD-10-CM

## 2013-03-22 DIAGNOSIS — I359 Nonrheumatic aortic valve disorder, unspecified: Secondary | ICD-10-CM

## 2013-03-22 LAB — CBC
HEMATOCRIT: 28.6 % — AB (ref 36.0–46.0)
HEMOGLOBIN: 9.5 g/dL — AB (ref 12.0–15.0)
MCH: 30.5 pg (ref 26.0–34.0)
MCHC: 33.2 g/dL (ref 30.0–36.0)
MCV: 92 fL (ref 78.0–100.0)
Platelets: 239 10*3/uL (ref 150–400)
RBC: 3.11 MIL/uL — ABNORMAL LOW (ref 3.87–5.11)
RDW: 16.1 % — ABNORMAL HIGH (ref 11.5–15.5)
WBC: 8.4 10*3/uL (ref 4.0–10.5)

## 2013-03-22 LAB — BASIC METABOLIC PANEL
BUN: 8 mg/dL (ref 6–23)
CHLORIDE: 109 meq/L (ref 96–112)
CO2: 17 mEq/L — ABNORMAL LOW (ref 19–32)
Calcium: 8.5 mg/dL (ref 8.4–10.5)
Creatinine, Ser: 0.77 mg/dL (ref 0.50–1.10)
GFR calc Af Amer: 87 mL/min — ABNORMAL LOW (ref >90)
GFR, EST NON AFRICAN AMERICAN: 75 mL/min — AB (ref >90)
Glucose, Bld: 76 mg/dL (ref 70–99)
POTASSIUM: 3.4 meq/L — AB (ref 3.7–5.3)
Sodium: 142 mEq/L (ref 137–147)

## 2013-03-22 LAB — CORTISOL: Cortisol, Plasma: 1.8 ug/dL

## 2013-03-22 MED ORDER — DEXAMETHASONE 0.5 MG PO TABS
1.0000 mg | ORAL_TABLET | Freq: Once | ORAL | Status: AC
  Administered 2013-03-22: 1 mg via ORAL
  Filled 2013-03-22: qty 2

## 2013-03-22 MED ORDER — COSYNTROPIN 0.25 MG IJ SOLR
0.2500 mg | Freq: Once | INTRAMUSCULAR | Status: DC
  Filled 2013-03-22: qty 0.25

## 2013-03-22 MED ORDER — MIDODRINE HCL 5 MG PO TABS
10.0000 mg | ORAL_TABLET | Freq: Three times a day (TID) | ORAL | Status: DC
  Administered 2013-03-22 – 2013-03-24 (×9): 10 mg via ORAL
  Filled 2013-03-22 (×11): qty 2

## 2013-03-22 MED ORDER — POTASSIUM CHLORIDE CRYS ER 20 MEQ PO TBCR
40.0000 meq | EXTENDED_RELEASE_TABLET | Freq: Once | ORAL | Status: AC
  Administered 2013-03-22: 40 meq via ORAL
  Filled 2013-03-22: qty 2

## 2013-03-22 MED ORDER — COSYNTROPIN 0.25 MG IJ SOLR
0.2500 mg | Freq: Once | INTRAMUSCULAR | Status: AC
  Administered 2013-03-22: 0.25 mg via INTRAVENOUS
  Filled 2013-03-22: qty 0.25

## 2013-03-22 NOTE — Care Management Note (Addendum)
    Page 1 of 2   03/25/2013     2:03:56 PM   CARE MANAGEMENT NOTE 03/25/2013  Patient:  Kaitlyn Cooper, Kaitlyn Cooper   Account Number:  [id number]  Date Initiated:  03/20/2013  Documentation initiated by:  DAVIS,TYMEEKA  Subjective/Objective Assessment:   80 yo female admitted with acute kidney injury and poor oral intake.     Action/Plan:   Anticipated DC Date:     Anticipated DC Plan:           Choice offered to / List presented to:          [facility] arranged  HH-2 PT  HH-1 RN  HH-3 OT      [place].   Status of service:  Completed, signed off Medicare Important Message given?   (If response is "NO", the following Medicare IM given date fields will be blank) Date Medicare IM given:   Date Additional Medicare IM given:    Discharge Disposition:  [place]  Per UR Regulation:  Reviewed for med. necessity/level of care/duration of stay  If discussed at [place] of Stay Meetings, dates discussed:   03/24/2013    Comments:  03/25/13 Claretha Cooper RN BSN CM Pt dc'ing with HH. [place] notified   03/22/13 Claretha Cooper RN BSN CM Referral made to [facility] for [place]. Stark Klein, daughter alerted that Francesca Jewett from [facility] would make contact next Monday after DC.  03/21/13 Claretha Cooper RN BSN CM Pt was recently dc from [place] center. Became dehyrated and admitted into observation.Sister is staying with her now. During an aside conversation, sister confided that she thinks pt wants to go home and die. She refuses [facility] because she is too weak. [place] talked with pt and they decided that pt would like a referral to hospice for a courtsy consult and be followed monthly. Stark Klein [phone] is the contact person.  03/20/13 [place] [phone] Ur complete

## 2013-03-22 NOTE — Consult Note (Signed)
Kaitlyn Cooper MRN: [id number] DOB/AGE: March 17, 1926 80 y.o. Primary Care Physician:LUKING,SCOTT, MD Admit date: 03/18/2013 Chief Complaint:  Consult for adrenal insufficiency HPI:  Patient is 80 year old female with history of falls, recent T9 compression fracture, history of orthostatic hypotension . She is admitted after presenting with generalizing weakness , dizziness, and poor oral intake starting 5 days PTA. She also has had  Orthostatic hypotension . Her subsequent work up showed significantly decreased random cortisol level of 1.8. This prompted endocrinology consult. Patient is still poor historian. Her hx is obtained from chart review. She required  Midodrine therapy for same as out patient.   Review of Systems:  Constitutional: Denies fever, chills, diaphoresis, poor appetite and fatigue.   Respiratory: Denies SOB, DOE, cough, chest tightness, and wheezing.  Cardiovascular: Denies chest pain, palpitations and leg swelling.  Gastrointestinal: Denies nausea, vomiting, abdominal pain, diarrhea, constipation, blood in stool and abdominal distention.  Has has report of poor oral intake. Genitourinary: Denies dysuria, urgency, frequency, hematuria, flank pain and difficulty urinating.  Musculoskeletal: Denies myalgias, back pain, joint swelling, arthralgias and gait problem.  Skin: Denies pallor, rash and wound.  Neurological: + dizziness, with generalized weakness no seizures or syncope or any focal weakness.  Hematological: Denies adenopathy. Easy bruising, personal or family bleeding history  Psychiatric/Behavioral: Denies suicidal ideation, mood changes, confusion, nervousness, sleep disturbance and agitation  Past Medical History  Diagnosis Date  . Anemia     followed by Dr.Neijstrom  . Hyperlipidemia   . Depression   . Hypothyroidism   . History of atherosclerotic cardiovascular disease   . History of colonoscopy     02/2002 showed few diverticula at sigmoid colon otherwise  normal colonoscopy and terminal ileoscopy 1995 several colon polyps ,tubular adenoma  . GERD (gastroesophageal reflux disease)     chronic  . GERD (gastroesophageal reflux disease)     chronic gerd  . Tiredness   . T9 vertebral fracture 02/09/13    result of fall at home      Social History:  reports that she quit smoking about 41 years ago. She has never used smokeless tobacco. She reports that she does not drink alcohol or use illicit drugs.  Allergies:  Allergies  Allergen Reactions  . Penicillins Rash    Medications Prior to Admission  Medication Sig Dispense Refill  . acetaminophen (TYLENOL) 500 MG tablet Take 500 mg by mouth every 6 (six) hours as needed. Pain      . Cholecalciferol (VITAMIN D) 400 UNITS capsule Take 400 Units by mouth daily.        Marland Kitchen docusate sodium (COLACE) 100 MG capsule Take 100 mg by mouth daily.      Marland Kitchen levothyroxine (SYNTHROID, LEVOTHROID) 75 MCG tablet Take 1 tablet (75 mcg total) by mouth daily before breakfast.  30 tablet  5  . loperamide (IMODIUM A-D) 2 MG tablet Take 1 tablet (2 mg total) by mouth daily.  1 tablet  0  . megestrol (MEGACE) 400 MG/10ML suspension Take 10 mLs (400 mg total) by mouth 2 (two) times daily.  240 mL  3  . midodrine (PROAMATINE) 5 MG tablet Take 5 mg by mouth 3 (three) times daily.      Marland Kitchen oxyCODONE (OXY IR/ROXICODONE) 5 MG immediate release tablet Take 5 mg by mouth every 6 (six) hours as needed for severe pain. pain      . ranitidine (ZANTAC) 150 MG tablet Take 150 mg by mouth 2 (two) times daily. For acid reflux      .  cyanocobalamin (,VITAMIN B-12,) 1000 MCG/ML injection Inject 1,000 mcg into the muscle every 30 (thirty) days.      . magnesium hydroxide (MILK OF MAGNESIA) 400 MG/5ML suspension Take 30 mLs by mouth daily as needed for mild constipation.             Physical Exam: Blood pressure 88/57, pulse 88, temperature 98.7 F (37.1 C), temperature source Oral, resp. rate 18, height 5\' 3"  (1.6 m), weight 43.545  kg (96 lb), SpO2 96.00%.  Gen: no acute distress, light frame HEENT: no palloer, no icterus. Neck: no JVD, No goiter Chest : CTAB CVS: RRR, no murmur/gallop Abd: scaphoid, non tender, positive bowel sounds Ext: no edema Skin: no rash, bruises    Recent Labs  03/22/13 0749  WBC 8.4  HGB 9.5*  HCT 28.6*  MCV 92.0  PLT 239    Recent Labs  03/22/13 0749  NA 142  K 3.4*  CL 109  CO2 17*  GLUCOSE 76  BUN 8  CREATININE 0.77  CALCIUM 8.5   Cortisol: 1.8  No results found for this basename: AST, ALT, ALKPHOS, BILITOT, PROT, ALBUMIN,  in the last 72 hours Recent Results (from the past 240 hour(s))  URINE CULTURE     Status: None   Collection Time    03/18/13  6:15 PM      Result Value Range Status   Specimen Description URINE, CATHETERIZED   Final   Special Requests NONE   Final   Culture  Setup Time     Final   Value: 03/19/2013 02:52     Performed at [facility]     Final   Value: NO GROWTH     Performed at [facility]   Culture     Final   Value: NO GROWTH     Performed at [facility]   Report Status 03/20/2013 FINAL   Final  URINE CULTURE     Status: None   Collection Time    03/19/13  1:15 AM      Result Value Range Status   Specimen Description URINE, RANDOM   Final   Special Requests NONE   Final   Culture  Setup Time     Final   Value: 03/19/2013 21:31     Performed at [facility]     Final   Value: 15,000 COLONIES/ML     Performed at [facility]   Culture     Final   Value: Multiple bacterial morphotypes present, none predominant. Suggest appropriate recollection if clinically indicated.     Performed at [facility]   Report Status 03/20/2013 FINAL   Final    Dg Esophagus  03/08/2013   CLINICAL DATA:  Dysphagia.  EXAM: ESOPHOGRAM/BARIUM SWALLOW  TECHNIQUE: Single contrast examination was performed using  thin barium.  COMPARISON:  02/11/2013  FLUOROSCOPY TIME:  2  min and 18 seconds  FINDINGS: Initial barium swallows demonstrate normal pharyngeal motion with swallowing. No where ischial penetration or aspiration. A tiny Zenker's diverticulum is again demonstrated.  Diffuse esophageal spasm is noted. There are numerous mid esophageal diverticuli. There is also a large hiatal hernia. No GE reflux was demonstrated. The 13 mm barium pill would not pass through the distal esophagus.  IMPRESSION: Tiny Zenker's diverticulum.  Esophageal dysmotility with diffuse esophageal spasm.  Numerous mid esophageal diverticuli.  Large hiatal hernia.  A 13 mm barium pill would not pass through  the esophagus.  No change from recent prior study.   Electronically Signed   By: Kalman Jewels M.D.   On: 03/08/2013 08:20   Dg Chest Portable 1 View  03/18/2013   CLINICAL DATA:  Weakness, dizziness, history pacemaker, GERD  EXAM: PORTABLE CHEST - 1 VIEW  COMPARISON:  02/18/2013  FINDINGS: Left subclavian transvenous pacemaker leads project at right atrium and right ventricle.  Normal heart size, mediastinal contours, and pulmonary vascularity.  Atherosclerotic calcifications aorta.  Calcified granuloma left upper lobe with calcified left hilar adenopathy.  COPD changes with biapical pleuroparenchymal scarring greater on right, stable.  No definite acute infiltrate, pleural effusion or pneumothorax.  Bones appear demineralized.  IMPRESSION: Changes of COPD and old granulomatous disease with stable biapical scarring greater on right.  No acute abnormalities.   Electronically Signed   By: Lavonia Dana M.D.   On: 03/18/2013 16:58   Impression:  Principal Problem:   Orthostatic hypotension Active Problems:   HYPOTHYROIDISM   GASTROESOPHAGEAL REFLUX DISEASE, CHRONIC   Wedge compression fracture of T9 vertebra   Dehydration   Leukocytosis   Generalized weakness   Acute kidney injury   Protein-calorie malnutrition, severe   Hypocortisolism   Plan: Pt's clinical presentation and a lab  finding of significant hypocortisolemia indicates possible adrenal insufficiency. Etiology is unclear at this time. I notice the ongoing ACTH stimulation test to complete work up of Adrenal functional status.  A dose of dexamethasone can be coffered while work up is complete. If adrenal insufficiency is confirmed , she will need steroid support for long term. Her thyroid function is normal. If  Hypotension continues to be an issue , she will be given a mineralocorticoid replacement as well. I will return in AM for reevaluation. Thank you for the consult.   Elorah Dewing 03/22/2013, 11:54 AM

## 2013-03-22 NOTE — Progress Notes (Signed)
Patient ID: Kaitlyn Cooper  female  [id number]    DOB: 1926/04/02    DOA: 03/18/2013  PCP: Sallee Lange, MD  Assessment/Plan: Principal Problem: Acute kidney injury - resolved - Most likely due to history of poor oral intake  - Resolved with IV fluid rehydration, now saline lock  Orthostatic hypotension- still very orthostatic likely due to hypocortisolism - TSH 0.3, B12 807, folate 14.3 with in normal range  - Cortisol 1.8, ordered ACTH cosyntropin stim test, discussed in detail with endocrinology, Dr. Dorris Fetch who agreed with current management, also recommended decadron 1mg  x1 today. Will also see patient today further recommendations. - continue midodrine, ordered TED hoses  - Patient was started on Florinef yesterday, discontinued this morning, however supine BP was quite elevated  - Discussed in detail with patient and her sister at the bedside, she denied being on prolonged steroids. No recent chemotherapy or radiation therapy.  HYPOTHYROIDISM  - continue Synthroid   GASTROESOPHAGEAL REFLUX DISEASE, CHRONIC  - Continue Pepcid   Wedge compression fracture of T9 vertebra  - Continue pain control  Dehydration  - Resolving after fluid rehydration   Leukocytosis  - Resolved after fluid rehydration most likely due to hemoconcentration from dehydration. No active source of infection identified. Chest x-ray reported no acute abnormalities  - UA negative for source   Generalized weakness  - Most likely due to protein calorie malnutrition and dehydration  - Reassess orthostatic vitals every shift   Protein-calorie malnutrition, severe  - Continue ensure supplementation and encouraged increase oral intake  DVT Prophylaxis:  Code Status: DNR  Family Communication: Discussed in detail with the patient and her sister at the bedside  Disposition: Home health PT when ready   Subjective: Patient alert and oriented, still orthostatic, BP falls in 80s on standing. Patient and her  family had been frustrated due to orthostasis x last 2  months which possibly was leading to her falls.   Objective: Weight change:   Intake/Output Summary (Last 24 hours) at 03/22/13 1210 Last data filed at 03/21/13 1749  Gross per 24 hour  Intake    460 ml  Output      0 ml  Net    460 ml   Blood pressure 88/57, pulse 88, temperature 98.7 F (37.1 C), temperature source Oral, resp. rate 18, height 5\' 3"  (1.6 m), weight 43.545 kg (96 lb), SpO2 96.00%.  Physical Exam: General: Alert and awake, oriented, not in any acute distress. CVS: S1-S2 clear Chest: clear to auscultation bilaterally, no wheezing, rales or rhonchi Abdomen: soft nontender, nondistended, normal bowel sounds  Extremities: no cyanosis, clubbing or edema noted bilaterally   Lab Results: Basic Metabolic Panel:  Recent Labs Lab 03/19/13 0519 03/22/13 0749  NA 143 142  K 4.8 3.4*  CL 110 109  CO2 21 17*  GLUCOSE 79 76  BUN 29* 8  CREATININE 1.02 0.77  CALCIUM 8.9 8.5   Liver Function Tests:  Recent Labs Lab 03/18/13 1649  AST 19  ALT 9  ALKPHOS 90  BILITOT 0.3  PROT 8.3  ALBUMIN 3.5   No results found for this basename: LIPASE, AMYLASE,  in the last 168 hours No results found for this basename: AMMONIA,  in the last 168 hours CBC:  Recent Labs Lab 03/18/13 1649 03/19/13 0519 03/22/13 0749  WBC 13.0* 10.3 8.4  NEUTROABS 8.0*  --   --   HGB 10.8* 9.4* 9.5*  HCT 33.9* 29.6* 28.6*  MCV 93.6 95.5 92.0  PLT 344 285  239   Cardiac Enzymes:  Recent Labs Lab 03/18/13 1649  TROPONINI <0.30   BNP: No components found with this basename: POCBNP,  CBG: No results found for this basename: GLUCAP,  in the last 168 hours   Micro Results: Recent Results (from the past 240 hour(s))  URINE CULTURE     Status: None   Collection Time    03/18/13  6:15 PM      Result Value Range Status   Specimen Description URINE, CATHETERIZED   Final   Special Requests NONE   Final   Culture  Setup Time      Final   Value: 03/19/2013 02:52     Performed at [facility]     Final   Value: NO GROWTH     Performed at [facility]   Culture     Final   Value: NO GROWTH     Performed at [facility]   Report Status 03/20/2013 FINAL   Final  URINE CULTURE     Status: None   Collection Time    03/19/13  1:15 AM      Result Value Range Status   Specimen Description URINE, RANDOM   Final   Special Requests NONE   Final   Culture  Setup Time     Final   Value: 03/19/2013 21:31     Performed at [facility]     Final   Value: 15,000 COLONIES/ML     Performed at [facility]   Culture     Final   Value: Multiple bacterial morphotypes present, none predominant. Suggest appropriate recollection if clinically indicated.     Performed at [facility]   Report Status 03/20/2013 FINAL   Final    Studies/Results: Dg Esophagus  03/08/2013   CLINICAL DATA:  Dysphagia.  EXAM: ESOPHOGRAM/BARIUM SWALLOW  TECHNIQUE: Single contrast examination was performed using  thin barium.  COMPARISON:  02/11/2013  FLUOROSCOPY TIME:  2 min and 18 seconds  FINDINGS: Initial barium swallows demonstrate normal pharyngeal motion with swallowing. No where ischial penetration or aspiration. A tiny Zenker's diverticulum is again demonstrated.  Diffuse esophageal spasm is noted. There are numerous mid esophageal diverticuli. There is also a large hiatal hernia. No GE reflux was demonstrated. The 13 mm barium pill would not pass through the distal esophagus.  IMPRESSION: Tiny Zenker's diverticulum.  Esophageal dysmotility with diffuse esophageal spasm.  Numerous mid esophageal diverticuli.  Large hiatal hernia.  A 13 mm barium pill would not pass through the esophagus.  No change from recent prior study.   Electronically Signed   By: Kalman Jewels M.D.   On: 03/08/2013 08:20   Dg Chest Portable 1 View  03/18/2013   CLINICAL DATA:  Weakness, dizziness,  history pacemaker, GERD  EXAM: PORTABLE CHEST - 1 VIEW  COMPARISON:  02/18/2013  FINDINGS: Left subclavian transvenous pacemaker leads project at right atrium and right ventricle.  Normal heart size, mediastinal contours, and pulmonary vascularity.  Atherosclerotic calcifications aorta.  Calcified granuloma left upper lobe with calcified left hilar adenopathy.  COPD changes with biapical pleuroparenchymal scarring greater on right, stable.  No definite acute infiltrate, pleural effusion or pneumothorax.  Bones appear demineralized.  IMPRESSION: Changes of COPD and old granulomatous disease with stable biapical scarring greater on right.  No acute abnormalities.   Electronically Signed   By: Lavonia Dana M.D.   On: 03/18/2013 16:58  Medications: Scheduled Meds: . cholecalciferol  400 Units Oral Daily  . cosyntropin  0.25 mg Intravenous Once  . dexamethasone  1 mg Oral Once  . docusate sodium  100 mg Oral Daily  . enoxaparin (LOVENOX) injection  30 mg Subcutaneous Q24H  . famotidine  20 mg Oral Daily  . feeding supplement (ENSURE COMPLETE)  237 mL Oral TID WC  . levothyroxine  75 mcg Oral QAC breakfast  . megestrol  40 mg Oral Daily  . midodrine  10 mg Oral TID WC  . senna  1 tablet Oral QHS      LOS: 4 days   Cheyrl Buley M.D. [facility] 03/22/2013, 12:10 PM Pager: [phone]  If 7PM-7AM, please contact night-coverage [email or web address] Password [email or web address]

## 2013-03-22 NOTE — Progress Notes (Signed)
*  PRELIMINARY RESULTS* Echocardiogram 2D Echocardiogram has been performed.  Kaitlyn Cooper, [place] 03/22/2013, 10:00 AM

## 2013-03-22 NOTE — Progress Notes (Signed)
Patient changed to inpatient- had poor po intake on admission and was requiring IVF @ 100cc for greater than two midnights.

## 2013-03-23 ENCOUNTER — Inpatient Hospital Stay (HOSPITAL_COMMUNITY): Payer: Medicare Other

## 2013-03-23 ENCOUNTER — Encounter (HOSPITAL_COMMUNITY): Payer: Self-pay | Admitting: Internal Medicine

## 2013-03-23 DIAGNOSIS — D509 Iron deficiency anemia, unspecified: Secondary | ICD-10-CM

## 2013-03-23 DIAGNOSIS — E2740 Unspecified adrenocortical insufficiency: Secondary | ICD-10-CM

## 2013-03-23 DIAGNOSIS — I951 Orthostatic hypotension: Secondary | ICD-10-CM

## 2013-03-23 HISTORY — DX: Unspecified adrenocortical insufficiency: E27.40

## 2013-03-23 LAB — ACTH STIMULATION, 3 TIME POINTS
CORTISOL 60 MIN: 16.2 ug/dL — AB (ref >20)
Cortisol, 30 Min: 12 ug/dL — ABNORMAL LOW (ref >20.0)
Cortisol, Base: 3.4 ug/dL

## 2013-03-23 LAB — BASIC METABOLIC PANEL
BUN: 11 mg/dL (ref 6–23)
CO2: 21 meq/L (ref 19–32)
Calcium: 9.1 mg/dL (ref 8.4–10.5)
Chloride: 110 mEq/L (ref 96–112)
Creatinine, Ser: 0.89 mg/dL (ref 0.50–1.10)
GFR calc Af Amer: 67 mL/min — ABNORMAL LOW (ref >90)
GFR calc non Af Amer: 58 mL/min — ABNORMAL LOW (ref >90)
Glucose, Bld: 92 mg/dL (ref 70–99)
Potassium: 4.4 mEq/L (ref 3.7–5.3)
SODIUM: 143 meq/L (ref 137–147)

## 2013-03-23 LAB — TROPONIN I: Troponin I: <0.3 ng/mL (ref <0.30)

## 2013-03-23 LAB — CORTISOL-AM, BLOOD: CORTISOL - AM: 3.8 ug/dL — AB (ref 4.3–22.4)

## 2013-03-23 MED ORDER — ALUM & MAG HYDROXIDE-SIMETH 200-200-20 MG/5ML PO SUSP
15.0000 mL | Freq: Once | ORAL | Status: AC
  Administered 2013-03-23: 15 mL via ORAL
  Filled 2013-03-23: qty 30

## 2013-03-23 MED ORDER — PREDNISONE 10 MG PO TABS
10.0000 mg | ORAL_TABLET | Freq: Every day | ORAL | Status: DC
  Administered 2013-03-23 – 2013-03-25 (×3): 10 mg via ORAL
  Filled 2013-03-23 (×3): qty 1

## 2013-03-23 NOTE — Progress Notes (Signed)
[facility] PROGRESS NOTE  JAIYA MOORADIAN [id number] DOB: 24-May-1926 DOA: 03/18/2013 PCP: Sallee Lange, MD    Code Status: DO NOT RESUSCITATE Family Communication: Discussed with the patient's sister. Disposition Plan: To be determined.   Consultants:  Endocrinologist, Dr.Nida  Procedures:  None  Antibiotics:  None  HPI/Subjective: The patient is lying in bed, with no new complaints, but she acknowledges lightheadedness when she stands or sits up. Her sister is in the room. Questions are answered.  Objective: Filed Vitals:   03/23/13 1253  BP: 91/53  Pulse: 98  Temp:   Resp:    blood pressure supine 150/55; blood pressure sitting 119/65; blood pressure standing 91/53. Temperature 97.9. Oxygen saturation 96% on room air.    Intake/Output Summary (Last 24 hours) at 03/23/13 1348 Last data filed at 03/23/13 1257  Gross per 24 hour  Intake    350 ml  Output      0 ml  Net    350 ml   Filed Weights   03/18/13 1629  Weight: 43.545 kg (96 lb)    Exam:   General:  Pleasant elderly 80 year old woman, in no acute distress.  Cardiovascular: S1, S2, with no murmurs rubs or gallops.  Respiratory: Clear to auscultation bilaterally.  Abdomen: Positive bowel sounds, soft, nontender, nondistended.  Musculoskeletal: No acute hot red joints.  Psychiatric/neurologic: She has a pleasant affect. Her speech is clear. She is alert and oriented x2. Cranial nerves II through XII are grossly intact.   Data Reviewed: Basic Metabolic Panel:  Recent Labs Lab 03/18/13 1649 03/19/13 0519 03/22/13 0749 03/23/13 0635  NA 141 143 142 143  K 4.2 4.8 3.4* 4.4  CL 103 110 109 110  CO2 21 21 17* 21  GLUCOSE 99 79 76 92  BUN 35* 29* 8 11  CREATININE 1.19* 1.02 0.77 0.89  CALCIUM 9.7 8.9 8.5 9.1   Liver Function Tests:  Recent Labs Lab 03/18/13 1649  AST 19  ALT 9  ALKPHOS 90  BILITOT 0.3  PROT 8.3  ALBUMIN 3.5   No results found for this basename:  LIPASE, AMYLASE,  in the last 168 hours No results found for this basename: AMMONIA,  in the last 168 hours CBC:  Recent Labs Lab 03/18/13 1649 03/19/13 0519 03/22/13 0749  WBC 13.0* 10.3 8.4  NEUTROABS 8.0*  --   --   HGB 10.8* 9.4* 9.5*  HCT 33.9* 29.6* 28.6*  MCV 93.6 95.5 92.0  PLT 344 285 239   Cardiac Enzymes:  Recent Labs Lab 03/18/13 1649  TROPONINI <0.30   BNP (last 3 results) No results found for this basename: PROBNP,  in the last 8760 hours CBG: No results found for this basename: GLUCAP,  in the last 168 hours  Recent Results (from the past 240 hour(s))  URINE CULTURE     Status: None   Collection Time    03/18/13  6:15 PM      Result Value Range Status   Specimen Description URINE, CATHETERIZED   Final   Special Requests NONE   Final   Culture  Setup Time     Final   Value: 03/19/2013 02:52     Performed at [place]     Final   Value: NO GROWTH     Performed at [facility]   Culture     Final   Value: NO GROWTH     Performed at [facility]   Report Status 03/20/2013  FINAL   Final  URINE CULTURE     Status: None   Collection Time    03/19/13  1:15 AM      Result Value Range Status   Specimen Description URINE, RANDOM   Final   Special Requests NONE   Final   Culture  Setup Time     Final   Value: 03/19/2013 21:31     Performed at [place]     Final   Value: 15,000 COLONIES/ML     Performed at [facility]   Culture     Final   Value: Multiple bacterial morphotypes present, none predominant. Suggest appropriate recollection if clinically indicated.     Performed at [facility]   Report Status 03/20/2013 FINAL   Final     Studies: No results found.  Scheduled Meds: . cholecalciferol  400 Units Oral Daily  . docusate sodium  100 mg Oral Daily  . enoxaparin (LOVENOX) injection  30 mg Subcutaneous Q24H  . famotidine  20 mg Oral Daily  . feeding  supplement (ENSURE COMPLETE)  237 mL Oral TID WC  . levothyroxine  75 mcg Oral QAC breakfast  . midodrine  10 mg Oral TID WC  . predniSONE  10 mg Oral Daily  . senna  1 tablet Oral QHS   Continuous Infusions:   Assessment: Principal Problem:   Orthostatic hypotension Active Problems:   Hypocortisolism   Adrenal insufficiency   HYPOTHYROIDISM   GASTROESOPHAGEAL REFLUX DISEASE, CHRONIC   Wedge compression fracture of T9 vertebra   Dehydration   Leukocytosis   Generalized weakness   Acute kidney injury   Protein-calorie malnutrition, severe    1. Severe orthostatic hypotension, secondary to adrenal insufficiency. She was dehydrated earlier in the hospitalization, but she is now euvolemic status post IV fluids. She was on Florinef, but this was discontinued in favor of Midodrine. Steroid replacement therapy was also started. She is also wearing TED hose. Her orthostatic blood pressure is improving. We'll discharge her when she is less orthostatic and less symptomatic.  2. Adrenal insufficiency. The patient's cortisol was 1.8. Per Dr. Dorris Fetch, the patient has adrenal insufficiency, status post ACTH stimulation test.  The patient received one dose of Decadron a couple days ago. He started prednisone this morning and discontinued Megace.  3. Hypothyroidism. Her TSH is within normal limits. Continue Synthroid.  4. Acute renal injury, secondary to dehydration and hypovolemia. Status post IV fluids. Her creatinine has normalized.  5. Dehydration. Resolved with IV fluids. 6. Leukocytosis, likely secondary to hemoconcentration from dehydration. No active source of infection was identified i.e., chest x-ray revealed no infiltrates and a urinalysis was without WBCs. 7. Protein calorie malnutrition, severe. Continue Ensure supplement and encouraged to increase oral intake. 8. Generalized weakness/likely deconditioning. Physical therapy evaluated the patient, however she didn't ambulate because of  orthostatic hypotension. She will likely need home physical therapy. We'll await the therapist recommendations.    Plan: 1. Continue current management. 2. Will discharge when she is less symptomatic and less orthostatic.  Time spent: 30 minutes.    [facility] Pager [phone]. If 7PM-7AM, please contact night-coverage at [email or web address], password [facility] 03/23/2013, 1:48 PM  LOS: 5 days

## 2013-03-23 NOTE — Progress Notes (Signed)
Pt began complaining of feeling hot, then cold. VS were assessed and temperature was WNL. Pt then stated that she was having left chest pain, radiating to her back. This has occurred occasionally for the last couple of weeks, per pt. Dr. Caryn Section was notified, order received for CXR, 12lead EKG, Maalox & troponin level to be checked. Maalox was given, as well as po pain medication. Marry Guan

## 2013-03-23 NOTE — Progress Notes (Signed)
Physical Therapy Treatment Patient Details Name: Kaitlyn Cooper MRN: [id number] DOB: [birth year] Today's Date: 03/23/2013 Time: 3212-2482 PT Time Calculation (min): 23 min 1 TE  PT Assessment / Plan / Recommendation        Patient still experiencing orthostatic hypotension, with lots of dizziness with position changes Lying: 151/81 Sitting: 126/70 (as low as 106/62) Standing: 83/43  Patient was able to sit EOB and complete therex with needed rest breaks. Ambulation not attempt as standing BP was 83/43 and patient very dizzy. Wanted patient to sit in recliner however pt wanted to lie down and became very dizzy having to hold on to therapist when first supine BP 155/67                                  Progress towards PT Goals    Plan      Precautions / Restrictions Restrictions Weight Bearing Restrictions: No       Mobility  Bed Mobility Overal bed mobility: Modified Independent Bed Mobility: Supine to Sit Supine to sit: HOB elevated;Modified independent (Device/Increase time) Sit to supine: HOB elevated;Modified independent (Device/Increase time) Transfers Overall transfer level: Needs assistance Equipment used: Rolling walker (2 wheeled) Transfers: Sit to/from Stand Sit to Stand: Supervision General transfer comment: patient became dizzy with standing    Exercises General Exercises - Upper Extremity Shoulder Flexion: AROM;Seated;Both;10 reps Shoulder Horizontal ABduction: AROM;Both;10 reps;Seated Shoulder Horizontal ADduction: 10 reps;Both;AROM;Seated General Exercises - Lower Extremity Long Arc Quad: AROM;Seated;Both;10 reps Hip ABduction/ADduction: AROM;Both;10 reps;Seated Hip Flexion/Marching: AROM;Seated;Both;10 reps   PT Diagnosis:    PT Problem List:   PT Treatment Interventions:     PT Goals (current goals can now be found in the care plan section)    Visit Information  Last PT Received On: 03/23/13 Assistance Needed: +1    Subjective Data       Cognition       Balance     End of Session PT - End of Session Equipment Utilized During Treatment: Gait belt Activity Tolerance: Treatment limited secondary to medical complications (Comment) (orthostatic hypotension prevented ambulation or standing)   GP     Ameli Sangiovanni, [place] 03/23/2013, 12:46 PM

## 2013-03-23 NOTE — Progress Notes (Signed)
NUTRITION FOLLOW UP  Intervention:   Continue with current nutrition plan of care  Nutrition Dx:   Inadequate oral intake related to decreased appetite as evidenced by PO: 50%, fat and muscle loss; ongoing  Goal:   Pt will meet nutrition needs as able  Monitor:   PO and supplement intake, skin assessments, labs, weight changes, follow for goals of care  Assessment:   Appetite is improving; PO: 50-75%. Additionally, pt also receiving Ensure Complete po BID, each supplement provides 350 kcal and 13 grams of protein.  No new wt since last visit, so unable to assess weight changes.  Noted that hospice consult has been entered for supportive care. Per RNCM note, pt sister reports that pt would like to return home to die. Also requesting resuming [facility] services, as pt family unable to care for pt themselves.  Will continue to follow for goals of care.   Height: Ht Readings from Last 1 Encounters:  03/18/13 5\' 3"  (1.6 m)    Weight Status:   Wt Readings from Last 1 Encounters:  03/18/13 96 lb (43.545 kg)    Re-estimated needs:  Kcal: 6387-5643 daily Protein: 54-65 grams daily Fluid: 1.3-1.5 L daily  Skin: Intact  Diet Order: Cardiac   Intake/Output Summary (Last 24 hours) at 03/23/13 1055 Last data filed at 03/23/13 0900  Gross per 24 hour  Intake    120 ml  Output      0 ml  Net    120 ml    Last BM: 03/21/13   Labs:   Recent Labs Lab 03/19/13 0519 03/22/13 0749 03/23/13 0635  NA 143 142 143  K 4.8 3.4* 4.4  CL 110 109 110  CO2 21 17* 21  BUN 29* 8 11  CREATININE 1.02 0.77 0.89  CALCIUM 8.9 8.5 9.1  GLUCOSE 79 76 92    CBG (last 3)  No results found for this basename: GLUCAP,  in the last 72 hours  Scheduled Meds: . cholecalciferol  400 Units Oral Daily  . docusate sodium  100 mg Oral Daily  . enoxaparin (LOVENOX) injection  30 mg Subcutaneous Q24H  . famotidine  20 mg Oral Daily  . feeding supplement (ENSURE COMPLETE)  237 mL Oral TID WC  .  levothyroxine  75 mcg Oral QAC breakfast  . midodrine  10 mg Oral TID WC  . predniSONE  10 mg Oral Daily  . senna  1 tablet Oral QHS    Continuous Infusions:   Eldredge Veldhuizen A. Jimmye Norman, RD, LDN Pager: [phone]

## 2013-03-23 NOTE — Progress Notes (Signed)
Subjective: F/u for adrenal insufficiency. She is s/p ACTH stimulation test. She received a  Dose of Dexamethasone yesterday. She feels better.  Objective: Vital signs in last 24 hours: Filed Vitals:   03/22/13 2023 03/23/13 0419 03/23/13 0421 03/23/13 0425  BP: 131/53 126/70 98/61 72/43   Pulse: 63 63 79 117  Temp: 97.7 F (36.5 C) 97.7 F (36.5 C)    TempSrc: Oral Oral    Resp: 18 18 18 18   Height:      Weight:      SpO2: 96% 96% 96% 96%   No intake or output data in the 24 hours ending 03/23/13 0916 Weight change:   Gen: no acute distress, light frame  HEENT: no palloer, no icterus.  Neck: no JVD, No goiter  Chest : CTAB  CVS: RRR, no murmur/gallop  Abd: scaphoid, non tender, positive bowel sounds  Ext: no edema  Skin: no rash, bruises   Lab Results: Basic Metabolic Panel:  Recent Labs  03/22/13 0749 03/23/13 0635  NA 142 143  K 3.4* 4.4  CL 109 110  CO2 17* 21  GLUCOSE 76 92  BUN 8 11  CREATININE 0.77 0.89  CALCIUM 8.5 9.1   Liver Function Tests: No results found for this basename: AST, ALT, ALKPHOS, BILITOT, PROT, ALBUMIN,  in the last 72 hours No results found for this basename: LIPASE, AMYLASE,  in the last 72 hours CBC:  Recent Labs  03/22/13 0749  WBC 8.4  HGB 9.5*  HCT 28.6*  MCV 92.0  PLT 239     Recent Labs  03/21/13 1147  VITAMINB12 807  FOLATE 14.3    Studies/Results:   ACTH Stimulation Test Result   Results for Kaitlyn Cooper, Kaitlyn Cooper (MRN [id number]) as of 03/23/2013 09:17  Ref. Range 03/22/2013 12:10  Cortisol, Base No range found 3.4  Cortisol, 30 Min Latest Range: >20.0 ug/dL 12.0 (L)  Cortisol, 60 Min Latest Range: >20 ug/dL 16.2 (L)    Medications:  Scheduled Meds: . cholecalciferol  400 Units Oral Daily  . docusate sodium  100 mg Oral Daily  . enoxaparin (LOVENOX) injection  30 mg Subcutaneous Q24H  . famotidine  20 mg Oral Daily  . feeding supplement (ENSURE COMPLETE)  237 mL Oral TID WC  . levothyroxine  75 mcg Oral  QAC breakfast  . midodrine  10 mg Oral TID WC  . prednisoLONE  10 mg Oral Daily  . senna  1 tablet Oral QHS   Continuous Infusions:  PRN Meds:.acetaminophen, acetaminophen, alum & mag hydroxide-simeth, bisacodyl, HYDROcodone-acetaminophen, magnesium hydroxide, ondansetron (ZOFRAN) IV, ondansetron, oxyCODONE  Assessment/Plan:   Adrenal Insufficiency Principal Problem:   Orthostatic hypotension Active Problems:   HYPOTHYROIDISM   GASTROESOPHAGEAL REFLUX DISEASE, CHRONIC   Wedge compression fracture of T9 vertebra   Dehydration   Leukocytosis   Generalized weakness   Acute kidney injury   Protein-calorie malnutrition, severe   Hypocortisolism   Plan:  Pt's clinical presentation and a lab finding of abnormal ACTH stimulation test confirms adrenal insufficiency, albeit partial.  Etiology is unclear at this time. She has clinically responded well to the one  dose of Dexamethasone given yesterday. She will continue to benefit from steroid support for long term. i will initiate Prednisone 10mg  po qam, d/c megace.  Her TSH is indicative of adequate replacement , continue LT4 75 mcg po qam. If Hypotension continues to be an issue , she will be given a mineralocorticoid replacement as well as out patient. I will see her in office  in 2 weeks for reevaluation.  Thank you for the consult.    LOS: 5 days   Kaitlyn Cooper 03/23/2013, 9:16 AM

## 2013-03-24 DIAGNOSIS — K5289 Other specified noninfective gastroenteritis and colitis: Secondary | ICD-10-CM | POA: Diagnosis not present

## 2013-03-24 DIAGNOSIS — IMO0002 Reserved for concepts with insufficient information to code with codable children: Secondary | ICD-10-CM | POA: Diagnosis not present

## 2013-03-24 DIAGNOSIS — D649 Anemia, unspecified: Secondary | ICD-10-CM | POA: Diagnosis not present

## 2013-03-24 DIAGNOSIS — H8110 Benign paroxysmal vertigo, unspecified ear: Secondary | ICD-10-CM

## 2013-03-24 DIAGNOSIS — I951 Orthostatic hypotension: Secondary | ICD-10-CM | POA: Diagnosis not present

## 2013-03-24 LAB — CBC
HCT: 30.3 % — ABNORMAL LOW (ref 36.0–46.0)
HEMOGLOBIN: 9.9 g/dL — AB (ref 12.0–15.0)
MCH: 30.7 pg (ref 26.0–34.0)
MCHC: 32.7 g/dL (ref 30.0–36.0)
MCV: 93.8 fL (ref 78.0–100.0)
Platelets: 294 10*3/uL (ref 150–400)
RBC: 3.23 MIL/uL — AB (ref 3.87–5.11)
RDW: 16.5 % — ABNORMAL HIGH (ref 11.5–15.5)
WBC: 8.3 10*3/uL (ref 4.0–10.5)

## 2013-03-24 LAB — BASIC METABOLIC PANEL
BUN: 16 mg/dL (ref 6–23)
CO2: 22 meq/L (ref 19–32)
Calcium: 9.1 mg/dL (ref 8.4–10.5)
Chloride: 107 mEq/L (ref 96–112)
Creatinine, Ser: 0.92 mg/dL (ref 0.50–1.10)
GFR calc Af Amer: 64 mL/min — ABNORMAL LOW (ref >90)
GFR calc non Af Amer: 56 mL/min — ABNORMAL LOW (ref >90)
GLUCOSE: 96 mg/dL (ref 70–99)
POTASSIUM: 4.3 meq/L (ref 3.7–5.3)
SODIUM: 141 meq/L (ref 137–147)

## 2013-03-24 MED ORDER — MECLIZINE HCL 12.5 MG PO TABS
12.5000 mg | ORAL_TABLET | Freq: Three times a day (TID) | ORAL | Status: DC | PRN
  Administered 2013-03-24: 12.5 mg via ORAL
  Filled 2013-03-24: qty 1

## 2013-03-24 MED ORDER — ADULT MULTIVITAMIN W/MINERALS CH
1.0000 | ORAL_TABLET | Freq: Every day | ORAL | Status: DC
  Administered 2013-03-25: 1 via ORAL
  Filled 2013-03-24: qty 1

## 2013-03-24 MED ORDER — MIDODRINE HCL 5 MG PO TABS
5.0000 mg | ORAL_TABLET | Freq: Three times a day (TID) | ORAL | Status: DC
  Administered 2013-03-25 (×2): 5 mg via ORAL
  Filled 2013-03-24 (×11): qty 1

## 2013-03-24 MED ORDER — FAMOTIDINE 20 MG PO TABS
20.0000 mg | ORAL_TABLET | Freq: Two times a day (BID) | ORAL | Status: DC
  Administered 2013-03-24 – 2013-03-25 (×2): 20 mg via ORAL
  Filled 2013-03-24 (×2): qty 1

## 2013-03-24 NOTE — Progress Notes (Signed)
Physical Therapy Treatment Patient Details Name: Kaitlyn Cooper MRN: [id number] DOB: Apr 30, 1926 Today's Date: 03/24/2013 Time: 9480-1655 PT Time Calculation (min): 24 min  PT Assessment / Plan / Recommendation  History of Present Illness Pt has no specific c/o but is tearful at times, missing her deceased husband of 89 years.   PT Comments   Pt was alert and cooperative (although not enthusiastic) with PT.  She was able to perform basic strengthening exercise in supine with no difficulty.  She sat at EOB and initially c/o severe dizziness.  She was encouraged to move her LEs while seated to facilitate vascular supply.  The dizziness subsided within about 2 minutes.  She was then able to ambulate 200' with a walker and no instability.  She experienced no dizziness during this time.  She was instructed in strategies to deal with orthostasis and was cautioned to not stay in bed all day at home.   She appears to understand.  Follow Up Recommendations  Home health PT     Does the patient have the potential to tolerate intense rehabilitation     Barriers to Discharge        Equipment Recommendations       Recommendations for Other Services    Frequency     Progress towards PT Goals Progress towards PT goals: Goals met and updated - see care plan;PT to reassess next treatment  Plan Current plan remains appropriate    Precautions / Restrictions     Pertinent Vitals/Pain     Mobility  Bed Mobility Supine to sit: Modified independent (Device/Increase time) Transfers Equipment used: Rolling walker (2 wheeled) Sit to Stand: Supervision Ambulation/Gait Ambulation/Gait assistance: Supervision Ambulation Distance (Feet): 200 Feet Assistive device: Rolling walker (2 wheeled) Gait Pattern/deviations: WFL(Within Functional Limits) Gait velocity interpretation: at or above normal speed for age/gender General Gait Details: no gait instabiltiy    Exercises General Exercises - Lower  Extremity Ankle Circles/Pumps: AROM;Both;10 reps;Supine Heel Slides: AROM;Both;10 reps;Supine Straight Leg Raises: AROM;Both;10 reps;Supine   PT Diagnosis:    PT Problem List:   PT Treatment Interventions:     PT Goals (current goals can now be found in the care plan section)    Visit Information  Last PT Received On: 03/24/13 History of Present Illness: Pt has no specific c/o but is tearful at times, missing her deceased husband of 5 years.    Subjective Data      Cognition       Balance     End of Session PT - End of Session Equipment Utilized During Treatment: Gait belt Activity Tolerance: Patient tolerated treatment well Patient left: in chair;with call bell/phone within reach;with family/visitor present Nurse Communication: Mobility status   GP     [name] 03/24/2013, 4:25 PM

## 2013-03-24 NOTE — Progress Notes (Signed)
[facility] PROGRESS NOTE  Kaitlyn Cooper LPF:[id number] DOB: 07-Oct-1926 DOA: 03/18/2013 PCP: Sallee Lange, MD    Code Status: DO NOT RESUSCITATE Family Communication: Discussed with the patient's sister. Disposition Plan: To be determined.   Consultants:  Endocrinologist, Dr.Nida  Procedures:  None  Antibiotics:  None  HPI/Subjective: The patient had a complaint of right-sided upper abdominal/chest pain yesterday afternoon. It resolved with analgesics and Maalox. She has no complaints other than occasional dizziness when she turns her head and when she stands up. It is briefly a spinning dizziness.  Objective: Filed Vitals:   03/24/13 1420  BP: 173/78  Pulse: 64  Temp: 98.2 F (36.8 C)  Resp: 18   blood pressure supine 170/72. Blood pressure standing 100/62. Blood pressure standing 96/61. Heart rate 65>>> 85.    Intake/Output Summary (Last 24 hours) at 03/24/13 1708 Last data filed at 03/24/13 1200  Gross per 24 hour  Intake    480 ml  Output      0 ml  Net    480 ml   Filed Weights   03/18/13 1629  Weight: 43.545 kg (96 lb)    Exam:   General:  Pleasant elderly 80 year old woman, in no acute distress.  Cardiovascular: S1, S2, with no murmurs rubs or gallops.  Respiratory: Clear to auscultation bilaterally.  Abdomen: Positive bowel sounds, soft, nontender, nondistended.  Musculoskeletal: No acute hot red joints.  Psychiatric/neurologic: She has a pleasant affect. Her speech is clear. She is alert and oriented x2. Cranial nerves II through XII are grossly intact. No nystagmus.  Data Reviewed: Basic Metabolic Panel:  Recent Labs Lab 03/18/13 1649 03/19/13 0519 03/22/13 0749 03/23/13 0635 03/24/13 0608  NA 141 143 142 143 141  K 4.2 4.8 3.4* 4.4 4.3  CL 103 110 109 110 107  CO2 21 21 17* 21 22  GLUCOSE 99 79 76 92 96  BUN 35* 29* 8 11 16   CREATININE 1.19* 1.02 0.77 0.89 0.92  CALCIUM 9.7 8.9 8.5 9.1 9.1   Liver Function  Tests:  Recent Labs Lab 03/18/13 1649  AST 19  ALT 9  ALKPHOS 90  BILITOT 0.3  PROT 8.3  ALBUMIN 3.5   No results found for this basename: LIPASE, AMYLASE,  in the last 168 hours No results found for this basename: AMMONIA,  in the last 168 hours CBC:  Recent Labs Lab 03/18/13 1649 03/19/13 0519 03/22/13 0749 03/24/13 0608  WBC 13.0* 10.3 8.4 8.3  NEUTROABS 8.0*  --   --   --   HGB 10.8* 9.4* 9.5* 9.9*  HCT 33.9* 29.6* 28.6* 30.3*  MCV 93.6 95.5 92.0 93.8  PLT 344 285 239 294   Cardiac Enzymes:  Recent Labs Lab 03/18/13 1649 03/23/13 2025  TROPONINI <0.30 <0.30   BNP (last 3 results) No results found for this basename: PROBNP,  in the last 8760 hours CBG: No results found for this basename: GLUCAP,  in the last 168 hours  Recent Results (from the past 240 hour(s))  URINE CULTURE     Status: None   Collection Time    03/18/13  6:15 PM      Result Value Range Status   Specimen Description URINE, CATHETERIZED   Final   Special Requests NONE   Final   Culture  Setup Time     Final   Value: 03/19/2013 02:52     Performed at [facility]     Final   Value: NO GROWTH  Performed at [facility]     Final   Value: NO GROWTH     Performed at [facility]   Report Status 03/20/2013 FINAL   Final  URINE CULTURE     Status: None   Collection Time    03/19/13  1:15 AM      Result Value Range Status   Specimen Description URINE, RANDOM   Final   Special Requests NONE   Final   Culture  Setup Time     Final   Value: 03/19/2013 21:31     Performed at [facility]     Final   Value: 15,000 COLONIES/ML     Performed at [facility]   Culture     Final   Value: Multiple bacterial morphotypes present, none predominant. Suggest appropriate recollection if clinically indicated.     Performed at [facility]   Report Status 03/20/2013 FINAL   Final     Studies: Dg Chest  Port 1 View  03/23/2013   CLINICAL DATA:  Left chest pain  EXAM: PORTABLE CHEST - 1 VIEW  COMPARISON:  03/18/2013  FINDINGS: Left subclavian pacemaker stable. Mild cardiomegaly. Stable left mid lung calcified granuloma and calcified left hilar lymph nodes. Lungs otherwise clear. . No effusion. Visualized skeletal structures are unremarkable. Surgical clips right upper abdomen.  IMPRESSION: No acute cardiopulmonary disease.   Electronically Signed   By: Arne Cleveland M.D.   On: 03/23/2013 20:04    Scheduled Meds: . cholecalciferol  400 Units Oral Daily  . docusate sodium  100 mg Oral Daily  . enoxaparin (LOVENOX) injection  30 mg Subcutaneous Q24H  . famotidine  20 mg Oral Daily  . feeding supplement (ENSURE COMPLETE)  237 mL Oral TID WC  . levothyroxine  75 mcg Oral QAC breakfast  . midodrine  10 mg Oral TID WC  . predniSONE  10 mg Oral Daily  . senna  1 tablet Oral QHS   Continuous Infusions:   Assessment: Principal Problem:   Orthostatic hypotension Active Problems:   Hypocortisolism   Adrenal insufficiency   HYPOTHYROIDISM   GASTROESOPHAGEAL REFLUX DISEASE, CHRONIC   Wedge compression fracture of T9 vertebra   Dehydration   Leukocytosis   Generalized weakness   Acute kidney injury   Protein-calorie malnutrition, severe    1. Severe orthostatic hypotension, secondary to adrenal insufficiency. She was dehydrated earlier in the hospitalization, but she is now euvolemic status post IV fluids. She was on Florinef, but this was discontinued in favor of Midodrine. Steroid replacement therapy was also started. She is also wearing TED hose. Her orthostatic blood pressure is improving and is consistently above 90 systolically the last 2 readings.. Her resting blood pressure is hypertensive. Will adjust midodrine.  2. Adrenal insufficiency. The patient's cortisol was 1.8. Per Dr. Dorris Fetch, the patient has adrenal insufficiency, status post ACTH stimulation test.  The patient received one  dose of Decadron a couple days ago. He started prednisone and discontinued Megace.  3. Hypothyroidism. Her TSH is within normal limits. Continue Synthroid.  4. Acute renal injury, secondary to dehydration and hypovolemia. Status post IV fluids. Her creatinine has normalized.  5. Dehydration. Resolved with IV fluids. 6. Leukocytosis, likely secondary to hemoconcentration from dehydration. No active source of infection was identified i.e., chest x-ray revealed no infiltrates and a urinalysis was without WBCs. 7. Protein calorie malnutrition, severe. Continue Ensure supplement and encouraged to increase oral intake. 8.  Generalized weakness/likely deconditioning. Physical therapy evaluated the patient, however she didn't ambulate because of orthostatic hypotension. She will likely need home physical therapy. We'll await the therapist recommendations.  9. Dizziness, possibly secondary to benign positional vertigo. We'll add when necessary meclizine. 10. Anemia. Her vitamin B12, folate, and TSH are within normal limits. Her total iron in January/2015 revealed mild iron deficiency; her ferritin was within normal limits at 82; and her erythropoietin was within normal limits at 14.9.Marland KitchenMarland Kitchen We'll add a multivitamin with iron. 11. Chest pain/epigastric discomfort. Troponin I was negative. EKG revealed paced rhythm. Chest x-ray revealed no acute cardiopulmonary disease. Etiology likely gastrointestinal in origin. Will increase Pepcid to twice a day.    Plan: 1. Continue current management. 2. Add when necessary meclizine. Change Pepcid twice a day. 3. Decrease  Midodrine to 5 mg Q8 hours. 4. Add multivitamin with iron. 5. Possible discharge tomorrow.   Time spent: 30 minutes.    [facility] Pager [phone]. If 7PM-7AM, please contact night-coverage at [email or web address], password [facility] 03/24/2013, 5:08 PM  LOS: 6 days

## 2013-03-25 ENCOUNTER — Encounter (HOSPITAL_COMMUNITY): Payer: Self-pay | Admitting: Internal Medicine

## 2013-03-25 LAB — BASIC METABOLIC PANEL
BUN: 21 mg/dL (ref 6–23)
CALCIUM: 9.3 mg/dL (ref 8.4–10.5)
CO2: 24 meq/L (ref 19–32)
Chloride: 105 mEq/L (ref 96–112)
Creatinine, Ser: 0.91 mg/dL (ref 0.50–1.10)
GFR, EST AFRICAN AMERICAN: 65 mL/min — AB (ref >90)
GFR, EST NON AFRICAN AMERICAN: 56 mL/min — AB (ref >90)
Glucose, Bld: 87 mg/dL (ref 70–99)
Potassium: 4.3 mEq/L (ref 3.7–5.3)
SODIUM: 140 meq/L (ref 137–147)

## 2013-03-25 MED ORDER — MECLIZINE HCL 12.5 MG PO TABS: 12.5000 mg | ORAL_TABLET | Freq: Three times a day (TID) | ORAL | Status: DC | PRN

## 2013-03-25 MED ORDER — MIDODRINE HCL 5 MG PO TABS: 5.0000 mg | ORAL_TABLET | Freq: Three times a day (TID) | ORAL | Status: DC

## 2013-03-25 MED ORDER — ENSURE COMPLETE PO LIQD: 237.0000 mL | Freq: Three times a day (TID) | ORAL | Status: DC

## 2013-03-25 MED ORDER — ADULT MULTIVITAMIN W/MINERALS CH
1.0000 | ORAL_TABLET | Freq: Every day | ORAL | Status: DC
Start: 2013-03-25 — End: 2013-05-02

## 2013-03-25 MED ORDER — PREDNISONE 10 MG PO TABS: 10.0000 mg | ORAL_TABLET | Freq: Every day | ORAL | Status: AC

## 2013-03-25 MED ORDER — SENNA 8.6 MG PO TABS: 1.0000 | ORAL_TABLET | Freq: Every evening | ORAL | Status: DC | PRN

## 2013-03-25 NOTE — Progress Notes (Signed)
D/c instructions reviewed with patient's sister.  Verbalized understanding.  Pt dc'd to home with sister. Schonewitz, Eulis Canner 03/25/2013

## 2013-03-25 NOTE — Discharge Summary (Signed)
Physician Discharge Summary  Kaitlyn Cooper GYK:[id number] DOB: 10-May-1926 DOA: 03/18/2013  PCP: Sallee Lange, MD  Admit date: 03/18/2013 Discharge date: 03/25/2013  Time spent: Greater than 30 minutes minutes  Recommendations for Outpatient Follow-up:  1. Home health physical therapy, occupational therapy, and registered nurse were ordered.  Discharge Diagnoses:  1. Severe orthostatic hypotension, secondary to hypocortisolism/adrenal insufficiency. 2. Adrenal insufficiency, per abnormal ACTH stimulation test, new diagnosis. 3. Benign positional vertigo. 4. Hypothyroidism. 5. Dehydration. 6. Acute renal injury, secondary to prerenal azotemia. Resolved. 7. Severe protein calorie malnutrition. Treated with supplements. 8. Leukocytosis, thought to be secondary to volume contraction. Resolved without antibiotics. 9. Gastroesophageal reflux disease. 10. Generalized weakness, multifactorial including all of the above. 11. Recent history of wedge compression fracture of the T9 vertebrae. 12. History of pacemaker insertion.  Discharge Condition: Improved.  Diet recommendation: Regular as tolerated.  Filed Weights   03/18/13 1629  Weight: 43.545 kg (96 lb)    History of present illness:  Patient is 80 year old female with history of falls, recent T9 compression fracture, history of orthostatic hypotension on midodrine, depression,  UTI, and intermittent problems with dysphagia due to esophageal stricture, presented to [facility] ER with generalized weakness, dizziness and not eating well in the last 5 days. History was obtained from the patient's family members in the room who stated that after the previous admission in 12/14, she was discharged to [facility] rehab. She was recently discharged to home on 03/14/13. She had not been eating at all, had been feeling constipated and dizzy. Patient was started on Megace suspension outpatient but she had been refusing the liquid. The patient's family  members had been having difficulty with taking care of her.  In the ER, the patient was noticed to have leukocytosis with white count of 13, creatinine 1.19 and  BUN of 35. No chest pain or any fever, chills, dysuria or hematuria, or abdominal pain   Hospital Course:   1. Severe orthostatic hypotension, secondary to adrenal insufficiency.  The patient appeared to be volume contracted based on physical examination and clinical findings. She was found to be dizzy and presyncopal, particularly when she stood up or sat up. This was thought to be secondary to volume depletion/dehydration. She was severely orthostatic with her blood pressure falling to the 79T systolically when standing. She has a known history of orthostatic hypotension and had been treated with midodrine. Florinef was started for couple days, but then it was discontinued in favor of increasing the dose of Midodrin. TED hose were applied. With ongoing orthostatic hypotension, additional studies were ordered. Her TSH was within normal limits at 0.350. Her cardiac enzymes were negative. Her 2-D echocardiogram revealed an ejection fraction of 90-30%, grade 1 diastolic dysfunction, and ventricular septum dyssynergy, consistent with right ventricular pacing. Her cortisol level was low at 1.8. Given this finding, endocrinologist, Dr. Dorris Fetch was consulted. He ordered a dose of dexamethasone and then an ACTH stimulation test. Eventually, the test revealed that she had adrenal insufficiency, albeit partial. He then started her on daily prednisone at 10 mg. Her sitting/supine blood pressure began to increase to the 170s to 180s on 10 mg of Midodrin 3 times daily. Her orthostatic hypotension was significantly less. At the time of discharge, the dose of Midodrin was decreased to the prehospital dose secondary to accelerated blood pressure at rest. Hopefully with treatment of her adrenal insufficiency, she will require less and less of Midodrin. She will be seen  by Dr. Dorris Fetch in the next couple  weeks for follow up evaluation. Home health physical therapy and occupational therapy were ordered.  Hypothyroidism. Her TSH was within normal limits. She was continued on Synthroid.   Acute renal injury, secondary to dehydration and hypovolemia. She was hydrated with IV fluids. Her creatinine hah normalized.    Leukocytosis, likely secondary to hemoconcentration from dehydration. No active source of infection was identified i.e., chest x-ray revealed no infiltrates and her urinalysis was without WBCs. She remained afebrile. Her white blood cell count normalized with hydration and supportive treatment.   Protein calorie malnutrition, severe. The registered dietitian recommended Ensure supplements and encouraged the patient to increase her oral intake. Megace which had been started in the outpatient setting was discontinued by endocrinology.     Generalized weakness/likely deconditioning. Physical therapy evaluated the patient and recommended home health physical therapy and occupational therapy. These were ordered. She was recently discharged from the [facility] for rehabilitation.    Dizziness, possibly secondary to benign positional vertigo and orthostasis.  As needed meclizine was ordered. She was advised to not turn her head abruptly and to stand slowly.    Anemia. Her vitamin B12, folate, and TSH were within normal limits. Her total iron in January/2015 revealed mild iron deficiency; her ferritin was within normal limits at 82; and her erythropoietin was within normal limits at 14.9. A multivitamin with iron was added.    Chest pain/epigastric discomfort. Troponin I was negative. EKG revealed paced rhythm. Chest x-ray revealed no acute cardiopulmonary disease. Etiology was likely gastrointestinal in origin. She was restarted on her H2 blocker. Her symptoms resolved.     Procedures: 2-D echocardiogram:Study Conclusions  - Left ventricle: The cavity size  was normal. Wall thickness was increased in a pattern of mild LVH. Systolic function was vigorous. The estimated ejection fraction was in the range of 65% to 70%. Wall motion was normal; there were no regional wall motion abnormalities. There was an increased relative contribution of atrial contraction to ventricular filling. Doppler parameters are consistent with abnormal left ventricular relaxation (grade 1 diastolic dysfunction). - Ventricular septum: Septal motion showed abnormal function and dyssynergy. These changes are consistent with right ventricular pacing. - Aortic valve: Mildly calcified annulus. Trileaflet; mildly thickened leaflets. There was no stenosis. Mild regurgitation. - Mitral valve: Calcified annulus. Mildly thickened leaflets . - Left atrium: The atrium was mildly to moderately dilated. - Right ventricle: Pacer wire or catheter noted in right ventricle. - Right atrium: Pacer wire or catheter noted in right atrium. - Tricuspid valve: Mild regurgitation.     Consultations:  Endocrinologist, Dr. Dorris Fetch  Discharge Exam: Filed Vitals:   03/25/13 1240  BP: 160/75  Pulse: 75  Temp:   Resp:    Supine blood pressure 154/74. Sitting blood pressure 160/75. Standing blood pressure 104/60.  General: Pleasant elderly 80 year old woman, in no acute distress.  Cardiovascular: S1, S2, with no murmurs rubs or gallops.  Respiratory: Clear to auscultation bilaterally.  Abdomen: Positive bowel sounds, soft, nontender, nondistended.  Musculoskeletal: No acute hot red joints.  Psychiatric/neurologic: She has a pleasant affect. Her speech is clear. She is alert and oriented x2. Cranial nerves II through XII are grossly intact. No nystagmus.   Discharge Instructions  Discharge Orders   Future Appointments Provider Department Dept Phone   03/31/2013 1:30 PM Kathyrn Drown, MD [place] [phone]   04/01/2013 1:00 PM [facility] Provider [place] [phone]   04/04/2013 10:00 AM Rogene Houston, MD [place]  [phone]   Future Orders Complete By Expires   Diet general  As directed    Discharge instructions  As directed    Comments:     STAND SLOWLY BEFORE WALKING. AVOID SUDDEN MOVEMENTS OF YOUR HEAD.   Increase activity slowly  As directed        Medication List    STOP taking these medications       megestrol 400 MG/10ML suspension  Commonly known as:  MEGACE      TAKE these medications       acetaminophen 500 MG tablet  Commonly known as:  TYLENOL  Take 500 mg by mouth every 6 (six) hours as needed. Pain     cyanocobalamin 1000 MCG/ML injection  Commonly known as:  (VITAMIN B-12)  Inject 1,000 mcg into the muscle every 30 (thirty) days.     docusate sodium 100 MG capsule  Commonly known as:  COLACE  Take 100 mg by mouth daily.     feeding supplement (ENSURE COMPLETE) Liqd  Take 237 mLs by mouth 3 (three) times daily with meals.     levothyroxine 75 MCG tablet  Commonly known as:  SYNTHROID, LEVOTHROID  Take 1 tablet (75 mcg total) by mouth daily before breakfast.     loperamide 2 MG tablet  Commonly known as:  IMODIUM A-D  Take 1 tablet (2 mg total) by mouth daily.     magnesium hydroxide 400 MG/5ML suspension  Commonly known as:  MILK OF MAGNESIA  Take 30 mLs by mouth daily as needed for mild constipation.     meclizine 12.5 MG tablet  Commonly known as:  ANTIVERT  Take 1 tablet (12.5 mg total) by mouth 3 (three) times daily as needed for dizziness.     midodrine 5 MG tablet  Commonly known as:  PROAMATINE  Take 1 tablet (5 mg total) by mouth 3 (three) times daily.     multivitamin with minerals Tabs tablet  Take 1 tablet by mouth daily.     oxyCODONE 5 MG immediate release tablet  Commonly known as:  Oxy IR/ROXICODONE  Take 5 mg by mouth every 6 (six) hours as needed for severe pain. pain     predniSONE 10 MG tablet  Commonly known as:   DELTASONE  Take 1 tablet (10 mg total) by mouth daily. For treatment of adrenal insufficiency.     ranitidine 150 MG tablet  Commonly known as:  ZANTAC  Take 150 mg by mouth 2 (two) times daily. For acid reflux     senna 8.6 MG Tabs tablet  Commonly known as:  SENOKOT  Take 1 tablet (8.6 mg total) by mouth at bedtime as needed for mild constipation.     Vitamin D 400 UNITS capsule  Take 400 Units by mouth daily.       Allergies  Allergen Reactions  . Penicillins Rash       Follow-up Information   Follow up with NIDA,GEBRESELASSIE, MD On 04/13/2013. (at 2:00 pm)    Specialty:  Endocrinology   Contact information:   [place] [address] [phone]       Follow up with Sallee Lange, MD On 03/31/2013. (at 1:15 pm)    Specialty:  Family Medicine   Contact information:   [address] [place] [address] [phone]        The results of significant diagnostics from this hospitalization (including imaging, microbiology, ancillary and laboratory) are listed below for reference.  Significant Diagnostic Studies: Dg Esophagus  03/08/2013   CLINICAL DATA:  Dysphagia.  EXAM: ESOPHOGRAM/BARIUM SWALLOW  TECHNIQUE: Single contrast examination was performed using  thin barium.  COMPARISON:  02/11/2013  FLUOROSCOPY TIME:  2 min and 18 seconds  FINDINGS: Initial barium swallows demonstrate normal pharyngeal motion with swallowing. No where ischial penetration or aspiration. A tiny Zenker's diverticulum is again demonstrated.  Diffuse esophageal spasm is noted. There are numerous mid esophageal diverticuli. There is also a large hiatal hernia. No GE reflux was demonstrated. The 13 mm barium pill would not pass through the distal esophagus.  IMPRESSION: Tiny Zenker's diverticulum.  Esophageal dysmotility with diffuse esophageal spasm.  Numerous mid esophageal diverticuli.  Large hiatal hernia.  A 13 mm barium pill would not pass through the esophagus.  No  change from recent prior study.   Electronically Signed   By: Loralie Champagne M.D.   On: 03/08/2013 08:20   Dg Chest Port 1 View  03/23/2013   CLINICAL DATA:  Left chest pain  EXAM: PORTABLE CHEST - 1 VIEW  COMPARISON:  03/18/2013  FINDINGS: Left subclavian pacemaker stable. Mild cardiomegaly. Stable left mid lung calcified granuloma and calcified left hilar lymph nodes. Lungs otherwise clear. . No effusion. Visualized skeletal structures are unremarkable. Surgical clips right upper abdomen.  IMPRESSION: No acute cardiopulmonary disease.   Electronically Signed   By: Oley Balm M.D.   On: 03/23/2013 20:04   Dg Chest Portable 1 View  03/18/2013   CLINICAL DATA:  Weakness, dizziness, history pacemaker, GERD  EXAM: PORTABLE CHEST - 1 VIEW  COMPARISON:  02/18/2013  FINDINGS: Left subclavian transvenous pacemaker leads project at right atrium and right ventricle.  Normal heart size, mediastinal contours, and pulmonary vascularity.  Atherosclerotic calcifications aorta.  Calcified granuloma left upper lobe with calcified left hilar adenopathy.  COPD changes with biapical pleuroparenchymal scarring greater on right, stable.  No definite acute infiltrate, pleural effusion or pneumothorax.  Bones appear demineralized.  IMPRESSION: Changes of COPD and old granulomatous disease with stable biapical scarring greater on right.  No acute abnormalities.   Electronically Signed   By: Ulyses Southward M.D.   On: 03/18/2013 16:58    Microbiology: Recent Results (from the past 240 hour(s))  URINE CULTURE     Status: None   Collection Time    03/18/13  6:15 PM      Result Value Range Status   Specimen Description URINE, CATHETERIZED   Final   Special Requests NONE   Final   Culture  Setup Time     Final   Value: 03/19/2013 02:52     Performed at [facility] Count     Final   Value: NO GROWTH     Performed at [facility]   Culture     Final   Value: NO GROWTH     Performed at [facility]   Report Status 03/20/2013 FINAL   Final  URINE CULTURE     Status: None   Collection Time    03/19/13  1:15 AM      Result Value Range Status   Specimen Description URINE, RANDOM   Final   Special Requests NONE   Final   Culture  Setup Time     Final   Value: 03/19/2013 21:31     Performed at [facility] Count     Final   Value: 15,000 COLONIES/ML     Performed at  [facility]     Final   Value: Multiple bacterial morphotypes present, none predominant. Suggest appropriate recollection if clinically indicated.     Performed at [facility]   Report Status 03/20/2013 FINAL   Final     Labs: Basic Metabolic Panel:  Recent Labs Lab 03/19/13 0519 03/22/13 0749 03/23/13 0635 03/24/13 0608 03/25/13 0609  NA 143 142 143 141 140  K 4.8 3.4* 4.4 4.3 4.3  CL 110 109 110 107 105  CO2 21 17* 21 22 24   GLUCOSE 79 76 92 96 87  BUN 29* 8 11 16 21   CREATININE 1.02 0.77 0.89 0.92 0.91  CALCIUM 8.9 8.5 9.1 9.1 9.3   Liver Function Tests:  Recent Labs Lab 03/18/13 1649  AST 19  ALT 9  ALKPHOS 90  BILITOT 0.3  PROT 8.3  ALBUMIN 3.5   No results found for this basename: LIPASE, AMYLASE,  in the last 168 hours No results found for this basename: AMMONIA,  in the last 168 hours CBC:  Recent Labs Lab 03/18/13 1649 03/19/13 0519 03/22/13 0749 03/24/13 0608  WBC 13.0* 10.3 8.4 8.3  NEUTROABS 8.0*  --   --   --   HGB 10.8* 9.4* 9.5* 9.9*  HCT 33.9* 29.6* 28.6* 30.3*  MCV 93.6 95.5 92.0 93.8  PLT 344 285 239 294   Cardiac Enzymes:  Recent Labs Lab 03/18/13 1649 03/23/13 2025  TROPONINI <0.30 <0.30   BNP: BNP (last 3 results) No results found for this basename: PROBNP,  in the last 8760 hours CBG: No results found for this basename: GLUCAP,  in the last 168 hours     Signed:  [facility] 03/25/2013, 1:38 PM

## 2013-03-31 ENCOUNTER — Ambulatory Visit (INDEPENDENT_AMBULATORY_CARE_PROVIDER_SITE_OTHER): Payer: Medicare Other | Admitting: Family Medicine

## 2013-03-31 ENCOUNTER — Encounter: Payer: Self-pay | Admitting: Family Medicine

## 2013-03-31 VITALS — BP 108/60 | Ht 63.0 in | Wt 90.0 lb

## 2013-03-31 DIAGNOSIS — R5381 Other malaise: Secondary | ICD-10-CM

## 2013-03-31 DIAGNOSIS — R531 Weakness: Secondary | ICD-10-CM

## 2013-03-31 DIAGNOSIS — I951 Orthostatic hypotension: Secondary | ICD-10-CM

## 2013-03-31 DIAGNOSIS — F329 Major depressive disorder, single episode, unspecified: Secondary | ICD-10-CM

## 2013-03-31 DIAGNOSIS — D510 Vitamin B12 deficiency anemia due to intrinsic factor deficiency: Secondary | ICD-10-CM

## 2013-03-31 DIAGNOSIS — E43 Unspecified severe protein-calorie malnutrition: Secondary | ICD-10-CM

## 2013-03-31 DIAGNOSIS — N179 Acute kidney failure, unspecified: Secondary | ICD-10-CM

## 2013-03-31 DIAGNOSIS — F3289 Other specified depressive episodes: Secondary | ICD-10-CM

## 2013-03-31 DIAGNOSIS — I2510 Atherosclerotic heart disease of native coronary artery without angina pectoris: Secondary | ICD-10-CM

## 2013-03-31 DIAGNOSIS — R5383 Other fatigue: Secondary | ICD-10-CM

## 2013-03-31 DIAGNOSIS — E2749 Other adrenocortical insufficiency: Secondary | ICD-10-CM

## 2013-03-31 DIAGNOSIS — E039 Hypothyroidism, unspecified: Secondary | ICD-10-CM

## 2013-03-31 DIAGNOSIS — E2740 Unspecified adrenocortical insufficiency: Secondary | ICD-10-CM

## 2013-03-31 MED ORDER — HYDROCODONE-ACETAMINOPHEN 5-325 MG PO TABS: 1.0000 | ORAL_TABLET | Freq: Four times a day (QID) | ORAL | Status: DC | PRN

## 2013-03-31 NOTE — Progress Notes (Signed)
Subjective:    Patient ID: Kaitlyn Cooper, female    DOB: Nov 12, 1926, 80 y.o.   MRN: [id number]  HPI Patient is here today for a f/u visit from the ED on 01/30. She was released 7 days later. All of her hospital notes were reviewed including the prior hospitalization before she was sent to assisted living/nursing home. Then her readmission just recently was also reviewed. This was reviewed with family as well. She went to the ER due to not eating well. Patient relates she has a very poor appetite. She does not find anything interesting. She eats very little 3 times a day she drinks some. She basically says that she doesn't really see the point in trying to get better. She states she is ready to die. She denies being suicidal. She denies being depressed but she does relate that she misses her husband. It should be noted that the patient was called within 2 days of discharge to talk with family to advise them to bring all of her medicines and to be seen for evaluation. This patient has multitude of health problems including adrenal insufficiency, malnutrition, weight loss, bone marrow dysplasia, anemia, pernicious anemia, probable side effects from medications including drowsiness from necklace in as well as oxycodone.  Almost 60 minutes was spent with this patient discussing all these different I. areas.  They told her she needed a "face to face" for Medicare. This patient needs home health care because she is at high risk of being readmitted. She is homebound. She has the above problems. She has severe weakness. She needs monitoring of her medications. Monitoring of nutritional intake. And also monitoring for any occurrences of bed sores for which she is at high risk. She also needs monitoring of her vital signs with the adrenal insufficiency as well as orthostatic hypotension. This patient would benefit from nursing evaluation. This patient would also benefit from physical therapy. Eventually this  patient will need hospice care.  This patient stated that she does not want any heroic measures. Does not want CPR or ventilation. She also states that she is okay with going ahead and being treated with medications and if necessary readmission into the hospital. Family members state that they feel that she is given up that tends to bother them    Review of Systems    this patient complains of headache weakness dizziness nausea lightheadedness with standing denies chest pain denies shortness of breath denies abdominal pain vomiting diarrhea she does relate slight dysphagia she denies numbness in the legs but relates some weakness in the legs she had EGD done while in the hospital along with stretching. Objective:   Physical Exam Patient looks very weak neck no masses lungs are clear heart regular pulse normal abdomen soft extremities no edema patient cachectic down 25 pounds from where she was year       Assessment & Plan:  This patient is asked is at extreme risk of dying due to all these problems in my opinion she has a multitude health problems which although could be treated in helped maintain I think in the long run her prognosis is poor I feel that this patient will probably need to be hospice oriented somewhere in the near future I will see her back in a few weeks we will discuss this more. We have went ahead and recommended to stay away from oxycodone. She will continue Synthroid. She will continue the prednisone for adrenal insufficiency. She will do the B12 shots  on a monthly basis through home health. We will go ahead with a home health referral and physical therapy referral all the above aspects regarding a face-to-face, why she needs this, what she needs is all detailed above. Once again approximately 60 minutes taken for this hospital transition visit.

## 2013-04-01 ENCOUNTER — Ambulatory Visit (HOSPITAL_COMMUNITY): Payer: Medicare Other

## 2013-04-01 ENCOUNTER — Telehealth: Payer: Self-pay | Admitting: Family Medicine

## 2013-04-01 ENCOUNTER — Other Ambulatory Visit: Payer: Self-pay | Admitting: Family Medicine

## 2013-04-01 MED ORDER — SERTRALINE HCL 25 MG PO TABS: 25.0000 mg | ORAL_TABLET | Freq: Every day | ORAL | Status: DC

## 2013-04-01 NOTE — Telephone Encounter (Signed)
Patients sister Tessie Fass called and asked about the anti-depressant that she thought was going to be called in for patient. Please advise. Pharmacy did not have anything.  [employer]

## 2013-04-04 ENCOUNTER — Ambulatory Visit (INDEPENDENT_AMBULATORY_CARE_PROVIDER_SITE_OTHER): Payer: Medicare Other | Admitting: Internal Medicine

## 2013-04-04 NOTE — Telephone Encounter (Signed)
seteraline was sent in Friday afternoon, that is for depression. Physical therapy can be helpful- I would recommend it to build her strength -but reality is she may get to the point where she can't do it, so in a sense it is optional and they can refuse it

## 2013-04-04 NOTE — Telephone Encounter (Signed)
Left message to return call 

## 2013-04-04 NOTE — Telephone Encounter (Signed)
Patients sister called back to check on this message.   Also, asked if due to patients condition with her weakness, does she need to continue physical therapy?

## 2013-04-04 NOTE — Telephone Encounter (Signed)
ok 

## 2013-04-04 NOTE — Telephone Encounter (Signed)
Sister states that Physical Therapy just frustrates her and they will have Home Health to send an order to cancel it.

## 2013-04-13 ENCOUNTER — Telehealth: Payer: Self-pay | Admitting: *Deleted

## 2013-04-13 NOTE — Telephone Encounter (Signed)
[facility]ed [facility] to let them know Dr. Nicki Reaper wants a met 7 and cbc. Reason anemia, renal insuff, and hypokalemia. And also to do monthly B 12 injection. Their next visit is March 3rd.

## 2013-04-19 ENCOUNTER — Ambulatory Visit: Payer: Medicare Other | Admitting: Family Medicine

## 2013-04-19 ENCOUNTER — Other Ambulatory Visit: Payer: Self-pay | Admitting: *Deleted

## 2013-04-19 ENCOUNTER — Other Ambulatory Visit (HOSPITAL_BASED_OUTPATIENT_CLINIC_OR_DEPARTMENT_OTHER): Payer: Self-pay | Admitting: Internal Medicine

## 2013-04-19 MED ORDER — CYANOCOBALAMIN 1000 MCG/ML IJ SOLN: 1000.0000 ug | INTRAMUSCULAR | Status: AC

## 2013-04-21 ENCOUNTER — Telehealth: Payer: Self-pay | Admitting: Family Medicine

## 2013-04-21 ENCOUNTER — Other Ambulatory Visit (HOSPITAL_BASED_OUTPATIENT_CLINIC_OR_DEPARTMENT_OTHER): Payer: Self-pay | Admitting: Internal Medicine

## 2013-04-21 MED ORDER — LEVOTHYROXINE SODIUM 75 MCG PO TABS: 75.0000 ug | ORAL_TABLET | Freq: Every day | ORAL | Status: DC

## 2013-04-21 NOTE — Telephone Encounter (Signed)
Patient is out of her thyroid medicine and needs a refill sent to [facility]

## 2013-04-21 NOTE — Telephone Encounter (Signed)
Rx sent electronically to pharmacy. Patient notified. 

## 2013-04-26 ENCOUNTER — Encounter: Payer: Self-pay | Admitting: Family Medicine

## 2013-04-26 ENCOUNTER — Ambulatory Visit (INDEPENDENT_AMBULATORY_CARE_PROVIDER_SITE_OTHER): Payer: Medicare Other | Admitting: Family Medicine

## 2013-04-26 VITALS — BP 110/70 | Ht 63.0 in | Wt 89.0 lb

## 2013-04-26 DIAGNOSIS — R5381 Other malaise: Secondary | ICD-10-CM

## 2013-04-26 DIAGNOSIS — T148XXA Other injury of unspecified body region, initial encounter: Secondary | ICD-10-CM

## 2013-04-26 DIAGNOSIS — I2510 Atherosclerotic heart disease of native coronary artery without angina pectoris: Secondary | ICD-10-CM

## 2013-04-26 DIAGNOSIS — R5383 Other fatigue: Secondary | ICD-10-CM

## 2013-04-26 MED ORDER — OMEPRAZOLE 20 MG PO CPDR: 20.0000 mg | DELAYED_RELEASE_CAPSULE | Freq: Every day | ORAL | Status: DC

## 2013-04-26 NOTE — Progress Notes (Signed)
   Subjective:    Patient ID: Kaitlyn Cooper, female    DOB: November 21, 1926, 80 y.o.   MRN: [id number]  HPI Patient fell 2 days ago in the kitchen, and now is experiencing some back pain. She feels sore.  Her legs felt weak, which is why she fell.  She denies any specific troubles other than some back pain and soreness. She denies shortness of breath nausea vomiting she does state she feels weak and rundown although she looks better than the last visit she had.  PMH benign Review of Systems Denies shortness of breath vomiting diarrhea sweats or chills    Objective:   Physical Exam Her lungs are clear heart regular pulse normal extremities no edema skin warm dry       Assessment & Plan:  #1 she needs followup with endocrinologist, Dr Dorris Fetch, family will make sure that she gets in with them. #2 recent fall-contusion should gradually get better #3 malnourishment she is doing a better job of eating her weight is stable we will follow her up again in 4 weeks' time

## 2013-04-28 ENCOUNTER — Telehealth: Payer: Self-pay | Admitting: Family Medicine

## 2013-04-28 MED ORDER — HYDROCODONE-ACETAMINOPHEN 5-325 MG PO TABS: 1.0000 | ORAL_TABLET | Freq: Four times a day (QID) | ORAL | Status: DC | PRN

## 2013-04-28 NOTE — Telephone Encounter (Signed)
Use sparingly, may refill

## 2013-04-28 NOTE — Telephone Encounter (Signed)
HYDROcodone-acetaminophen (NORCO/VICODIN) 5-325 MG per tablet   Refill please,   Last seen 04/26/13, last med check 03/31/13  Last script 03/31/13

## 2013-04-28 NOTE — Telephone Encounter (Signed)
Notified Patsy (daughter) that script is ready for pickup.

## 2013-05-02 ENCOUNTER — Emergency Department (HOSPITAL_COMMUNITY)
Admission: EM | Admit: 2013-05-02 | Discharge: 2013-05-02 | Disposition: A | Discharge status: Home / Self Care | Payer: Medicare Other | Attending: Emergency Medicine | Admitting: Emergency Medicine

## 2013-05-02 ENCOUNTER — Emergency Department (HOSPITAL_COMMUNITY): Payer: Medicare Other

## 2013-05-02 ENCOUNTER — Encounter (HOSPITAL_COMMUNITY): Payer: Self-pay | Admitting: Emergency Medicine

## 2013-05-02 DIAGNOSIS — E039 Hypothyroidism, unspecified: Secondary | ICD-10-CM | POA: Insufficient documentation

## 2013-05-02 DIAGNOSIS — K219 Gastro-esophageal reflux disease without esophagitis: Secondary | ICD-10-CM | POA: Insufficient documentation

## 2013-05-02 DIAGNOSIS — G8911 Acute pain due to trauma: Secondary | ICD-10-CM | POA: Insufficient documentation

## 2013-05-02 DIAGNOSIS — Z9889 Other specified postprocedural states: Secondary | ICD-10-CM | POA: Insufficient documentation

## 2013-05-02 DIAGNOSIS — Z862 Personal history of diseases of the blood and blood-forming organs and certain disorders involving the immune mechanism: Secondary | ICD-10-CM | POA: Insufficient documentation

## 2013-05-02 DIAGNOSIS — I951 Orthostatic hypotension: Secondary | ICD-10-CM | POA: Insufficient documentation

## 2013-05-02 DIAGNOSIS — Z79899 Other long term (current) drug therapy: Secondary | ICD-10-CM | POA: Insufficient documentation

## 2013-05-02 DIAGNOSIS — E2749 Other adrenocortical insufficiency: Secondary | ICD-10-CM | POA: Insufficient documentation

## 2013-05-02 DIAGNOSIS — IMO0002 Reserved for concepts with insufficient information to code with codable children: Secondary | ICD-10-CM | POA: Insufficient documentation

## 2013-05-02 DIAGNOSIS — F3289 Other specified depressive episodes: Secondary | ICD-10-CM | POA: Insufficient documentation

## 2013-05-02 DIAGNOSIS — Z950 Presence of cardiac pacemaker: Secondary | ICD-10-CM | POA: Insufficient documentation

## 2013-05-02 DIAGNOSIS — F329 Major depressive disorder, single episode, unspecified: Secondary | ICD-10-CM | POA: Insufficient documentation

## 2013-05-02 DIAGNOSIS — Z87891 Personal history of nicotine dependence: Secondary | ICD-10-CM | POA: Insufficient documentation

## 2013-05-02 DIAGNOSIS — N390 Urinary tract infection, site not specified: Secondary | ICD-10-CM | POA: Insufficient documentation

## 2013-05-02 DIAGNOSIS — M549 Dorsalgia, unspecified: Secondary | ICD-10-CM

## 2013-05-02 DIAGNOSIS — Z880 Allergy status to penicillin: Secondary | ICD-10-CM | POA: Insufficient documentation

## 2013-05-02 LAB — URINALYSIS, ROUTINE W REFLEX MICROSCOPIC
Bilirubin Urine: NEGATIVE
Glucose, UA: NEGATIVE mg/dL
Hgb urine dipstick: NEGATIVE
Ketones, ur: NEGATIVE mg/dL
Nitrite: NEGATIVE
PROTEIN: NEGATIVE mg/dL
Specific Gravity, Urine: 1.025 (ref 1.005–1.030)
UROBILINOGEN UA: 0.2 mg/dL (ref 0.0–1.0)
pH: 5 (ref 5.0–8.0)

## 2013-05-02 LAB — BASIC METABOLIC PANEL
BUN: 32 mg/dL — ABNORMAL HIGH (ref 6–23)
CALCIUM: 9.7 mg/dL (ref 8.4–10.5)
CO2: 26 mEq/L (ref 19–32)
CREATININE: 1 mg/dL (ref 0.50–1.10)
Chloride: 103 mEq/L (ref 96–112)
GFR calc Af Amer: 58 mL/min — ABNORMAL LOW (ref >90)
GFR, EST NON AFRICAN AMERICAN: 50 mL/min — AB (ref >90)
Glucose, Bld: 93 mg/dL (ref 70–99)
Potassium: 3.7 mEq/L (ref 3.7–5.3)
Sodium: 142 mEq/L (ref 137–147)

## 2013-05-02 LAB — CBC WITH DIFFERENTIAL/PLATELET
BASOS ABS: 0 10*3/uL (ref 0.0–0.1)
Basophils Relative: 0 % (ref 0–1)
Eosinophils Absolute: 0.1 10*3/uL (ref 0.0–0.7)
Eosinophils Relative: 1 % (ref 0–5)
HCT: 34.7 % — ABNORMAL LOW (ref 36.0–46.0)
Hemoglobin: 11.3 g/dL — ABNORMAL LOW (ref 12.0–15.0)
LYMPHS ABS: 3 10*3/uL (ref 0.7–4.0)
LYMPHS PCT: 30 % (ref 12–46)
MCH: 31.2 pg (ref 26.0–34.0)
MCHC: 32.6 g/dL (ref 30.0–36.0)
MCV: 95.9 fL (ref 78.0–100.0)
Monocytes Absolute: 1.1 10*3/uL — ABNORMAL HIGH (ref 0.1–1.0)
Monocytes Relative: 11 % (ref 3–12)
NEUTROS PCT: 58 % (ref 43–77)
Neutro Abs: 5.8 10*3/uL (ref 1.7–7.7)
PLATELETS: 270 10*3/uL (ref 150–400)
RBC: 3.62 MIL/uL — ABNORMAL LOW (ref 3.87–5.11)
RDW: 15.7 % — AB (ref 11.5–15.5)
WBC: 10 10*3/uL (ref 4.0–10.5)

## 2013-05-02 LAB — URINE MICROSCOPIC-ADD ON

## 2013-05-02 MED ORDER — OXYCODONE-ACETAMINOPHEN 5-325 MG PO TABS: 1.0000 | ORAL_TABLET | ORAL | Status: DC | PRN

## 2013-05-02 MED ORDER — OXYCODONE-ACETAMINOPHEN 5-325 MG PO TABS
1.0000 | ORAL_TABLET | Freq: Once | ORAL | Status: AC
  Administered 2013-05-02: 1 via ORAL
  Filled 2013-05-02: qty 1

## 2013-05-02 MED ORDER — CIPROFLOXACIN HCL 500 MG PO TABS: 500.0000 mg | ORAL_TABLET | Freq: Every day | ORAL | Status: DC

## 2013-05-02 NOTE — ED Notes (Signed)
In and out cath not completed. Pt was able to preform clean catch urine. Physician was asked and said it was fine to proceed with clean catch.

## 2013-05-02 NOTE — ED Provider Notes (Signed)
CSN: [id number]     Arrival date & time 05/02/13  0907 History   This chart was scribed for Sharyon Cable, MD by Era Bumpers, ED scribe. This patient was seen in room APA10/APA10 and the patient's care was started at 0907.  Chief Complaint  Patient presents with  . Back Pain   The history is provided by the patient. No language interpreter was used.   HPI Comments: Kaitlyn Cooper is a 80 y.o. female who presents to the Emergency Department for recurrent back pain for x1 week after she fell backwards x1 week ago onto the back of her head after her legs gave out from her. She also has a T9 compression fx from a fall months ago as well but since this last fall x1 week ago she has been having recurrent back pain. She reports associated HA. No previous back surgeries. She states her leg weakness is generalized and not changed since her most recent fall. She denies CP or abdominal pain. She reports some urinary frequency last PM. She denies any constipation or diarrhea.  Past Medical History  Diagnosis Date  . Anemia     followed by Dr.Neijstrom  . Hyperlipidemia   . Depression   . Hypothyroidism   . History of atherosclerotic cardiovascular disease   . History of colonoscopy     02/2002 showed few diverticula at sigmoid colon otherwise normal colonoscopy and terminal ileoscopy 1995 several colon polyps ,tubular adenoma  . GERD (gastroesophageal reflux disease)     chronic  . GERD (gastroesophageal reflux disease)     chronic gerd  . Tiredness   . T9 vertebral fracture 02/09/13    result of fall at home  . Adrenal insufficiency 03/23/2013  . Orthostatic hypotension 03/21/2013  . Pacemaker    Past Surgical History  Procedure Laterality Date  . Insert / replace / remove pacemaker  01/17/2009    [employer] Cardiac Pacemaker   . Cardiac catheterization  1998  . Cholecystectomy    . 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 Esophagogastroduodenoscopy (egd) with  esophageal dilation  03/22/2012    Procedure: ESOPHAGOGASTRODUODENOSCOPY (EGD) WITH ESOPHAGEAL DILATION;  Surgeon: Rogene Houston, MD;  Location: [facility];  Service: Endoscopy;  Laterality: N/A;  730  . Colonoscopy N/A 12/29/2012    Procedure: COLONOSCOPY;  Surgeon: Rogene Houston, MD;  Location: [facility];  Service: Endoscopy;  Laterality: N/A;  130  . Esophagogastroduodenoscopy (egd) with esophageal dilation N/A 02/12/2013    Procedure: ESOPHAGOGASTRODUODENOSCOPY (EGD) WITH ESOPHAGEAL DILATION;  Surgeon: Rogene Houston, MD;  Location: [facility];  Service: Endoscopy;  Laterality: N/A;   No family history on file. History  Substance Use Topics  . Smoking status: Former Smoker -- 1.00 packs/day for 40 years    Quit date: 08/25/1971  . Smokeless tobacco: Never Used  . Alcohol Use: No   OB History   Grav Para Term Preterm Abortions TAB SAB Ect Mult Living                 Review of Systems  Constitutional: Negative for fever and chills.  Respiratory: Negative for cough and shortness of breath.   Cardiovascular: Negative for chest pain.  Gastrointestinal: Negative for abdominal pain.  Musculoskeletal: Positive for back pain.  All other systems reviewed and are negative.    Allergies  Penicillins  Home Medications   Current Outpatient Rx  Name  Route  Sig  Dispense  Refill  . acetaminophen (TYLENOL) 500 MG tablet   Oral   Take 500 mg by mouth every 6 (six) hours as needed. Pain         . Cholecalciferol (VITAMIN D) 400 UNITS capsule   Oral   Take 400 Units by mouth daily.           . cyanocobalamin (,VITAMIN B-12,) 1000 MCG/ML injection   Intramuscular   Inject 1 mL (1,000 mcg total) into the muscle every 30 (thirty) days.   1 mL   5   . docusate sodium (COLACE) 100 MG capsule   Oral   Take 100 mg by mouth daily.         . feeding supplement, ENSURE COMPLETE, (ENSURE COMPLETE) LIQD   Oral   Take 237 mLs by mouth 3 (three) times daily with  meals.         Marland Kitchen HYDROcodone-acetaminophen (NORCO/VICODIN) 5-325 MG per tablet   Oral   Take 1 tablet by mouth every 6 (six) hours as needed for moderate pain.   60 tablet   0   . levothyroxine (SYNTHROID, LEVOTHROID) 75 MCG tablet   Oral   Take 1 tablet (75 mcg total) by mouth daily before breakfast.   30 tablet   5   . loperamide (IMODIUM A-D) 2 MG tablet   Oral   Take 1 tablet (2 mg total) by mouth daily.   1 tablet   0   . magnesium hydroxide (MILK OF MAGNESIA) 400 MG/5ML suspension   Oral   Take 30 mLs by mouth daily as needed for mild constipation.         . meclizine (ANTIVERT) 12.5 MG tablet   Oral   Take 1 tablet (12.5 mg total) by mouth 3 (three) times daily as needed for dizziness.   30 tablet   0   . midodrine (PROAMATINE) 5 MG tablet   Oral   Take 1 tablet (5 mg total) by mouth 3 (three) times daily.   90 tablet   3   . Multiple Vitamin (MULTIVITAMIN WITH MINERALS) TABS tablet   Oral   Take 1 tablet by mouth daily.         Marland Kitchen omeprazole (PRILOSEC) 20 MG capsule   Oral   Take 1 capsule (20 mg total) by mouth daily.   30 capsule   12   . predniSONE (DELTASONE) 10 MG tablet   Oral   Take 1 tablet (10 mg total) by mouth daily. For treatment of adrenal insufficiency.   30 tablet   3   . senna (SENOKOT) 8.6 MG TABS tablet   Oral   Take 1 tablet (8.6 mg total) by mouth at bedtime as needed for mild constipation.         . sertraline (ZOLOFT) 25 MG tablet   Oral   Take 1 tablet (25 mg total) by mouth daily.   30 tablet   6     Please notify family Rx ready(may want delivered)    Triage Vitals: BP 153/60  Pulse 78  Temp(Src) 97.6 F (36.4 C)  Resp 18  Ht 5\' 3"  (1.6 m)  Wt 90 lb (40.824 kg)  BMI 15.95 kg/m2  SpO2 98%  Physical Exam CONSTITUTIONAL: Well developed/well nourished HEAD: Normocephalic/atraumatic, diffuse tenderness to palpation EYES: EOMI/PERRL ENMT: Mucous membranes moist NECK: supple no meningeal signs SPINE:  entire spine tender. Spine is kyphotic, No bruising/crepitance/stepoffs noted to spine CV: S1/S2 noted, no murmurs/rubs/gallops noted LUNGS: Lungs  are clear to auscultation bilaterally, no apparent distress ABDOMEN: soft, nontender, no rebound or guarding GU:no cva tenderness NEURO: Pt is awake/alert, moves all extremitiesx4 EXTREMITIES: pulses normal, full ROM All other extremities/joints palpated/ranged and nontender SKIN: warm, color normal PSYCH: no abnormalities of mood noted ED Course  Procedures  DIAGNOSTIC STUDIES: Oxygen Saturation is 98% on room air , normal by my interpretation.    COORDINATION OF CARE: At 945 AM Discussed treatment plan with patient which includes pain medicine, head, C-spine CT, lumbar spine X-ray, UA, apply C-collar. Patient agrees.   Pt improved She is ambulatory, no distress ?uti, will start antibiotics No acute injury noted by imaging She requested f/u on imaging possible kyphoplasty due to persistent pain in her back, advised to f/u with PCP for this, also given info for spine followup She requested percocet rather than the pain meds at home   Labs Review Labs Reviewed  URINALYSIS, ROUTINE W REFLEX MICROSCOPIC - Abnormal; Notable for the following:    Leukocytes, UA TRACE (*)    All other components within normal limits  BASIC METABOLIC PANEL - Abnormal; Notable for the following:    BUN 32 (*)    GFR calc non Af Amer 50 (*)    GFR calc Af Amer 58 (*)    All other components within normal limits  CBC WITH DIFFERENTIAL - Abnormal; Notable for the following:    RBC 3.62 (*)    Hemoglobin 11.3 (*)    HCT 34.7 (*)    RDW 15.7 (*)    Monocytes Absolute 1.1 (*)    All other components within normal limits  URINE MICROSCOPIC-ADD ON - Abnormal; Notable for the following:    Bacteria, UA MANY (*)    All other components within normal limits  URINE CULTURE   Imaging Review Dg Thoracic Spine W/swimmers  05/02/2013   CLINICAL DATA:  Prior  history of T9 compression fracture. Back pain.  EXAM: THORACIC SPINE - 2 VIEW + SWIMMERS  COMPARISON:  02/18/2013.  FINDINGS: Three views of the thoracic spine again demonstrate a lower thoracic vertebral body compression fracture (T9) with approximately 80% loss of anterior vertebral body height and 50% loss of posterior vertebral body height, with associated sclerosis, similar to the prior examination from 02/18/2013. No new displaced fracture or additional compression type fracture is identified at this time. Multilevel degenerative disc disease. Visualized portions of the thorax demonstrate the presence of a dual lead pacemaker device and extensive atherosclerosis throughout the thoracic aorta. Surgical clips project over the right upper quadrant of the abdomen, compatible with prior cholecystectomy.  IMPRESSION: 1. Old T9 compression fracture redemonstrated, as above. No new acute findings are noted.   Electronically Signed   By: Trudie Reed M.D.   On: 05/02/2013 10:51   Dg Lumbar Spine Complete  05/02/2013   CLINICAL DATA:  T9 compression fracture.  Back pain.  EXAM: LUMBAR SPINE - COMPLETE 4+ VIEW  COMPARISON:  Lumbar spine radiograph 02/14/2013.  FINDINGS: Five views of the lumbar spine demonstrate no definite acute displaced fracture or compression type fracture. No definite defects of the pars interarticularis. Multilevel degenerative disc disease, most severe at L1-L2. Multilevel facet arthropathy, most severe at L4-L5 and L5-S1. Mild levoscoliosis is centered at the level of L2, unchanged. Extensive atherosclerosis.  IMPRESSION: 1. No acute radiographic abnormality of the lumbar spine. 2. Severe multilevel degenerative disc disease and lumbar spondylosis, as above.   Electronically Signed   By: Trudie Reed M.D.   On: 05/02/2013  10:53   Ct Head Wo Contrast  05/02/2013   CLINICAL DATA:  Frequent falls.  Head and neck pain.  EXAM: CT HEAD WITHOUT CONTRAST  CT CERVICAL SPINE WITHOUT CONTRAST   TECHNIQUE: Multidetector CT imaging of the head and cervical spine was performed following the standard protocol without intravenous contrast. Multiplanar CT image reconstructions of the cervical spine were also generated.  COMPARISON:  Cervical spine radiographs 03/02/2012  FINDINGS: CT HEAD FINDINGS  There is no definite evidence of acute cortical infarct. No intracranial hemorrhage is identified. There is mild to moderate cerebral atrophy. Periventricular white-matter hypodensities are nonspecific but compatible with moderate chronic small vessel ischemic disease. Small hypodensity in the right thalamus may represent age-indeterminate lacunar infarct. Prior bilateral cataract surgeries noted. Trace left mastoid fluid is noted. The visualized paranasal sinuses are clear.  CT CERVICAL SPINE FINDINGS  Vertebral alignment is normal. No acute cervical spine fracture is identified. Prevertebral soft tissues are within normal limits. Mild endplate spurring is present from C4-5 to C7-T1. Moderate multilevel facet arthrosis is present bilaterally, left greater than right. There is moderate left neural foraminal narrowing at C5-6. There is no evidence of spinal canal stenosis. Scarring and bronchiectasis are noted in the right greater than left lung apices. Moderate bilateral carotid bifurcation calcification is present.  IMPRESSION: 1. No acute intracranial hemorrhage or definite evidence of acute large territory infarct. Possible lacunar infarct in the right thalamus of indeterminate age. 2. No acute osseous abnormality identified in the cervical spine. Moderate multilevel facet arthrosis and mild degenerative disc disease.   Electronically Signed   By: Logan Bores   On: 05/02/2013 10:52   Ct Cervical Spine Wo Contrast  05/02/2013   CLINICAL DATA:  Frequent falls.  Head and neck pain.  EXAM: CT HEAD WITHOUT CONTRAST  CT CERVICAL SPINE WITHOUT CONTRAST  TECHNIQUE: Multidetector CT imaging of the head and cervical  spine was performed following the standard protocol without intravenous contrast. Multiplanar CT image reconstructions of the cervical spine were also generated.  COMPARISON:  Cervical spine radiographs 03/02/2012  FINDINGS: CT HEAD FINDINGS  There is no definite evidence of acute cortical infarct. No intracranial hemorrhage is identified. There is mild to moderate cerebral atrophy. Periventricular white-matter hypodensities are nonspecific but compatible with moderate chronic small vessel ischemic disease. Small hypodensity in the right thalamus may represent age-indeterminate lacunar infarct. Prior bilateral cataract surgeries noted. Trace left mastoid fluid is noted. The visualized paranasal sinuses are clear.  CT CERVICAL SPINE FINDINGS  Vertebral alignment is normal. No acute cervical spine fracture is identified. Prevertebral soft tissues are within normal limits. Mild endplate spurring is present from C4-5 to C7-T1. Moderate multilevel facet arthrosis is present bilaterally, left greater than right. There is moderate left neural foraminal narrowing at C5-6. There is no evidence of spinal canal stenosis. Scarring and bronchiectasis are noted in the right greater than left lung apices. Moderate bilateral carotid bifurcation calcification is present.  IMPRESSION: 1. No acute intracranial hemorrhage or definite evidence of acute large territory infarct. Possible lacunar infarct in the right thalamus of indeterminate age. 2. No acute osseous abnormality identified in the cervical spine. Moderate multilevel facet arthrosis and mild degenerative disc disease.   Electronically Signed   By: Logan Bores   On: 05/02/2013 10:52      MDM   Final diagnoses:  Back pain  UTI (lower urinary tract infection)    Nursing notes including past medical history and social history reviewed and considered in documentation xrays reviewed  and considered Labs/vital reviewed and considered  I personally performed the  services described in this documentation, which was scribed in my presence. The recorded information has been reviewed and is accurate.      Sharyon Cable, MD 05/02/13 [phone]

## 2013-05-02 NOTE — ED Notes (Addendum)
Pt states she was dx with T9 compression fracture on 12/24. Pt states she feel again x 1 week ago and has had increased pain since. Pt also c/o polyuria.

## 2013-05-03 LAB — URINE CULTURE

## 2013-05-10 ENCOUNTER — Telehealth: Payer: Self-pay | Admitting: Family Medicine

## 2013-05-10 MED ORDER — OXYCODONE-ACETAMINOPHEN 5-325 MG PO TABS: 1.0000 | ORAL_TABLET | ORAL | Status: DC | PRN

## 2013-05-10 NOTE — Addendum Note (Signed)
Addended by: Carmelina Noun on: 05/10/2013 03:14 PM   Modules accepted: Orders

## 2013-05-10 NOTE — Telephone Encounter (Signed)
Patient needs prescription for oxycodone 5/325mg 

## 2013-05-10 NOTE — Telephone Encounter (Signed)
Pt notifed script ready for pickup.  

## 2013-05-10 NOTE — Telephone Encounter (Signed)
May have a prescription for oxycodone 5 mg/325, #30, followup in April

## 2013-05-10 NOTE — Telephone Encounter (Signed)
Last filled oxycodone on 05/02/13 #15. Prescribed by Dr. Christy Gentles at hospital.

## 2013-05-10 NOTE — Telephone Encounter (Signed)
Has app in April already scheduled.

## 2013-05-24 ENCOUNTER — Ambulatory Visit: Payer: Medicare Other | Admitting: Family Medicine

## 2013-05-24 ENCOUNTER — Telehealth: Payer: Self-pay | Admitting: Family Medicine

## 2013-05-24 MED ORDER — OXYCODONE-ACETAMINOPHEN 5-325 MG PO TABS: 1.0000 | ORAL_TABLET | Freq: Four times a day (QID) | ORAL | Status: DC | PRN

## 2013-05-24 NOTE — Telephone Encounter (Signed)
Kaitlyn Cooper was notified script ready for pickup and schedule ov within next 4 weeks.

## 2013-05-24 NOTE — Telephone Encounter (Signed)
Patient needs refill on oxycodone 5/325 mg

## 2013-05-24 NOTE — Telephone Encounter (Signed)
Last office visit 03/31/13.

## 2013-05-24 NOTE — Telephone Encounter (Signed)
Stark Klein (sister) states that they want a quantity greater that 30 for the oxycodone since she can take it every 4 hours. Patient was not aware of today's appointment and cancelled.

## 2013-05-24 NOTE — Telephone Encounter (Signed)
May have refill of medication. Family should make sure that this patient follows up in May.

## 2013-05-24 NOTE — Telephone Encounter (Signed)
I would not recommend more than 4 tablets per day. May have a prescription for 120 tablets one 4 times a day when necessary plus she does need a followup office visit within the next 4 weeks and this is necessary before prescribing more pain medication.

## 2013-05-24 NOTE — Addendum Note (Signed)
Addended by: Jesusita Oka on: 05/24/2013 01:23 PM   Modules accepted: Orders

## 2013-05-25 ENCOUNTER — Other Ambulatory Visit: Payer: Self-pay | Admitting: Neurosurgery

## 2013-06-01 ENCOUNTER — Encounter: Payer: Self-pay | Admitting: Family Medicine

## 2013-06-06 ENCOUNTER — Encounter (HOSPITAL_COMMUNITY): Payer: Self-pay

## 2013-06-06 ENCOUNTER — Encounter (HOSPITAL_COMMUNITY)
Admission: RE | Admit: 2013-06-06 | Discharge: 2013-06-06 | Disposition: A | Discharge status: Home / Self Care | Payer: Medicare Other | Source: Ambulatory Visit | Attending: Neurosurgery | Admitting: Neurosurgery

## 2013-06-06 ENCOUNTER — Other Ambulatory Visit (HOSPITAL_COMMUNITY): Payer: Self-pay | Admitting: *Deleted

## 2013-06-06 DIAGNOSIS — Z01812 Encounter for preprocedural laboratory examination: Secondary | ICD-10-CM | POA: Insufficient documentation

## 2013-06-06 HISTORY — DX: Cardiac murmur, unspecified: R01.1

## 2013-06-06 HISTORY — DX: Chronic obstructive pulmonary disease, unspecified: J44.9

## 2013-06-06 HISTORY — DX: Pneumonia, unspecified organism: J18.9

## 2013-06-06 LAB — SURGICAL PCR SCREEN
MRSA, PCR: NEGATIVE
Staphylococcus aureus: NEGATIVE

## 2013-06-06 LAB — BASIC METABOLIC PANEL
BUN: 24 mg/dL — AB (ref 6–23)
CALCIUM: 9.3 mg/dL (ref 8.4–10.5)
CO2: 22 mEq/L (ref 19–32)
CREATININE: 0.89 mg/dL (ref 0.50–1.10)
Chloride: 107 mEq/L (ref 96–112)
GFR calc Af Amer: 67 mL/min — ABNORMAL LOW (ref >90)
GFR, EST NON AFRICAN AMERICAN: 58 mL/min — AB (ref >90)
Glucose, Bld: 131 mg/dL — ABNORMAL HIGH (ref 70–99)
Potassium: 4.6 mEq/L (ref 3.7–5.3)
Sodium: 144 mEq/L (ref 137–147)

## 2013-06-06 LAB — CBC
HCT: 36.7 % (ref 36.0–46.0)
Hemoglobin: 11.7 g/dL — ABNORMAL LOW (ref 12.0–15.0)
MCH: 31.4 pg (ref 26.0–34.0)
MCHC: 31.9 g/dL (ref 30.0–36.0)
MCV: 98.4 fL (ref 78.0–100.0)
PLATELETS: 279 10*3/uL (ref 150–400)
RBC: 3.73 MIL/uL — AB (ref 3.87–5.11)
RDW: 15.8 % — ABNORMAL HIGH (ref 11.5–15.5)
WBC: 10.6 10*3/uL — ABNORMAL HIGH (ref 4.0–10.5)

## 2013-06-06 NOTE — Pre-Procedure Instructions (Signed)
TYNLEE BAYLE  06/06/2013   Your procedure is scheduled on:  Thursday, June 16, 2013 at 1:15 PM.   Report to [facility] Entrance "A" Admitting Office at 10:15 AM.   Call this number if you have problems the morning of surgery: [phone]   Remember:   Do not eat food or drink liquids after midnight Wednesday, 06/15/13.    Take these medicines the morning of surgery with A SIP OF WATER: levothyroxine (SYNTHROID, LEVOTHROID), midodrine (PROAMATINE), omeprazole (PRILOSEC), predniSONE (DELTASONE), fludrocortisone (FLORINEF), sertraline (ZOLOFT), oxyCODONE-acetaminophen (PERCOCET/ROXICET) - if needed,    Do not wear jewelry, make-up or nail polish.  Do not wear lotions, powders, or perfumes. You may wear deodorant.  Do not shave 48 hours prior to surgery.   Do not bring valuables to the hospital.  [facility] is not responsible                  for any belongings or valuables.               Contacts, dentures or bridgework may not be worn into surgery.  Leave suitcase in the car. After surgery it may be brought to your room.  For patients admitted to the hospital, discharge time is determined by your                treatment team.               Patients discharged the day of surgery will not be allowed to drive home.  Name and phone number of your driver: Family/friend   Special Instructions: [place] - Preparing for Surgery  Before surgery, you can play an important role.  Because skin is not sterile, your skin needs to be as free of germs as possible.  You can reduce the number of germs on you skin by washing with CHG (chlorahexidine gluconate) soap before surgery.  CHG is an antiseptic cleaner which kills germs and bonds with the skin to continue killing germs even after washing.  Please DO NOT use if you have an allergy to CHG or antibacterial soaps.  If your skin becomes reddened/irritated stop using the CHG and inform your nurse when you arrive at Short Stay.  Do not  shave (including legs and underarms) for at least 48 hours prior to the first CHG shower.  You may shave your face.  Please follow these instructions carefully:   1.  Shower with CHG Soap the night before surgery and the                                morning of Surgery.  2.  If you choose to wash your hair, wash your hair first as usual with your       normal shampoo.  3.  After you shampoo, rinse your hair and body thoroughly to remove the                      Shampoo.  4.  Use CHG as you would any other liquid soap.  You can apply chg directly       to the skin and wash gently with scrungie or a clean washcloth.  5.  Apply the CHG Soap to your body ONLY FROM THE NECK DOWN.        Do not use on open wounds or open sores.  Avoid contact with your eyes, ears,  mouth and genitals (private parts).  Wash genitals (private parts) with your normal soap.  6.  Wash thoroughly, paying special attention to the area where your surgery        will be performed.  7.  Thoroughly rinse your body with warm water from the neck down.  8.  DO NOT shower/wash with your normal soap after using and rinsing off       the CHG Soap.  9.  Pat yourself dry with a clean towel.            10.  Wear clean pajamas.            11.  Place clean sheets on your bed the night of your first shower and do not        sleep with pets.  Day of Surgery  Do not apply any lotions the morning of surgery.  Please wear clean clothes to the hospital/surgery center.     Please read over the following fact sheets that you were given: Pain Booklet, Coughing and Deep Breathing, MRSA Information and Surgical Site Infection Prevention

## 2013-06-07 NOTE — Progress Notes (Signed)
Notified Windle Guard, Rep,  for [employer] of pt's surgery date and time.

## 2013-06-08 ENCOUNTER — Encounter (HOSPITAL_COMMUNITY): Payer: Self-pay | Admitting: Pharmacy Technician

## 2013-06-09 NOTE — Progress Notes (Signed)
HPI: Kaitlyn Cooper is an 80 year old patient of Dr. Crissie Sickles who will need to be assigned either to Dr. Pennelope Bracken or Dr. Harl Bowie for ongoing cardiac management. We are following for ongoing assessment and management of bradycardia with pacemaker in situ this is a St. Jude with recent interrogation on January 2015. The patient is sent been admitted to the hospital in the setting of hypocortisolism and adrenal insufficiency. The patient also had severe protein calorie malnutrition, and acute renal injury.  Due to hypotension, the patient was started on Midodrin, TED hose were applied. The patient had a 2-D echogram completed with an EF of 65-70% with grade 1 diastolic dysfunction with ventricular septal dyssynergy consistent with right ventricular pacing. She is being followed by Dr. Dorris Fetch for adrenal insufficiency.   She comes today frail, complaining of abdominal pain and back pain. She thinks she is on too much medicine. She also is not hungry, has not been eating well. She is due to be seen on Monday to have a outpatient surgical procedure on her back. She denies chest pain.     Allergies  Allergen Reactions  . Penicillins Rash    Current Outpatient Prescriptions  Medication Sig Dispense Refill  . acetaminophen (TYLENOL) 500 MG tablet Take 1,000 mg by mouth every 6 (six) hours as needed. Pain      . Cholecalciferol (VITAMIN D) 400 UNITS capsule Take 400 Units by mouth daily.        . cyanocobalamin (,VITAMIN B-12,) 1000 MCG/ML injection Inject 1 mL (1,000 mcg total) into the muscle every 30 (thirty) days.  1 mL  5  . docusate sodium (COLACE) 100 MG capsule Take 100 mg by mouth daily as needed for mild constipation.       . feeding supplement, ENSURE COMPLETE, (ENSURE COMPLETE) LIQD Take 237 mLs by mouth 3 (three) times daily with meals.      . fludrocortisone (FLORINEF) 0.1 MG tablet Take 0.1 mg by mouth daily.      Marland Kitchen levothyroxine (SYNTHROID, LEVOTHROID) 75 MCG tablet Take 1 tablet (75  mcg total) by mouth daily before breakfast.  30 tablet  5  . loperamide (IMODIUM A-D) 2 MG tablet Take 2 mg by mouth daily as needed for diarrhea or loose stools.      . megestrol (MEGACE) 20 MG tablet Take 20 mg by mouth daily.      . midodrine (PROAMATINE) 5 MG tablet Take 1 tablet (5 mg total) by mouth 3 (three) times daily.  90 tablet  3  . omeprazole (PRILOSEC) 20 MG capsule Take 1 capsule (20 mg total) by mouth daily.  30 capsule  12  . oxyCODONE-acetaminophen (PERCOCET/ROXICET) 5-325 MG per tablet Take 1 tablet by mouth 4 (four) times daily as needed for severe pain.  120 tablet  0  . predniSONE (DELTASONE) 10 MG tablet Take 1 tablet (10 mg total) by mouth daily. For treatment of adrenal insufficiency.  30 tablet  3  . sertraline (ZOLOFT) 25 MG tablet Take 1 tablet (25 mg total) by mouth daily.  30 tablet  6   No current facility-administered medications for this visit.    Past Medical History  Diagnosis Date  . Anemia     followed by Dr.Neijstrom  . Hyperlipidemia   . Hypothyroidism   . History of atherosclerotic cardiovascular disease   . History of colonoscopy     02/2002 showed few diverticula at sigmoid colon otherwise normal colonoscopy and terminal ileoscopy 1995 several colon polyps ,tubular  adenoma  . GERD (gastroesophageal reflux disease)     chronic  . GERD (gastroesophageal reflux disease)     chronic gerd  . Tiredness   . T9 vertebral fracture 02/09/13    result of fall at home  . Adrenal insufficiency 03/23/2013  . Orthostatic hypotension 03/21/2013  . Pacemaker   . Heart murmur     "years ago"  . COPD (chronic obstructive pulmonary disease)   . Pneumonia   . Depression     takes Zoloft    Past Surgical History  Procedure Laterality Date  . Insert / replace / remove pacemaker  01/17/2009    St.Jude Cardiac Pacemaker   . Cardiac catheterization  1998  . Cholecystectomy    . 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  Esophagogastroduodenoscopy (egd) with esophageal dilation  03/22/2012    Procedure: ESOPHAGOGASTRODUODENOSCOPY (EGD) WITH ESOPHAGEAL DILATION;  Surgeon: Rogene Houston, MD;  Location: [facility];  Service: Endoscopy;  Laterality: N/A;  730  . Colonoscopy N/A 12/29/2012    Procedure: COLONOSCOPY;  Surgeon: Rogene Houston, MD;  Location: [facility];  Service: Endoscopy;  Laterality: N/A;  130  . Esophagogastroduodenoscopy (egd) with esophageal dilation N/A 02/12/2013    Procedure: ESOPHAGOGASTRODUODENOSCOPY (EGD) WITH ESOPHAGEAL DILATION;  Surgeon: Rogene Houston, MD;  Location: [facility];  Service: Endoscopy;  Laterality: N/A;  . Breast surgery      lumpectomy - noncancerous  . Eye surgery Bilateral     cataract surgery   . Clavicle surgery Left   . Vaginal hysterectomy      ROS:  Review of systems complete and found to be negative unless listed above  PHYSICAL EXAM BP 127/51  Pulse 71  Ht 5\' 3"  (1.6 m)  Wt 89 lb (40.37 kg)  BMI 15.77 kg/m2 General: Well developed, well nourished, in no acute distress Head: Eyes PERRLA, No xanthomas.   Normal cephalic and atramatic  Lungs: Clear bilaterally to auscultation and percussion. Heart: HRRR S1 S2, without MRG.  Pulses are 2+ & equal.            No carotid bruit. No JVD.  No abdominal bruits. No femoral bruits. Abdomen: Bowel sounds are positive, hyperactive, abdomen soft and non-tender without masses or                  Hernia's noted. Msk:  Back normal, normal gait. Normal strength and tone for age. Extremities: No clubbing, cyanosis or edema.  DP +1 Neuro: Alert and oriented X 3. Psych:  Good affect, responds appropriately   ASSESSMENT AND PLAN

## 2013-06-10 ENCOUNTER — Encounter: Payer: Self-pay | Admitting: Adult Health

## 2013-06-10 ENCOUNTER — Ambulatory Visit (INDEPENDENT_AMBULATORY_CARE_PROVIDER_SITE_OTHER): Payer: Medicare Other | Admitting: Adult Health

## 2013-06-10 VITALS — BP 127/51 | HR 71 | Ht 63.0 in | Wt 89.0 lb

## 2013-06-10 DIAGNOSIS — I498 Other specified cardiac arrhythmias: Secondary | ICD-10-CM

## 2013-06-10 DIAGNOSIS — I2510 Atherosclerotic heart disease of native coronary artery without angina pectoris: Secondary | ICD-10-CM

## 2013-06-10 NOTE — Patient Instructions (Signed)
Your physician wants you to follow-up in: 6 months You will receive a reminder letter in the mail two months in advance. If you don't receive a letter, please call our office to schedule the follow-up appointment.   Your physician recommends that you continue on your current medications as directed. Please refer to the Current Medication list given to you today.   Take over the counter Gas-X (simethicone)  For gas pain     Thank you for choosing [facility] !

## 2013-06-10 NOTE — Progress Notes (Deleted)
Name: Kaitlyn Cooper    DOB: 1926-10-19  Age: 80 y.o.  MR#: [id number]       PCP:  Sallee Lange, MD      Insurance: Payor: MEDICARE / Plan: MEDICARE PART A AND B / Product Type: *No Product type* /   CC:    Chief Complaint  Patient presents with  . Bradycardia    VS Filed Vitals:   06/10/13 1518  BP: 127/51  Pulse: 71  Height: 5\' 3"  (1.6 m)  Weight: 89 lb (40.37 kg)    Weights Current Weight  06/10/13 89 lb (40.37 kg)  06/06/13 91 lb 4.8 oz (41.413 kg)  05/02/13 90 lb (40.824 kg)    Blood Pressure  BP Readings from Last 3 Encounters:  06/10/13 127/51  06/06/13 195/72  05/02/13 131/56     Admit date:  (Not on file) Last encounter with RMR:  03/07/2013   Allergy Penicillins  Current Outpatient Prescriptions  Medication Sig Dispense Refill  . acetaminophen (TYLENOL) 500 MG tablet Take 1,000 mg by mouth every 6 (six) hours as needed. Pain      . Cholecalciferol (VITAMIN D) 400 UNITS capsule Take 400 Units by mouth daily.        . cyanocobalamin (,VITAMIN B-12,) 1000 MCG/ML injection Inject 1 mL (1,000 mcg total) into the muscle every 30 (thirty) days.  1 mL  5  . docusate sodium (COLACE) 100 MG capsule Take 100 mg by mouth daily as needed for mild constipation.       . feeding supplement, ENSURE COMPLETE, (ENSURE COMPLETE) LIQD Take 237 mLs by mouth 3 (three) times daily with meals.      . fludrocortisone (FLORINEF) 0.1 MG tablet Take 0.1 mg by mouth daily.      Marland Kitchen levothyroxine (SYNTHROID, LEVOTHROID) 75 MCG tablet Take 1 tablet (75 mcg total) by mouth daily before breakfast.  30 tablet  5  . loperamide (IMODIUM A-D) 2 MG tablet Take 2 mg by mouth daily as needed for diarrhea or loose stools.      . megestrol (MEGACE) 20 MG tablet Take 20 mg by mouth daily.      . midodrine (PROAMATINE) 5 MG tablet Take 1 tablet (5 mg total) by mouth 3 (three) times daily.  90 tablet  3  . omeprazole (PRILOSEC) 20 MG capsule Take 1 capsule (20 mg total) by mouth daily.  30 capsule  12  .  oxyCODONE-acetaminophen (PERCOCET/ROXICET) 5-325 MG per tablet Take 1 tablet by mouth 4 (four) times daily as needed for severe pain.  120 tablet  0  . predniSONE (DELTASONE) 10 MG tablet Take 1 tablet (10 mg total) by mouth daily. For treatment of adrenal insufficiency.  30 tablet  3  . sertraline (ZOLOFT) 25 MG tablet Take 1 tablet (25 mg total) by mouth daily.  30 tablet  6   No current facility-administered medications for this visit.    Discontinued Meds:   There are no discontinued medications.  Patient Active Problem List   Diagnosis Date Noted  . Benign paroxysmal positional vertigo 03/24/2013  . Adrenal insufficiency 03/23/2013  . Hypocortisolism 03/22/2013  . Orthostatic hypotension 03/21/2013  . Protein-calorie malnutrition, severe 03/19/2013  . Leukocytosis 03/18/2013  . Generalized weakness 03/18/2013  . Acute kidney injury 03/18/2013  . Hyperkalemia 03/08/2013  . Iron deficiency anemia 03/04/2013  . Dysphagia, unspecified(787.20) 03/03/2013  . Loss of weight 03/03/2013  . Fever, unspecified 02/20/2013  . Acute renal failure 02/11/2013  . Chronic diarrhea 02/10/2013  . Wedge  compression fracture of T9 vertebra 02/10/2013  . Dehydration 02/10/2013  . Gastroenteritis 02/09/2013  . Weakness 02/03/2013  . Pernicious anemia 05/17/2012  . Esophageal stricture 03/16/2012  . Family hx of colon cancer 03/16/2012  . PPM-St.Jude 04/25/2009  . COLONIC POLYPS, ADENOMATOUS 03/07/2009  . DYSPHAGIA 03/07/2009  . DEPRESSION 03/06/2009  . GASTROESOPHAGEAL REFLUX DISEASE, CHRONIC 03/06/2009  . OSTEOPOROSIS 03/06/2009  . OSTEOPENIA 03/06/2009  . DIARRHEA, CHRONIC 03/06/2009  . COLONIC POLYPS, HX OF 03/06/2009  . BRADYCARDIA 01/17/2009  . HYPOTHYROIDISM 10/24/2008  . HYPERLIPIDEMIA 10/24/2008  . ATHEROSCLEROTIC CARDIOVASCULAR DISEASE 10/24/2008    LABS    Component Value Date/Time   NA 144 06/06/2013 1537   NA 142 05/02/2013 0950   NA 140 03/25/2013 0609   K 4.6 06/06/2013  1537   K 3.7 05/02/2013 0950   K 4.3 03/25/2013 0609   CL 107 06/06/2013 1537   CL 103 05/02/2013 0950   CL 105 03/25/2013 0609   CO2 22 06/06/2013 1537   CO2 26 05/02/2013 0950   CO2 24 03/25/2013 0609   GLUCOSE 131* 06/06/2013 1537   GLUCOSE 93 05/02/2013 0950   GLUCOSE 87 03/25/2013 0609   BUN 24* 06/06/2013 1537   BUN 32* 05/02/2013 0950   BUN 21 03/25/2013 0609   CREATININE 0.89 06/06/2013 1537   CREATININE 1.00 05/02/2013 0950   CREATININE 0.91 03/25/2013 0609   CREATININE 1.19* 11/25/2012 0822   CREATININE 1.05 05/27/2012 1553   CREATININE 1.19 08/29/2008 0934   CALCIUM 9.3 06/06/2013 1537   CALCIUM 9.7 05/02/2013 0950   CALCIUM 9.3 03/25/2013 0609   GFRNONAA 58* 06/06/2013 1537   GFRNONAA 50* 05/02/2013 0950   GFRNONAA 56* 03/25/2013 0609   GFRAA 67* 06/06/2013 1537   GFRAA 58* 05/02/2013 0950   GFRAA 65* 03/25/2013 0609   CMP     Component Value Date/Time   NA 144 06/06/2013 1537   K 4.6 06/06/2013 1537   CL 107 06/06/2013 1537   CO2 22 06/06/2013 1537   GLUCOSE 131* 06/06/2013 1537   BUN 24* 06/06/2013 1537   CREATININE 0.89 06/06/2013 1537   CREATININE 1.19* 11/25/2012 0822   CREATININE 1.19 08/29/2008 0934   CALCIUM 9.3 06/06/2013 1537   PROT 8.3 03/18/2013 1649   ALBUMIN 3.5 03/18/2013 1649   AST 19 03/18/2013 1649   ALT 9 03/18/2013 1649   ALKPHOS 90 03/18/2013 1649   BILITOT 0.3 03/18/2013 1649   GFRNONAA 58* 06/06/2013 1537   GFRAA 67* 06/06/2013 1537       Component Value Date/Time   WBC 10.6* 06/06/2013 1537   WBC 10.0 05/02/2013 0950   WBC 8.3 03/24/2013 0608   HGB 11.7* 06/06/2013 1537   HGB 11.3* 05/02/2013 0950   HGB 9.9* 03/24/2013 0608   HGB 11.0* 02/01/2013 1039   HCT 36.7 06/06/2013 1537   HCT 34.7* 05/02/2013 0950   HCT 30.3* 03/24/2013 0608   MCV 98.4 06/06/2013 1537   MCV 95.9 05/02/2013 0950   MCV 93.8 03/24/2013 0608    Lipid Panel     Component Value Date/Time   CHOL 191 11/25/2012 0822   TRIG 156* 11/25/2012 0822   HDL 57 11/25/2012 0822   CHOLHDL 3.4 11/25/2012 0822   VLDL 31  11/25/2012 0822   LDLCALC 103* 11/25/2012 0822    ABG No results found for this basename: phart, pco2, pco2art, po2, po2art, hco3, tco2, acidbasedef, o2sat     Lab Results  Component Value Date   TSH 0.350 03/20/2013   BNP (last  3 results) No results found for this basename: PROBNP,  in the last 8760 hours Cardiac Panel (last 3 results) No results found for this basename: CKTOTAL, CKMB, TROPONINI, RELINDX,  in the last 72 hours  Iron/TIBC/Ferritin    Component Value Date/Time   IRON 32* 03/04/2013 1455   TIBC 225* 03/04/2013 1455   FERRITIN 82 03/04/2013 1455     EKG Orders placed during the hospital encounter of 03/18/13  . EKG  . EKG 12-LEAD  . EKG 12-LEAD  . EKG 12-LEAD  . EKG 12-LEAD     Prior Assessment and Plan Problem List as of 06/10/2013     Cardiovascular and Mediastinum   BRADYCARDIA   Last Assessment & Plan   05/27/2012 Office Visit Written 05/27/2012  3:47 PM by Lendon Colonel, NP     A St. Jude's pacemaker in situ. She is due to have her pacemaker interrogated with office visit with Dr. Lovena Le for annual evaluation. Limit will be made prior to her leaving the office.    ATHEROSCLEROTIC CARDIOVASCULAR DISEASE   Orthostatic hypotension     Digestive   COLONIC POLYPS, ADENOMATOUS   GASTROESOPHAGEAL REFLUX DISEASE, CHRONIC   DYSPHAGIA   Esophageal stricture   Gastroenteritis   Chronic diarrhea   Dysphagia, unspecified(787.20)     Endocrine   HYPOTHYROIDISM   Hypocortisolism   Adrenal insufficiency     Nervous and Auditory   Benign paroxysmal positional vertigo     Musculoskeletal and Integument   OSTEOPOROSIS   OSTEOPENIA   Wedge compression fracture of T9 vertebra     Genitourinary   Acute renal failure   Acute kidney injury     Other   Pernicious anemia   Last Assessment & Plan   03/07/2013 Office Visit Written 03/07/2013  4:04 PM by Lendon Colonel, NP     CBC is ordered for status    HYPERLIPIDEMIA   Last Assessment & Plan    06/04/2010 Office Visit Written 06/04/2010  9:07 PM by Evans Lance, MD     Continue a low fat diet and statin therapy.    DEPRESSION   Last Assessment & Plan   03/07/2013 Office Visit Written 03/07/2013  4:07 PM by Lendon Colonel, NP     Restarted on Zoloft.    DIARRHEA, CHRONIC   Last Assessment & Plan   05/27/2012 Office Visit Written 05/27/2012  3:48 PM by Lendon Colonel, NP     Recommend followup with primary care physician or GI in continued symptoms to avoid dehydration.    COLONIC POLYPS, HX OF   PPM-St.Jude   Last Assessment & Plan   03/07/2013 Office Visit Written 03/07/2013  4:04 PM by Lendon Colonel, NP     Recently checked in December. Functioning appropriately.    Family hx of colon cancer   Weakness   Dehydration   Last Assessment & Plan   03/07/2013 Office Visit Written 03/07/2013  4:06 PM by Lendon Colonel, NP     Will check labs to evaluate need for more hydration. She continue to have chronic diarrhea. Will defer to PCP for treatment. Taken off of diphenhydramine.     Fever, unspecified   Loss of weight   Last Assessment & Plan   03/07/2013 Office Visit Written 03/07/2013  4:03 PM by Lendon Colonel, NP     Being followed by Dr. Wolfgang Phoenix and is on megace. Encouraged to eat whatever she wants. She is still mourning the loss of her  husband and is depressed.    Iron deficiency anemia   Hyperkalemia   Leukocytosis   Generalized weakness   Protein-calorie malnutrition, severe       Imaging: No results found.

## 2013-06-10 NOTE — Assessment & Plan Note (Signed)
Doing well from a cardiovascular standpoint. This is the least of her problems. She comes with multiple somatic complaints most notably abdominal pain and lower back pain. Doubt mesenteric ischemia causing her bowel or pain.  She will continue her current medication regimen. We will see her again in 6 months unless symptomatic.

## 2013-06-10 NOTE — Assessment & Plan Note (Signed)
She is status post St. Jude pacemaker implantation. She will continue to follow Dr. Lovena Le for ongoing interrogation either remotely or on site with his appointment

## 2013-06-15 MED ORDER — VANCOMYCIN HCL IN DEXTROSE 1-5 GM/200ML-% IV SOLN
1000.0000 mg | INTRAVENOUS | Status: AC
  Administered 2013-06-16: 1000 mg via INTRAVENOUS
  Filled 2013-06-15: qty 200

## 2013-06-16 ENCOUNTER — Encounter (HOSPITAL_COMMUNITY)
Admission: RE | Disposition: A | Discharge status: Home / Self Care | Payer: Self-pay | Source: Ambulatory Visit | Attending: Neurosurgery

## 2013-06-16 ENCOUNTER — Encounter (HOSPITAL_COMMUNITY): Payer: Self-pay | Admitting: Certified Registered"

## 2013-06-16 ENCOUNTER — Ambulatory Visit (HOSPITAL_COMMUNITY): Payer: Medicare Other

## 2013-06-16 ENCOUNTER — Encounter (HOSPITAL_COMMUNITY): Payer: Medicare Other | Admitting: Anesthesiology

## 2013-06-16 ENCOUNTER — Inpatient Hospital Stay (HOSPITAL_COMMUNITY): Payer: Medicare Other | Admitting: Anesthesiology

## 2013-06-16 ENCOUNTER — Ambulatory Visit (HOSPITAL_COMMUNITY)
Admission: RE | Admit: 2013-06-16 | Discharge: 2013-06-17 | Disposition: A | Discharge status: Home / Self Care | Payer: Medicare Other | Source: Ambulatory Visit | Attending: Neurosurgery | Admitting: Neurosurgery

## 2013-06-16 DIAGNOSIS — J449 Chronic obstructive pulmonary disease, unspecified: Secondary | ICD-10-CM | POA: Insufficient documentation

## 2013-06-16 DIAGNOSIS — S22009A Unspecified fracture of unspecified thoracic vertebra, initial encounter for closed fracture: Secondary | ICD-10-CM

## 2013-06-16 DIAGNOSIS — E039 Hypothyroidism, unspecified: Secondary | ICD-10-CM | POA: Insufficient documentation

## 2013-06-16 DIAGNOSIS — Z87891 Personal history of nicotine dependence: Secondary | ICD-10-CM

## 2013-06-16 DIAGNOSIS — Z950 Presence of cardiac pacemaker: Secondary | ICD-10-CM | POA: Insufficient documentation

## 2013-06-16 DIAGNOSIS — F329 Major depressive disorder, single episode, unspecified: Secondary | ICD-10-CM

## 2013-06-16 DIAGNOSIS — J4489 Other specified chronic obstructive pulmonary disease: Secondary | ICD-10-CM | POA: Insufficient documentation

## 2013-06-16 DIAGNOSIS — IMO0002 Reserved for concepts with insufficient information to code with codable children: Secondary | ICD-10-CM

## 2013-06-16 DIAGNOSIS — E785 Hyperlipidemia, unspecified: Secondary | ICD-10-CM

## 2013-06-16 DIAGNOSIS — D649 Anemia, unspecified: Secondary | ICD-10-CM | POA: Insufficient documentation

## 2013-06-16 DIAGNOSIS — K219 Gastro-esophageal reflux disease without esophagitis: Secondary | ICD-10-CM | POA: Insufficient documentation

## 2013-06-16 DIAGNOSIS — F3289 Other specified depressive episodes: Secondary | ICD-10-CM | POA: Insufficient documentation

## 2013-06-16 DIAGNOSIS — W19XXXA Unspecified fall, initial encounter: Secondary | ICD-10-CM

## 2013-06-16 DIAGNOSIS — S22000A Wedge compression fracture of unspecified thoracic vertebra, initial encounter for closed fracture: Secondary | ICD-10-CM | POA: Diagnosis present

## 2013-06-16 HISTORY — PX: KYPHOPLASTY: SHX5884

## 2013-06-16 SURGERY — KYPHOPLASTY
Anesthesia: General

## 2013-06-16 MED ORDER — ONDANSETRON HCL 4 MG/2ML IJ SOLN
INTRAMUSCULAR | Status: DC | PRN
  Administered 2013-06-16: 4 mg via INTRAVENOUS

## 2013-06-16 MED ORDER — ONDANSETRON HCL 4 MG/2ML IJ SOLN
4.0000 mg | INTRAMUSCULAR | Status: DC | PRN
  Filled 2013-06-16: qty 2

## 2013-06-16 MED ORDER — DROPERIDOL 2.5 MG/ML IJ SOLN
0.6250 mg | INTRAMUSCULAR | Status: DC | PRN
  Filled 2013-06-16: qty 0.25

## 2013-06-16 MED ORDER — PHENOL 1.4 % MT LIQD: 1.0000 | OROMUCOSAL | Status: DC | PRN

## 2013-06-16 MED ORDER — LACTATED RINGERS IV SOLN: INTRAVENOUS | Status: DC

## 2013-06-16 MED ORDER — BUPIVACAINE-EPINEPHRINE 0.5% -1:200000 IJ SOLN
INTRAMUSCULAR | Status: DC | PRN
  Administered 2013-06-16: 10 mL

## 2013-06-16 MED ORDER — SERTRALINE HCL 25 MG PO TABS
25.0000 mg | ORAL_TABLET | Freq: Every day | ORAL | Status: DC
  Administered 2013-06-17: 25 mg via ORAL
  Filled 2013-06-16: qty 1

## 2013-06-16 MED ORDER — FENTANYL CITRATE 0.05 MG/ML IJ SOLN: 25.0000 ug | INTRAMUSCULAR | Status: DC | PRN

## 2013-06-16 MED ORDER — OXYCODONE-ACETAMINOPHEN 5-325 MG PO TABS: 1.0000 | ORAL_TABLET | Freq: Four times a day (QID) | ORAL | Status: DC | PRN

## 2013-06-16 MED ORDER — SUCCINYLCHOLINE CHLORIDE 20 MG/ML IJ SOLN
INTRAMUSCULAR | Status: DC | PRN
  Administered 2013-06-16: 60 mg via INTRAVENOUS

## 2013-06-16 MED ORDER — DIAZEPAM 5 MG PO TABS: 5.0000 mg | ORAL_TABLET | Freq: Four times a day (QID) | ORAL | Status: DC | PRN

## 2013-06-16 MED ORDER — VANCOMYCIN HCL 500 MG IV SOLR
500.0000 mg | Freq: Once | INTRAVENOUS | Status: AC
  Administered 2013-06-17: 500 mg via INTRAVENOUS
  Filled 2013-06-16: qty 500

## 2013-06-16 MED ORDER — MIDAZOLAM HCL 5 MG/5ML IJ SOLN
INTRAMUSCULAR | Status: DC | PRN
  Administered 2013-06-16 (×2): 1 mg via INTRAVENOUS

## 2013-06-16 MED ORDER — PANTOPRAZOLE SODIUM 40 MG PO TBEC: 40.0000 mg | DELAYED_RELEASE_TABLET | Freq: Every day | ORAL | Status: DC

## 2013-06-16 MED ORDER — MIDODRINE HCL 5 MG PO TABS
5.0000 mg | ORAL_TABLET | Freq: Three times a day (TID) | ORAL | Status: DC
  Administered 2013-06-16 – 2013-06-17 (×2): 5 mg via ORAL
  Filled 2013-06-16 (×5): qty 1

## 2013-06-16 MED ORDER — FENTANYL CITRATE 0.05 MG/ML IJ SOLN
INTRAMUSCULAR | Status: DC | PRN
  Administered 2013-06-16: 100 ug via INTRAVENOUS
  Administered 2013-06-16: 50 ug via INTRAVENOUS

## 2013-06-16 MED ORDER — BACITRACIN ZINC 500 UNIT/GM EX OINT
TOPICAL_OINTMENT | CUTANEOUS | Status: DC | PRN
  Administered 2013-06-16: 1 via TOPICAL

## 2013-06-16 MED ORDER — MIDAZOLAM HCL 2 MG/2ML IJ SOLN
INTRAMUSCULAR | Status: AC
Start: 2013-06-16 — End: 2013-06-16
  Filled 2013-06-16: qty 2

## 2013-06-16 MED ORDER — FENTANYL CITRATE 0.05 MG/ML IJ SOLN
INTRAMUSCULAR | Status: AC
  Filled 2013-06-16: qty 5

## 2013-06-16 MED ORDER — MENTHOL 3 MG MT LOZG: 1.0000 | LOZENGE | OROMUCOSAL | Status: DC | PRN

## 2013-06-16 MED ORDER — ACETAMINOPHEN 650 MG RE SUPP: 650.0000 mg | RECTAL | Status: DC | PRN

## 2013-06-16 MED ORDER — BACITRACIN 50000 UNITS IM SOLR
INTRAMUSCULAR | Status: DC | PRN
  Administered 2013-06-16: 12:00:00

## 2013-06-16 MED ORDER — PREDNISONE 10 MG PO TABS
10.0000 mg | ORAL_TABLET | Freq: Every day | ORAL | Status: DC
  Administered 2013-06-17: 10 mg via ORAL
  Filled 2013-06-16: qty 1

## 2013-06-16 MED ORDER — ONDANSETRON HCL 4 MG/2ML IJ SOLN
INTRAMUSCULAR | Status: AC
  Filled 2013-06-16: qty 2

## 2013-06-16 MED ORDER — MORPHINE SULFATE 2 MG/ML IJ SOLN: 1.0000 mg | INTRAMUSCULAR | Status: DC | PRN

## 2013-06-16 MED ORDER — LIDOCAINE HCL (CARDIAC) 20 MG/ML IV SOLN
INTRAVENOUS | Status: AC
  Filled 2013-06-16: qty 5

## 2013-06-16 MED ORDER — ACETAMINOPHEN 325 MG PO TABS: 650.0000 mg | ORAL_TABLET | ORAL | Status: DC | PRN

## 2013-06-16 MED ORDER — PROPOFOL 10 MG/ML IV BOLUS
INTRAVENOUS | Status: AC
  Filled 2013-06-16: qty 20

## 2013-06-16 MED ORDER — METHYLPREDNISOLONE SODIUM SUCC 125 MG IJ SOLR
60.0000 mg | Freq: Four times a day (QID) | INTRAMUSCULAR | Status: AC
  Administered 2013-06-16 – 2013-06-17 (×3): 60 mg via INTRAVENOUS
  Filled 2013-06-16 (×3): qty 0.96

## 2013-06-16 MED ORDER — LIDOCAINE HCL (CARDIAC) 20 MG/ML IV SOLN
INTRAVENOUS | Status: DC | PRN
  Administered 2013-06-16: 10 mg via INTRAVENOUS

## 2013-06-16 MED ORDER — FLUDROCORTISONE ACETATE 0.1 MG PO TABS
0.1000 mg | ORAL_TABLET | Freq: Every day | ORAL | Status: DC
  Administered 2013-06-17: 0.1 mg via ORAL
  Filled 2013-06-16: qty 1

## 2013-06-16 MED ORDER — PROPOFOL 10 MG/ML IV BOLUS
INTRAVENOUS | Status: DC | PRN
  Administered 2013-06-16: 65 mg via INTRAVENOUS

## 2013-06-16 MED ORDER — OXYCODONE-ACETAMINOPHEN 5-325 MG PO TABS: 1.0000 | ORAL_TABLET | ORAL | Status: DC | PRN

## 2013-06-16 MED ORDER — ACETAMINOPHEN 500 MG PO TABS
1000.0000 mg | ORAL_TABLET | Freq: Four times a day (QID) | ORAL | Status: DC | PRN
  Administered 2013-06-17: 1000 mg via ORAL
  Filled 2013-06-16: qty 2

## 2013-06-16 MED ORDER — DOCUSATE SODIUM 100 MG PO CAPS
100.0000 mg | ORAL_CAPSULE | Freq: Two times a day (BID) | ORAL | Status: DC
  Administered 2013-06-16 – 2013-06-17 (×2): 100 mg via ORAL
  Filled 2013-06-16 (×3): qty 1

## 2013-06-16 MED ORDER — LACTATED RINGERS IV SOLN
INTRAVENOUS | Status: DC
  Administered 2013-06-16: 11:00:00 via INTRAVENOUS

## 2013-06-16 MED ORDER — IOHEXOL 300 MG/ML  SOLN
INTRAMUSCULAR | Status: DC | PRN
  Administered 2013-06-16: 50 mL

## 2013-06-16 MED ORDER — MEGESTROL ACETATE 20 MG PO TABS
20.0000 mg | ORAL_TABLET | Freq: Every day | ORAL | Status: DC
  Administered 2013-06-17: 20 mg via ORAL
  Filled 2013-06-16: qty 1

## 2013-06-16 MED ORDER — 0.9 % SODIUM CHLORIDE (POUR BTL) OPTIME
TOPICAL | Status: DC | PRN
  Administered 2013-06-16 (×2): 1000 mL

## 2013-06-16 MED ORDER — HYDROCODONE-ACETAMINOPHEN 5-325 MG PO TABS
1.0000 | ORAL_TABLET | ORAL | Status: DC | PRN
  Administered 2013-06-17: 1 via ORAL
  Filled 2013-06-16: qty 1

## 2013-06-16 MED ORDER — ROCURONIUM BROMIDE 50 MG/5ML IV SOLN
INTRAVENOUS | Status: AC
  Filled 2013-06-16: qty 1

## 2013-06-16 MED ORDER — LEVOTHYROXINE SODIUM 75 MCG PO TABS
75.0000 ug | ORAL_TABLET | Freq: Every day | ORAL | Status: DC
  Administered 2013-06-17: 75 ug via ORAL
  Filled 2013-06-16 (×2): qty 1

## 2013-06-16 MED ORDER — DOCUSATE SODIUM 100 MG PO CAPS
100.0000 mg | ORAL_CAPSULE | Freq: Every day | ORAL | Status: DC | PRN
  Filled 2013-06-16: qty 1

## 2013-06-16 MED ORDER — ALUM & MAG HYDROXIDE-SIMETH 200-200-20 MG/5ML PO SUSP: 30.0000 mL | Freq: Four times a day (QID) | ORAL | Status: DC | PRN

## 2013-06-16 SURGICAL SUPPLY — 43 items
BENZOIN TINCTURE PRP APPL 2/3 (GAUZE/BANDAGES/DRESSINGS) ×3 IMPLANT
BLADE 10 SAFETY STRL DISP (BLADE) ×3 IMPLANT
BLADE SURG 15 STRL LF DISP TIS (BLADE) ×1 IMPLANT
BLADE SURG 15 STRL SS (BLADE) ×2
BLADE SURG ROTATE 9660 (MISCELLANEOUS) IMPLANT
CEMENT BONE KYPHX HV R (Orthopedic Implant) ×3 IMPLANT
CEMENT KYPHON C01A KIT/MIXER (Cement) ×3 IMPLANT
CLOSURE WOUND 1/2 X4 (GAUZE/BANDAGES/DRESSINGS) ×1
CONT SPEC 4OZ CLIKSEAL STRL BL (MISCELLANEOUS) ×3 IMPLANT
DRAPE C-ARM 42X72 X-RAY (DRAPES) ×3 IMPLANT
DRAPE INCISE IOBAN 66X45 STRL (DRAPES) ×3 IMPLANT
DRAPE LAPAROTOMY 100X72X124 (DRAPES) ×3 IMPLANT
DRAPE PROXIMA HALF (DRAPES) ×3 IMPLANT
DRAPE SURG 17X23 STRL (DRAPES) IMPLANT
GAUZE SPONGE 4X4 16PLY XRAY LF (GAUZE/BANDAGES/DRESSINGS) ×3 IMPLANT
GLOVE BIO SURGEON STRL SZ8.5 (GLOVE) ×3 IMPLANT
GLOVE EXAM NITRILE LRG STRL (GLOVE) IMPLANT
GLOVE EXAM NITRILE MD LF STRL (GLOVE) ×3 IMPLANT
GLOVE EXAM NITRILE XL STR (GLOVE) IMPLANT
GLOVE EXAM NITRILE XS STR PU (GLOVE) IMPLANT
GLOVE SS BIOGEL STRL SZ 8 (GLOVE) ×1 IMPLANT
GLOVE SUPERSENSE BIOGEL SZ 8 (GLOVE) ×2
GOWN BRE IMP SLV AUR LG STRL (GOWN DISPOSABLE) IMPLANT
GOWN BRE IMP SLV AUR XL STRL (GOWN DISPOSABLE) IMPLANT
GOWN STRL REUS W/ TWL LRG LVL3 (GOWN DISPOSABLE) ×1 IMPLANT
GOWN STRL REUS W/ TWL XL LVL3 (GOWN DISPOSABLE) ×2 IMPLANT
GOWN STRL REUS W/TWL LRG LVL3 (GOWN DISPOSABLE) ×2
GOWN STRL REUS W/TWL XL LVL3 (GOWN DISPOSABLE) ×4
KIT BASIN OR (CUSTOM PROCEDURE TRAY) ×3 IMPLANT
KIT ROOM TURNOVER OR (KITS) ×3 IMPLANT
NEEDLE HYPO 22GX1.5 SAFETY (NEEDLE) ×3 IMPLANT
NS IRRIG 1000ML POUR BTL (IV SOLUTION) ×3 IMPLANT
PACK EENT II TURBAN DRAPE (CUSTOM PROCEDURE TRAY) ×3 IMPLANT
PAD ARMBOARD 7.5X6 YLW CONV (MISCELLANEOUS) ×9 IMPLANT
SPECIMEN JAR SMALL (MISCELLANEOUS) IMPLANT
SPONGE GAUZE 4X4 12PLY (GAUZE/BANDAGES/DRESSINGS) ×3 IMPLANT
STRIP CLOSURE SKIN 1/2X4 (GAUZE/BANDAGES/DRESSINGS) ×2 IMPLANT
SUT VIC AB 3-0 SH 8-18 (SUTURE) ×3 IMPLANT
SYR CONTROL 10ML LL (SYRINGE) ×3 IMPLANT
TAPE CLOTH SURG 4X10 WHT LF (GAUZE/BANDAGES/DRESSINGS) ×3 IMPLANT
TOWEL OR 17X24 6PK STRL BLUE (TOWEL DISPOSABLE) ×3 IMPLANT
TOWEL OR 17X26 10 PK STRL BLUE (TOWEL DISPOSABLE) ×3 IMPLANT
TRAY KYPHOPAK 15/3 ONESTEP 1ST (MISCELLANEOUS) ×3 IMPLANT

## 2013-06-16 NOTE — Transfer of Care (Signed)
Immediate Anesthesia Transfer of Care Note  Patient: Kaitlyn Cooper  Procedure(s) Performed: Procedure(s) with comments: Thoracic Nine Kyphoplasty (N/A) - Thoracic Nine Kyphoplasty  Patient Location: PACU  Anesthesia Type:General  Level of Consciousness: awake  Airway & Oxygen Therapy: Patient Spontanous Breathing and Patient connected to nasal cannula oxygen  Post-op Assessment: Report given to PACU RN, Post -op Vital signs reviewed and stable and Patient moving all extremities  Post vital signs: Reviewed and stable  Complications: No apparent anesthesia complications

## 2013-06-16 NOTE — Progress Notes (Signed)
ANTIBIOTIC CONSULT NOTE - INITIAL  Pharmacy Consult for Vancomycin Indication: Post-surgical prophylaxis  Allergies  Allergen Reactions  . Penicillins Rash    Patient Measurements: Height: 5\' 3"  (160 cm) Weight: 89 lb (40.37 kg) IBW/kg (Calculated) : 52.4 Adjusted Body Weight:   Vital Signs: Temp: 97.8 F (36.6 C) (04/30 1430) Temp src: Oral (04/30 1045) BP: 171/71 mmHg (04/30 1430) Pulse Rate: 84 (04/30 1430) Intake/Output from previous day:   Intake/Output from this shift: Total I/O In: 840 [P.O.:240; I.V.:600] Out: -   Labs: No results found for this basename: WBC, HGB, PLT, LABCREA, CREATININE,  in the last 72 hours Estimated Creatinine Clearance: 30 ml/min (by C-G formula based on Cr of 0.89). No results found for this basename: VANCOTROUGH, Corlis Leak, VANCORANDOM, GENTTROUGH, GENTPEAK, GENTRANDOM, TOBRATROUGH, TOBRAPEAK, TOBRARND, AMIKACINPEAK, AMIKACINTROU, AMIKACIN,  in the last 72 hours   Microbiology: Recent Results (from the past 720 hour(s))  SURGICAL PCR SCREEN     Status: None   Collection Time    06/06/13  3:36 PM      Result Value Ref Range Status   MRSA, PCR NEGATIVE  NEGATIVE Final   Staphylococcus aureus NEGATIVE  NEGATIVE Final   Comment:            The Xpert SA Assay (FDA     approved for NASAL specimens     in patients over 11 years of age),     is one component of     a comprehensive surveillance     program.  Test performance has     been validated by [employer] for patients greater     than or equal to 35 year old.     It is not intended     to diagnose infection nor to     guide or monitor treatment.    Medical History: Past Medical History  Diagnosis Date  . Anemia     followed by Dr.Neijstrom  . Hyperlipidemia   . Hypothyroidism   . History of atherosclerotic cardiovascular disease   . History of colonoscopy     02/2002 showed few diverticula at sigmoid colon otherwise normal colonoscopy and terminal ileoscopy 1995  several colon polyps ,tubular adenoma  . GERD (gastroesophageal reflux disease)     chronic  . GERD (gastroesophageal reflux disease)     chronic gerd  . Tiredness   . T9 vertebral fracture 02/09/13    result of fall at home  . Adrenal insufficiency 03/23/2013  . Orthostatic hypotension 03/21/2013  . Pacemaker   . Heart murmur     "years ago"  . COPD (chronic obstructive pulmonary disease)   . Pneumonia   . Depression     takes Zoloft    Medications:  Prescriptions prior to admission  Medication Sig Dispense Refill  . acetaminophen (TYLENOL) 500 MG tablet Take 1,000 mg by mouth every 6 (six) hours as needed. Pain      . Cholecalciferol (VITAMIN D) 400 UNITS capsule Take 400 Units by mouth daily.        . cyanocobalamin (,VITAMIN B-12,) 1000 MCG/ML injection Inject 1 mL (1,000 mcg total) into the muscle every 30 (thirty) days.  1 mL  5  . docusate sodium (COLACE) 100 MG capsule Take 100 mg by mouth daily as needed for mild constipation.       . feeding supplement, ENSURE COMPLETE, (ENSURE COMPLETE) LIQD Take 237 mLs by mouth 3 (three) times daily with meals.      Marland Kitchen  fludrocortisone (FLORINEF) 0.1 MG tablet Take 0.1 mg by mouth daily.      Marland Kitchen levothyroxine (SYNTHROID, LEVOTHROID) 75 MCG tablet Take 1 tablet (75 mcg total) by mouth daily before breakfast.  30 tablet  5  . loperamide (IMODIUM A-D) 2 MG tablet Take 2 mg by mouth daily as needed for diarrhea or loose stools.      . megestrol (MEGACE) 20 MG tablet Take 20 mg by mouth daily.      . midodrine (PROAMATINE) 5 MG tablet Take 1 tablet (5 mg total) by mouth 3 (three) times daily.  90 tablet  3  . omeprazole (PRILOSEC) 20 MG capsule Take 1 capsule (20 mg total) by mouth daily.  30 capsule  12  . oxyCODONE-acetaminophen (PERCOCET/ROXICET) 5-325 MG per tablet Take 1 tablet by mouth 4 (four) times daily as needed for severe pain.  120 tablet  0  . predniSONE (DELTASONE) 10 MG tablet Take 1 tablet (10 mg total) by mouth daily. For treatment  of adrenal insufficiency.  30 tablet  3  . sertraline (ZOLOFT) 25 MG tablet Take 1 tablet (25 mg total) by mouth daily.  30 tablet  6   Assessment: 80yof s/p spinal procedure to receive Vancomycin for post-surgical prophylaxis due to PCN allergy. Per Op note, no drains were placed so dosing will be limited to 1 dose per protocol. Patient received Vancomycin 1g pre-op ~1140. - Wt 40kg - Crcl 30  Goal of Therapy:  Vancomycin trough level 10-15 mcg/ml  Plan:  1. Vancomycin 500mg  IV x 1 tomorrow AM 2. Pharmacy will sign off. Please reconsult if additional assistance is needed.  Patsey Berthold [facility] [phone] 06/16/2013,3:54 PM

## 2013-06-16 NOTE — Plan of Care (Signed)
Problem: Consults Goal: Diagnosis - Spinal Surgery Outcome: Completed/Met Date Met:  06/16/13 T9 KYPHOPLASTY

## 2013-06-16 NOTE — Op Note (Signed)
Brief history: The patient is an 80 year old white female who fell on 02/09/2013 suffered a T9 compression fracture. She has not improved despite time and medical management. I discussed the various treatment options with her including a T9 kyphoplasty. The patient has weighed the risks, benefits, and alternatives surgery and decided proceed with a T9 kyphoplasty.  Preop diagnosis: T9 compression fracture, thoracic back pain.  Postop diagnosis: The same  Procedure: T9 kyphoplasty  Surgeon: Dr. Earle Gell  Asst.: None  Anesthesia: Gen. endotracheal  Estimated blood loss: Minimal  Specimens: None  Complications: None  Drains: None  Description of procedure: The patient was brought to the operating room by the anesthesia team. General endotracheal anesthesia was induced. The patient was turned to the prone position on the chest rolls. The patient's thoracic region was then prepared with DuraPrep. Sterile drapes were applied. I injected the area to be incised with Marcaine with epinephrine solution. Using biplanar fluoroscopy we identified the bilateral T9 pedicles. I made a small incision over the bilateral T9 pedicles. I then cannulated the pedicles with the Jamshidi needle again under biplanar fluoroscopy. I then removed the inner cannula and drilled down the pedicle, I removed a drill, and inserted and inflated and deflated the balloon. Under biplanar fluoroscopy I then filled the T9 compression fracture with bone cement. There was some ventral displacement of the cement. After I was satisfied with the cement filling I then removed the cannulas. I then reapproximated patient's subcutaneous tissue with interrupted 3-0 Vicryl suture. I then reapproximated the skin with Steri-Strips and benzoin. The wound was then coated with bacitracin ointment. The drapes were removed. The patient was then returned to the supine position. By report all sponge, instrument, and needle counts were correct at the  end of this case.

## 2013-06-16 NOTE — Progress Notes (Signed)
Subjective:  The patient is somnolent but easily arousable. She is in no apparent distress.  Objective: Vital signs in last 24 hours: Temp:  [97.8 F (36.6 C)] 97.8 F (36.6 C) (04/30 1310) Pulse Rate:  [62-82] 82 (04/30 1310) Resp:  [17-18] 17 (04/30 1310) BP: (157-189)/(60-63) 157/60 mmHg (04/30 1310) SpO2:  [99 %-100 %] 100 % (04/30 1310) Weight:  [40.37 kg (89 lb)] 40.37 kg (89 lb) (04/30 1045)  Intake/Output from previous day:   Intake/Output this shift: Total I/O In: 350 [I.V.:350] Out: -   Physical exam the patient is somnolent but easily arousable. She is moving her lower extremities well.  Lab Results: No results found for this basename: WBC, HGB, HCT, PLT,  in the last 72 hours BMET No results found for this basename: NA, K, CL, CO2, GLUCOSE, BUN, CREATININE, CALCIUM,  in the last 72 hours  Studies/Results: No results found.  Assessment/Plan: The patient is doing well. I spoke with her family.  LOS: 0 days     Kaitlyn Cooper 06/16/2013, 1:16 PM

## 2013-06-16 NOTE — H&P (Signed)
Subjective: The patient is an 80 year old white female who fell on 02/09/2013 suffering a T9 compression fracture. She has failed medical management and was worked up with an MRI and x-rays which demonstrated a T9 compression fracture. I discussed the various treatment options with the patient including surgery. She has weighed the risks, benefits, and alternatives surgery and decided proceed with a T9 kyphoplasty.   Past Medical History  Diagnosis Date  . Anemia     followed by Dr.Neijstrom  . Hyperlipidemia   . Hypothyroidism   . History of atherosclerotic cardiovascular disease   . History of colonoscopy     02/2002 showed few diverticula at sigmoid colon otherwise normal colonoscopy and terminal ileoscopy 1995 several colon polyps ,tubular adenoma  . GERD (gastroesophageal reflux disease)     chronic  . GERD (gastroesophageal reflux disease)     chronic gerd  . Tiredness   . T9 vertebral fracture 02/09/13    result of fall at home  . Adrenal insufficiency 03/23/2013  . Orthostatic hypotension 03/21/2013  . Pacemaker   . Heart murmur     "years ago"  . COPD (chronic obstructive pulmonary disease)   . Pneumonia   . Depression     takes Zoloft    Past Surgical History  Procedure Laterality Date  . Insert / replace / remove pacemaker  01/17/2009    [employer] Cardiac Pacemaker   . Cardiac catheterization  1998  . Cholecystectomy    . 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 Esophagogastroduodenoscopy (egd) with esophageal dilation  03/22/2012    Procedure: ESOPHAGOGASTRODUODENOSCOPY (EGD) WITH ESOPHAGEAL DILATION;  Surgeon: Rogene Houston, MD;  Location: [facility];  Service: Endoscopy;  Laterality: N/A;  730  . Colonoscopy N/A 12/29/2012    Procedure: COLONOSCOPY;  Surgeon: Rogene Houston, MD;  Location: [facility];  Service: Endoscopy;  Laterality: N/A;  130  . Esophagogastroduodenoscopy (egd) with esophageal dilation N/A 02/12/2013    Procedure:  ESOPHAGOGASTRODUODENOSCOPY (EGD) WITH ESOPHAGEAL DILATION;  Surgeon: Rogene Houston, MD;  Location: [facility];  Service: Endoscopy;  Laterality: N/A;  . Breast surgery      lumpectomy - noncancerous  . Eye surgery Bilateral     cataract surgery   . Clavicle surgery Left   . Vaginal hysterectomy      Allergies  Allergen Reactions  . Penicillins Rash    History  Substance Use Topics  . Smoking status: Former Smoker -- 1.00 packs/day for 40 years    Quit date: 08/25/1971  . Smokeless tobacco: Never Used  . Alcohol Use: No    No family history on file. Prior to Admission medications   Medication Sig Start Date End Date Taking? Authorizing Provider  acetaminophen (TYLENOL) 500 MG tablet Take 1,000 mg by mouth every 6 (six) hours as needed. Pain   Yes Historical Provider, MD  Cholecalciferol (VITAMIN D) 400 UNITS capsule Take 400 Units by mouth daily.     Yes Historical Provider, MD  cyanocobalamin (,VITAMIN B-12,) 1000 MCG/ML injection Inject 1 mL (1,000 mcg total) into the muscle every 30 (thirty) days. 04/19/13  Yes Kathyrn Drown, MD  docusate sodium (COLACE) 100 MG capsule Take 100 mg by mouth daily as needed for mild constipation.    Yes Historical Provider, MD  feeding supplement, ENSURE COMPLETE, (ENSURE COMPLETE) LIQD Take 237 mLs by mouth 3 (three) times daily with meals. 03/25/13  Yes Rexene Alberts, MD  fludrocortisone (FLORINEF) 0.1  MG tablet Take 0.1 mg by mouth daily.   Yes Historical Provider, MD  levothyroxine (SYNTHROID, LEVOTHROID) 75 MCG tablet Take 1 tablet (75 mcg total) by mouth daily before breakfast. 04/21/13  Yes Kathyrn Drown, MD  loperamide (IMODIUM A-D) 2 MG tablet Take 2 mg by mouth daily as needed for diarrhea or loose stools.   Yes Historical Provider, MD  megestrol (MEGACE) 20 MG tablet Take 20 mg by mouth daily.   Yes Historical Provider, MD  midodrine (PROAMATINE) 5 MG tablet Take 1 tablet (5 mg total) by mouth 3 (three) times daily. 03/25/13  Yes Rexene Alberts, MD  omeprazole (PRILOSEC) 20 MG capsule Take 1 capsule (20 mg total) by mouth daily. 04/26/13  Yes Kathyrn Drown, MD  oxyCODONE-acetaminophen (PERCOCET/ROXICET) 5-325 MG per tablet Take 1 tablet by mouth 4 (four) times daily as needed for severe pain. 05/24/13  Yes Kathyrn Drown, MD  predniSONE (DELTASONE) 10 MG tablet Take 1 tablet (10 mg total) by mouth daily. For treatment of adrenal insufficiency. 03/25/13  Yes Rexene Alberts, MD  sertraline (ZOLOFT) 25 MG tablet Take 1 tablet (25 mg total) by mouth daily. 04/01/13  Yes Kathyrn Drown, MD     Review of Systems  Positive ROS: As above  All other systems have been reviewed and were otherwise negative with the exception of those mentioned in the HPI and as above.  Objective: Vital signs in last 24 hours: Temp:  [97.8 F (36.6 C)] 97.8 F (36.6 C) (04/30 1045) Pulse Rate:  [62] 62 (04/30 1045) Resp:  [18] 18 (04/30 1045) BP: (189)/(63) 189/63 mmHg (04/30 1045) SpO2:  [99 %] 99 % (04/30 1045) Weight:  [40.37 kg (89 lb)] 40.37 kg (89 lb) (04/30 1045)  General Appearance: Alert, cooperative, no distress, Head: Normocephalic, without obvious abnormality, atraumatic Eyes: PERRL, conjunctiva/corneas clear, EOM's intact,    Ears: Normal  Throat: Normal  Neck: Supple, symmetrical, trachea midline, no adenopathy; thyroid: No enlargement/tenderness/nodules; no carotid bruit or JVD Back: Symmetric, no curvature, ROM normal, no CVA tenderness Lungs: Clear to auscultation bilaterally, respirations unlabored Heart: Regular rate and rhythm, no murmur, rub or gallop Abdomen: Soft, non-tender,, no masses, no organomegaly Extremities: Extremities normal, atraumatic, no cyanosis or edema Pulses: 2+ and symmetric all extremities Skin: Skin color, texture, turgor normal, no rashes or lesions  NEUROLOGIC:   Mental status: alert and oriented, no aphasia, good attention span, Fund of knowledge/ memory ok Motor Exam - grossly normal Sensory Exam  - grossly normal Reflexes:  Coordination - grossly normal Gait - grossly normal Balance - grossly normal Cranial Nerves: I: smell Not tested  II: visual acuity  OS: Normal  OD: Normal   II: visual fields Full to confrontation  II: pupils Equal, round, reactive to light  III,VII: ptosis None  III,IV,VI: extraocular muscles  Full ROM  V: mastication Normal  V: facial light touch sensation  Normal  V,VII: corneal reflex  Present  VII: facial muscle function - upper  Normal  VII: facial muscle function - lower Normal  VIII: hearing Not tested  IX: soft palate elevation  Normal  IX,X: gag reflex Present  XI: trapezius strength  5/5  XI: sternocleidomastoid strength 5/5  XI: neck flexion strength  5/5  XII: tongue strength  Normal    Data Review Lab Results  Component Value Date   WBC 10.6* 06/06/2013   HGB 11.7* 06/06/2013   HCT 36.7 06/06/2013   MCV 98.4 06/06/2013   PLT 279 06/06/2013  Lab Results  Component Value Date   NA 144 06/06/2013   K 4.6 06/06/2013   CL 107 06/06/2013   CO2 22 06/06/2013   BUN 24* 06/06/2013   CREATININE 0.89 06/06/2013   GLUCOSE 131* 06/06/2013   Lab Results  Component Value Date   INR 0.94 01/18/2009    Assessment/Plan: T9 compression fracture, thoracic pain: I discussed the situation with the patient. I have reviewed her imaging studies with her. We have discussed the various treatment options including a T9 kyphoplasty. I have described the surgery to her. I have shown her surgical models. We have discussed the risks, benefits, alternatives, and likelihood of achieving our goals with surgery. I have answered all the patient's questions. She has decided to proceed with surgery.   Ophelia Charter 06/16/2013 11:51 AM

## 2013-06-16 NOTE — Anesthesia Preprocedure Evaluation (Addendum)
Anesthesia Evaluation  Patient identified by MRN, date of birth, ID band Patient awake    Reviewed: Allergy & Precautions, H&P , NPO status , Patient's Chart, lab work & pertinent test results  History of Anesthesia Complications Negative for: history of anesthetic complications  Airway Mallampati: I TM Distance: <3 FB Neck ROM: Full    Dental  (+) Dental Advisory Given, Teeth Intact   Pulmonary former smoker (quit 1973),  breath sounds clear to auscultation        Cardiovascular - angina+ dysrhythmias + pacemaker ([employer] 2010) Rhythm:Regular Rate:Normal  2/15 ECHO: EF 65-70% with grade 1 diastolic dysfunction, mild AI H/o orthostatic hypotension   Neuro/Psych Back pain    GI/Hepatic Neg liver ROS, GERD-  Medicated and Controlled,  Endo/Other  Hypothyroidism Hypocortisol: on steroids daily  Renal/GU H/o dehydration with subsequent renal insufficiency     Musculoskeletal   Abdominal   Peds  Hematology  (+) Blood dyscrasia (Hb 11.7), anemia ,   Anesthesia Other Findings   Reproductive/Obstetrics                       Anesthesia Physical Anesthesia Plan  ASA: III  Anesthesia Plan: General   Post-op Pain Management:    Induction: Intravenous  Airway Management Planned: Oral ETT  Additional Equipment: None  Intra-op Plan:   Post-operative Plan: Extubation in OR  Informed Consent: I have reviewed the patients History and Physical, chart, labs and discussed the procedure including the risks, benefits and alternatives for the proposed anesthesia with the patient or authorized representative who has indicated his/her understanding and acceptance.   Dental advisory given  Plan Discussed with: CRNA, Anesthesiologist and Surgeon  Anesthesia Plan Comments: (Plan routine monitors, GETA)       Anesthesia Quick Evaluation

## 2013-06-16 NOTE — Progress Notes (Signed)
Patient ID: Kaitlyn Cooper, female   DOB: 19-Dec-1926, 80 y.o.   MRN: [id number] Subjective:  The patient is alert and pleasant. She looks well. She is in no apparent distress.  Objective: Vital signs in last 24 hours: Temp:  [97.8 F (36.6 C)] 97.8 F (36.6 C) (04/30 1430) Pulse Rate:  [62-84] 79 (04/30 1638) Resp:  [15-19] 16 (04/30 1638) BP: (98-189)/(51-71) 98/58 mmHg (04/30 1638) SpO2:  [94 %-100 %] 94 % (04/30 1638) Weight:  [40.37 kg (89 lb)] 40.37 kg (89 lb) (04/30 1045)  Intake/Output from previous day:   Intake/Output this shift: Total I/O In: 900 [P.O.:300; I.V.:600] Out: -   Physical exam patient is alert and oriented. She is moving her lower extremities well.  Lab Results: No results found for this basename: WBC, HGB, HCT, PLT,  in the last 72 hours BMET No results found for this basename: NA, K, CL, CO2, GLUCOSE, BUN, CREATININE, CALCIUM,  in the last 72 hours  Studies/Results: Dg Thoracic Spine 2 View  06/16/2013   CLINICAL DATA:  Thoracic compression fracture.  FLUOROSCOPY TIME:  2 min 30 seconds  EXAM: THORACIC SPINE - 2 VIEW; DG C-ARM 1-60 MIN  COMPARISON:  Radiographs dated 05/02/2013  FINDINGS: AP and lateral C-arm images demonstrate the patient has undergone kyphoplasty at T9.  IMPRESSION: Kyphoplasty performed at T9.   Electronically Signed   By: Rozetta Nunnery M.D.   On: 06/16/2013 14:22   Dg C-arm 1-60 Min  06/16/2013   CLINICAL DATA:  Thoracic compression fracture.  FLUOROSCOPY TIME:  2 min 30 seconds  EXAM: THORACIC SPINE - 2 VIEW; DG C-ARM 1-60 MIN  COMPARISON:  Radiographs dated 05/02/2013  FINDINGS: AP and lateral C-arm images demonstrate the patient has undergone kyphoplasty at T9.  IMPRESSION: Kyphoplasty performed at T9.   Electronically Signed   By: Rozetta Nunnery M.D.   On: 06/16/2013 14:22    Assessment/Plan: The patient is doing well and we'll likely go home tomorrow.  LOS: 0 days     Ophelia Charter 06/16/2013, 6:32 PM

## 2013-06-16 NOTE — Anesthesia Postprocedure Evaluation (Signed)
  Anesthesia Post-op Note  Patient: Kaitlyn Cooper  Procedure(s) Performed: Procedure(s) with comments: Thoracic Nine Kyphoplasty (N/A) - Thoracic Nine Kyphoplasty  Patient Location: PACU  Anesthesia Type:General  Level of Consciousness: awake, alert , oriented and patient cooperative  Airway and Oxygen Therapy: Patient Spontanous Breathing and Patient connected to nasal cannula oxygen  Post-op Pain: none  Post-op Assessment: Post-op Vital signs reviewed, Patient's Cardiovascular Status Stable, Respiratory Function Stable, Patent Airway, No signs of Nausea or vomiting and Pain level controlled  Post-op Vital Signs: Reviewed and stable  Last Vitals:  Filed Vitals:   06/16/13 1345  BP: 164/51  Pulse: 72  Temp:   Resp: 17    Complications: No apparent anesthesia complications

## 2013-06-16 NOTE — Progress Notes (Signed)
Upon initial assessment, noticed bruise to left cheek, and bilateral forearms. Pt states they are new. Dr. Glennon Mac aware. Gauze wrap applied to left forearm. Pt skin appears very thin and look as if it will tear easy. No further orders. Will cont to monitor.

## 2013-06-16 NOTE — Progress Notes (Signed)
Spoke with Windle Guard with [employer]. He will send Christina with [employer] to Neuro holding area at 11:00.  If she is needed before then call [phone].

## 2013-06-17 ENCOUNTER — Encounter (HOSPITAL_COMMUNITY): Payer: Self-pay | Admitting: Neurosurgery

## 2013-06-17 MED ORDER — OXYCODONE-ACETAMINOPHEN 5-325 MG PO TABS: 1.0000 | ORAL_TABLET | ORAL | Status: DC | PRN

## 2013-06-17 NOTE — Evaluation (Signed)
Physical Therapy Evaluation Patient Details Name: Kaitlyn Cooper MRN: [id number] DOB: 1926-11-05 Today's Date: 06/17/2013   History of Present Illness  Pt s/p T9 kyphoplasty after fall 12/24 with compression fx and failed conservative tx  Clinical Impression  Pt very pleasant but has difficulty recalling precautions and maintaining during mobility without cues. Pt will benefit from acute therapy to maximize function, transfers and gait adhering to precautions prior to DC to maximize independence and address deficits.    Follow Up Recommendations No PT follow up    Equipment Recommendations  None recommended by PT    Recommendations for Other Services       Precautions / Restrictions Precautions Precautions: Fall;Back Precaution Booklet Issued: Yes (comment)      Mobility  Bed Mobility Overal bed mobility: Needs Assistance Bed Mobility: Rolling;Sidelying to Sit Rolling: Supervision Sidelying to sit: Supervision       General bed mobility comments: cues for sequence and precautions  Transfers Overall transfer level: Modified independent                  Ambulation/Gait Ambulation/Gait assistance: Modified independent (Device/Increase time) Ambulation Distance (Feet): 300 Feet Assistive device: None Gait Pattern/deviations: Step-through pattern;Decreased stride length;Trunk flexed   Gait velocity interpretation: Below normal speed for age/gender    Stairs Stairs: Yes Stairs assistance: Min assist Stair Management: Step to pattern;Forwards Number of Stairs: 5 General stair comments: one hand on wall and one hand held assist, pt states she is having stairs fixed and rail put in but she doesn't know when. Sister assists with stairs at home  Wheelchair Mobility    Modified Rankin (Stroke Patients Only)       Balance                                             Pertinent Vitals/Pain No pain    Home Living Family/patient expects  to be discharged to:: Private residence Living Arrangements: Other relatives Available Help at Discharge: Family;Available 24 hours/day Type of Home: House Home Access: Stairs to enter Entrance Stairs-Rails: None Entrance Stairs-Number of Steps: 5 Home Layout: One level Home Equipment: Walker - 2 wheels;Wheelchair - manual Additional Comments: Pt's younger sister came to live with her after fall.  Sister does the cooking    Prior Function Level of Independence: Needs assistance   Gait / Transfers Assistance Needed: independent  ADL's / Homemaking Assistance Needed: sister assists        Hand Dominance        Extremity/Trunk Assessment   Upper Extremity Assessment: Generalized weakness           Lower Extremity Assessment: Generalized weakness      Cervical / Trunk Assessment: Kyphotic  Communication   Communication: No difficulties  Cognition Arousal/Alertness: Awake/alert Behavior During Therapy: WFL for tasks assessed/performed Overall Cognitive Status: Within Functional Limits for tasks assessed       Memory: Decreased recall of precautions              General Comments      Exercises        Assessment/Plan    PT Assessment Patient needs continued PT services  PT Diagnosis Generalized weakness   PT Problem List Decreased strength;Decreased activity tolerance;Decreased knowledge of precautions  PT Treatment Interventions Gait training;Functional mobility training;Therapeutic activities;Patient/family education   PT Goals (Current goals can be found in the  Care Plan section) Acute Rehab PT Goals Patient Stated Goal: return home PT Goal Formulation: With patient Time For Goal Achievement: 06/24/13 Potential to Achieve Goals: Good    Frequency Min 5X/week   Barriers to discharge        Co-evaluation               End of Session   Activity Tolerance: Patient tolerated treatment well Patient left: in chair;with call bell/phone  within reach Nurse Communication: Mobility status;Precautions    Functional Assessment Tool Used: clinical judgement Functional Limitation: Mobility: Walking and moving around Mobility: Walking and Moving Around Current Status (X4481): At least 1 percent but less than 20 percent impaired, limited or restricted Mobility: Walking and Moving Around Goal Status [phone]): At least 1 percent but less than 20 percent impaired, limited or restricted    Time: 0753-0810 PT Time Calculation (min): 17 min   Charges:   PT Evaluation $Initial PT Evaluation Tier I: 1 Procedure PT Treatments $Therapeutic Activity: 8-22 mins   PT G Codes:   Functional Assessment Tool Used: clinical judgement Functional Limitation: Mobility: Walking and moving around    [employer] 06/17/2013, 9:05 AM Elwyn Reach, [place]

## 2013-06-17 NOTE — Progress Notes (Signed)
Pt. Alert and oriented,follows simple instructions, denies severe pain. Incision area without swelling, redness or S/S of infection. Voiding adequate clear yellow urine. Moving all extremities well and vitals stable and documented. Lumbar surgery notes instructions given to patient and family member for home safety and precautions. Pt. and family stated understanding of instructions given

## 2013-06-17 NOTE — Discharge Summary (Signed)
Physician Discharge Summary  Patient ID: Kaitlyn Cooper MRN: [id number] DOB/AGE: 78-17-1928 78 y.o.  Admit date: 06/16/2013 Discharge date: 06/17/2013  Admission Diagnoses: T9 compression fracture, thoracic back pain.  Discharge Diagnoses: The same Active Problems:   Thoracic compression fracture   Discharged Condition: good  Hospital Course: I performed at T9 kyphoplasty on the patient L4 [id number]. The surgery went well.  The patient's postoperative course was unremarkable. On postop day #1 she requested discharge to home. She was given oral and written discharge instructions. All her questions were answered.  Consults: None Significant Diagnostic Studies: None Treatments: T9 kyphoplasty Discharge Exam: Blood pressure 172/72, pulse 64, temperature 97.3 F (36.3 C), temperature source Oral, resp. rate 17, height 5\' 3"  (1.6 m), weight 40.37 kg (89 lb), SpO2 96.00%. Patient is alert and pleasant. Her strength is normal. Her dressing is clean and dry.  Disposition: Home  Discharge Orders   Future Orders Complete By Expires   Call MD for:  difficulty breathing, headache or visual disturbances  As directed    Call MD for:  extreme fatigue  As directed    Call MD for:  hives  As directed    Call MD for:  persistant dizziness or light-headedness  As directed    Call MD for:  persistant nausea and vomiting  As directed    Call MD for:  redness, tenderness, or signs of infection (pain, swelling, redness, odor or green/yellow discharge around incision site)  As directed    Call MD for:  severe uncontrolled pain  As directed    Call MD for:  temperature >100.4  As directed    Diet - low sodium heart healthy  As directed    Discharge instructions  As directed    Driving Restrictions  As directed    Increase activity slowly  As directed    Lifting restrictions  As directed    May shower / Bathe  As directed    Remove dressing in 48 hours  As directed        Medication List         acetaminophen 500 MG tablet  Commonly known as:  TYLENOL  Take 1,000 mg by mouth every 6 (six) hours as needed. Pain     cyanocobalamin 1000 MCG/ML injection  Commonly known as:  (VITAMIN B-12)  Inject 1 mL (1,000 mcg total) into the muscle every 30 (thirty) days.     docusate sodium 100 MG capsule  Commonly known as:  COLACE  Take 100 mg by mouth daily as needed for mild constipation.     feeding supplement (ENSURE COMPLETE) Liqd  Take 237 mLs by mouth 3 (three) times daily with meals.     fludrocortisone 0.1 MG tablet  Commonly known as:  FLORINEF  Take 0.1 mg by mouth daily.     IMODIUM A-D 2 MG tablet  Generic drug:  loperamide  Take 2 mg by mouth daily as needed for diarrhea or loose stools.     levothyroxine 75 MCG tablet  Commonly known as:  SYNTHROID, LEVOTHROID  Take 1 tablet (75 mcg total) by mouth daily before breakfast.     megestrol 20 MG tablet  Commonly known as:  MEGACE  Take 20 mg by mouth daily.     midodrine 5 MG tablet  Commonly known as:  PROAMATINE  Take 1 tablet (5 mg total) by mouth 3 (three) times daily.     omeprazole 20 MG capsule  Commonly known as:  PRILOSEC  Take 1 capsule (20 mg total) by mouth daily.     oxyCODONE-acetaminophen 5-325 MG per tablet  Commonly known as:  PERCOCET/ROXICET  Take 1 tablet by mouth 4 (four) times daily as needed for severe pain.     oxyCODONE-acetaminophen 5-325 MG per tablet  Commonly known as:  PERCOCET/ROXICET  Take 1-2 tablets by mouth every 4 (four) hours as needed for moderate pain.     predniSONE 10 MG tablet  Commonly known as:  DELTASONE  Take 1 tablet (10 mg total) by mouth daily. For treatment of adrenal insufficiency.     sertraline 25 MG tablet  Commonly known as:  ZOLOFT  Take 1 tablet (25 mg total) by mouth daily.     Vitamin D 400 UNITS capsule  Take 400 Units by mouth daily.         Signed: Ophelia Charter 06/17/2013, 7:28 AM

## 2013-06-19 ENCOUNTER — Emergency Department (HOSPITAL_COMMUNITY): Payer: Medicare Other

## 2013-06-19 ENCOUNTER — Encounter (HOSPITAL_COMMUNITY): Payer: Self-pay | Admitting: Emergency Medicine

## 2013-06-19 ENCOUNTER — Inpatient Hospital Stay (HOSPITAL_COMMUNITY)
Admission: EM | Admit: 2013-06-19 | Discharge: 2013-06-21 | DRG: 372 | Disposition: A | Discharge status: Home / Self Care | Payer: Medicare Other | Attending: Internal Medicine | Admitting: Internal Medicine

## 2013-06-19 DIAGNOSIS — F3289 Other specified depressive episodes: Secondary | ICD-10-CM

## 2013-06-19 DIAGNOSIS — E876 Hypokalemia: Secondary | ICD-10-CM

## 2013-06-19 DIAGNOSIS — IMO0002 Reserved for concepts with insufficient information to code with codable children: Secondary | ICD-10-CM

## 2013-06-19 DIAGNOSIS — D72829 Elevated white blood cell count, unspecified: Secondary | ICD-10-CM

## 2013-06-19 DIAGNOSIS — Z860100 Personal history of colon polyps, unspecified: Secondary | ICD-10-CM

## 2013-06-19 DIAGNOSIS — A0472 Enterocolitis due to Clostridium difficile, not specified as recurrent: Principal | ICD-10-CM

## 2013-06-19 DIAGNOSIS — H8110 Benign paroxysmal vertigo, unspecified ear: Secondary | ICD-10-CM

## 2013-06-19 DIAGNOSIS — I498 Other specified cardiac arrhythmias: Secondary | ICD-10-CM

## 2013-06-19 DIAGNOSIS — D509 Iron deficiency anemia, unspecified: Secondary | ICD-10-CM

## 2013-06-19 DIAGNOSIS — E2740 Unspecified adrenocortical insufficiency: Secondary | ICD-10-CM

## 2013-06-19 DIAGNOSIS — R197 Diarrhea, unspecified: Secondary | ICD-10-CM

## 2013-06-19 DIAGNOSIS — Z79899 Other long term (current) drug therapy: Secondary | ICD-10-CM

## 2013-06-19 DIAGNOSIS — Z800 Family history of malignant neoplasm of digestive organs: Secondary | ICD-10-CM

## 2013-06-19 DIAGNOSIS — K219 Gastro-esophageal reflux disease without esophagitis: Secondary | ICD-10-CM

## 2013-06-19 DIAGNOSIS — I951 Orthostatic hypotension: Secondary | ICD-10-CM

## 2013-06-19 DIAGNOSIS — K5790 Diverticulosis of intestine, part unspecified, without perforation or abscess without bleeding: Secondary | ICD-10-CM | POA: Insufficient documentation

## 2013-06-19 DIAGNOSIS — S22000A Wedge compression fracture of unspecified thoracic vertebra, initial encounter for closed fracture: Secondary | ICD-10-CM

## 2013-06-19 DIAGNOSIS — M949 Disorder of cartilage, unspecified: Secondary | ICD-10-CM

## 2013-06-19 DIAGNOSIS — Z87891 Personal history of nicotine dependence: Secondary | ICD-10-CM

## 2013-06-19 DIAGNOSIS — J4489 Other specified chronic obstructive pulmonary disease: Secondary | ICD-10-CM | POA: Diagnosis present

## 2013-06-19 DIAGNOSIS — K5730 Diverticulosis of large intestine without perforation or abscess without bleeding: Secondary | ICD-10-CM

## 2013-06-19 DIAGNOSIS — S22070A Wedge compression fracture of T9-T10 vertebra, initial encounter for closed fracture: Secondary | ICD-10-CM

## 2013-06-19 DIAGNOSIS — D126 Benign neoplasm of colon, unspecified: Secondary | ICD-10-CM

## 2013-06-19 DIAGNOSIS — J449 Chronic obstructive pulmonary disease, unspecified: Secondary | ICD-10-CM | POA: Diagnosis present

## 2013-06-19 DIAGNOSIS — E43 Unspecified severe protein-calorie malnutrition: Secondary | ICD-10-CM

## 2013-06-19 DIAGNOSIS — M899 Disorder of bone, unspecified: Secondary | ICD-10-CM

## 2013-06-19 DIAGNOSIS — R1310 Dysphagia, unspecified: Secondary | ICD-10-CM

## 2013-06-19 DIAGNOSIS — D510 Vitamin B12 deficiency anemia due to intrinsic factor deficiency: Secondary | ICD-10-CM

## 2013-06-19 DIAGNOSIS — M810 Age-related osteoporosis without current pathological fracture: Secondary | ICD-10-CM

## 2013-06-19 DIAGNOSIS — E2749 Other adrenocortical insufficiency: Secondary | ICD-10-CM

## 2013-06-19 DIAGNOSIS — E860 Dehydration: Secondary | ICD-10-CM

## 2013-06-19 DIAGNOSIS — R109 Unspecified abdominal pain: Secondary | ICD-10-CM

## 2013-06-19 DIAGNOSIS — E785 Hyperlipidemia, unspecified: Secondary | ICD-10-CM

## 2013-06-19 DIAGNOSIS — K222 Esophageal obstruction: Secondary | ICD-10-CM

## 2013-06-19 DIAGNOSIS — R509 Fever, unspecified: Secondary | ICD-10-CM

## 2013-06-19 DIAGNOSIS — G8929 Other chronic pain: Secondary | ICD-10-CM | POA: Diagnosis present

## 2013-06-19 DIAGNOSIS — K529 Noninfective gastroenteritis and colitis, unspecified: Secondary | ICD-10-CM

## 2013-06-19 DIAGNOSIS — R531 Weakness: Secondary | ICD-10-CM

## 2013-06-19 DIAGNOSIS — K5792 Diverticulitis of intestine, part unspecified, without perforation or abscess without bleeding: Secondary | ICD-10-CM

## 2013-06-19 DIAGNOSIS — E875 Hyperkalemia: Secondary | ICD-10-CM

## 2013-06-19 DIAGNOSIS — Z950 Presence of cardiac pacemaker: Secondary | ICD-10-CM

## 2013-06-19 DIAGNOSIS — F329 Major depressive disorder, single episode, unspecified: Secondary | ICD-10-CM

## 2013-06-19 DIAGNOSIS — R634 Abnormal weight loss: Secondary | ICD-10-CM

## 2013-06-19 DIAGNOSIS — R1319 Other dysphagia: Secondary | ICD-10-CM

## 2013-06-19 DIAGNOSIS — I2510 Atherosclerotic heart disease of native coronary artery without angina pectoris: Secondary | ICD-10-CM

## 2013-06-19 DIAGNOSIS — E039 Hypothyroidism, unspecified: Secondary | ICD-10-CM

## 2013-06-19 DIAGNOSIS — N179 Acute kidney failure, unspecified: Secondary | ICD-10-CM

## 2013-06-19 HISTORY — DX: Enterocolitis due to Clostridium difficile, not specified as recurrent: A04.72

## 2013-06-19 HISTORY — DX: Hyperlipidemia, unspecified: E78.5

## 2013-06-19 LAB — CBC WITH DIFFERENTIAL/PLATELET
Basophils Absolute: 0 10*3/uL (ref 0.0–0.1)
Basophils Relative: 0 % (ref 0–1)
Eosinophils Absolute: 0 10*3/uL (ref 0.0–0.7)
Eosinophils Relative: 0 % (ref 0–5)
HCT: 38 % (ref 36.0–46.0)
Hemoglobin: 12.6 g/dL (ref 12.0–15.0)
Lymphocytes Relative: 10 % — ABNORMAL LOW (ref 12–46)
Lymphs Abs: 1.8 10*3/uL (ref 0.7–4.0)
MCH: 32 pg (ref 26.0–34.0)
MCHC: 33.2 g/dL (ref 30.0–36.0)
MCV: 96.4 fL (ref 78.0–100.0)
Monocytes Absolute: 1.7 10*3/uL — ABNORMAL HIGH (ref 0.1–1.0)
Monocytes Relative: 9 % (ref 3–12)
Neutro Abs: 15.1 10*3/uL — ABNORMAL HIGH (ref 1.7–7.7)
Neutrophils Relative %: 81 % — ABNORMAL HIGH (ref 43–77)
Platelets: 311 10*3/uL (ref 150–400)
RBC: 3.94 MIL/uL (ref 3.87–5.11)
RDW: 15 % (ref 11.5–15.5)
WBC: 18.7 10*3/uL — ABNORMAL HIGH (ref 4.0–10.5)

## 2013-06-19 LAB — URINALYSIS, ROUTINE W REFLEX MICROSCOPIC
Bilirubin Urine: NEGATIVE
Glucose, UA: NEGATIVE mg/dL
Ketones, ur: NEGATIVE mg/dL
Leukocytes, UA: NEGATIVE
Nitrite: NEGATIVE
Specific Gravity, Urine: 1.025 (ref 1.005–1.030)
Urobilinogen, UA: 0.2 mg/dL (ref 0.0–1.0)
pH: 6 (ref 5.0–8.0)

## 2013-06-19 LAB — BASIC METABOLIC PANEL
BUN: 21 mg/dL (ref 6–23)
CALCIUM: 8.9 mg/dL (ref 8.4–10.5)
CO2: 25 meq/L (ref 19–32)
CREATININE: 0.85 mg/dL (ref 0.50–1.10)
Chloride: 101 mEq/L (ref 96–112)
GFR calc Af Amer: 71 mL/min — ABNORMAL LOW (ref >90)
GFR calc non Af Amer: 61 mL/min — ABNORMAL LOW (ref >90)
GLUCOSE: 130 mg/dL — AB (ref 70–99)
Potassium: 3 mEq/L — ABNORMAL LOW (ref 3.7–5.3)
Sodium: 140 mEq/L (ref 137–147)

## 2013-06-19 LAB — HEPATIC FUNCTION PANEL
ALT: 16 U/L (ref 0–35)
AST: 22 U/L (ref 0–37)
Albumin: 3.2 g/dL — ABNORMAL LOW (ref 3.5–5.2)
Alkaline Phosphatase: 44 U/L (ref 39–117)
Total Bilirubin: 0.5 mg/dL (ref 0.3–1.2)
Total Protein: 7 g/dL (ref 6.0–8.3)

## 2013-06-19 LAB — URINE MICROSCOPIC-ADD ON

## 2013-06-19 LAB — LIPASE, BLOOD: Lipase: 63 U/L — ABNORMAL HIGH (ref 11–59)

## 2013-06-19 MED ORDER — ADULT MULTIVITAMIN W/MINERALS CH
1.0000 | ORAL_TABLET | Freq: Every day | ORAL | Status: DC
  Administered 2013-06-20 – 2013-06-21 (×2): 1 via ORAL
  Filled 2013-06-19 (×2): qty 1

## 2013-06-19 MED ORDER — LEVOTHYROXINE SODIUM 75 MCG PO TABS
75.0000 ug | ORAL_TABLET | Freq: Every day | ORAL | Status: DC
  Administered 2013-06-20 – 2013-06-21 (×2): 75 ug via ORAL
  Filled 2013-06-19 (×2): qty 1

## 2013-06-19 MED ORDER — VITAMIN D 400 UNITS PO CAPS: 400.0000 [IU] | ORAL_CAPSULE | Freq: Every day | ORAL | Status: DC

## 2013-06-19 MED ORDER — ONDANSETRON HCL 4 MG PO TABS: 4.0000 mg | ORAL_TABLET | Freq: Four times a day (QID) | ORAL | Status: DC | PRN

## 2013-06-19 MED ORDER — ONDANSETRON HCL 4 MG/2ML IJ SOLN: 4.0000 mg | Freq: Four times a day (QID) | INTRAMUSCULAR | Status: DC | PRN

## 2013-06-19 MED ORDER — MAGNESIUM CITRATE PO SOLN: 1.0000 | Freq: Once | ORAL | Status: AC | PRN

## 2013-06-19 MED ORDER — ALUM & MAG HYDROXIDE-SIMETH 200-200-20 MG/5ML PO SUSP: 30.0000 mL | Freq: Four times a day (QID) | ORAL | Status: DC | PRN

## 2013-06-19 MED ORDER — SODIUM CHLORIDE 0.9 % IV SOLN
INTRAVENOUS | Status: DC
  Administered 2013-06-19: 19:00:00 via INTRAVENOUS

## 2013-06-19 MED ORDER — ONDANSETRON HCL 4 MG/2ML IJ SOLN
2.0000 mg | Freq: Once | INTRAMUSCULAR | Status: AC
  Administered 2013-06-19: 2 mg via INTRAVENOUS
  Filled 2013-06-19: qty 2

## 2013-06-19 MED ORDER — SERTRALINE HCL 50 MG PO TABS
25.0000 mg | ORAL_TABLET | Freq: Every day | ORAL | Status: DC
  Administered 2013-06-20 – 2013-06-21 (×2): 25 mg via ORAL
  Filled 2013-06-19 (×2): qty 1

## 2013-06-19 MED ORDER — CIPROFLOXACIN IN D5W 400 MG/200ML IV SOLN
400.0000 mg | Freq: Two times a day (BID) | INTRAVENOUS | Status: DC
  Administered 2013-06-19 – 2013-06-20 (×2): 400 mg via INTRAVENOUS
  Filled 2013-06-19 (×2): qty 200

## 2013-06-19 MED ORDER — ENSURE COMPLETE PO LIQD
237.0000 mL | Freq: Three times a day (TID) | ORAL | Status: DC
  Administered 2013-06-20 – 2013-06-21 (×3): 237 mL via ORAL

## 2013-06-19 MED ORDER — VITAMIN B-1 100 MG PO TABS
100.0000 mg | ORAL_TABLET | Freq: Every day | ORAL | Status: DC
  Administered 2013-06-20 – 2013-06-21 (×2): 100 mg via ORAL
  Filled 2013-06-19 (×2): qty 1

## 2013-06-19 MED ORDER — AMLODIPINE BESYLATE 5 MG PO TABS
5.0000 mg | ORAL_TABLET | Freq: Every day | ORAL | Status: DC
  Administered 2013-06-20: 5 mg via ORAL
  Filled 2013-06-19: qty 1

## 2013-06-19 MED ORDER — DOCUSATE SODIUM 100 MG PO CAPS
100.0000 mg | ORAL_CAPSULE | Freq: Two times a day (BID) | ORAL | Status: DC
  Administered 2013-06-19 – 2013-06-20 (×2): 100 mg via ORAL
  Filled 2013-06-19 (×2): qty 1

## 2013-06-19 MED ORDER — ACETAMINOPHEN 325 MG PO TABS
650.0000 mg | ORAL_TABLET | Freq: Four times a day (QID) | ORAL | Status: DC | PRN
  Administered 2013-06-19: 650 mg via ORAL
  Filled 2013-06-19: qty 2

## 2013-06-19 MED ORDER — SODIUM CHLORIDE 0.9 % IV SOLN: INTRAVENOUS | Status: AC

## 2013-06-19 MED ORDER — IOHEXOL 300 MG/ML  SOLN
90.0000 mL | Freq: Once | INTRAMUSCULAR | Status: AC | PRN
  Administered 2013-06-19: 90 mL via INTRAVENOUS

## 2013-06-19 MED ORDER — MIDODRINE HCL 5 MG PO TABS
5.0000 mg | ORAL_TABLET | Freq: Three times a day (TID) | ORAL | Status: DC
  Administered 2013-06-20: 5 mg via ORAL
  Filled 2013-06-19 (×8): qty 1

## 2013-06-19 MED ORDER — CHOLECALCIFEROL 10 MCG (400 UNIT) PO TABS
400.0000 [IU] | ORAL_TABLET | Freq: Every day | ORAL | Status: DC
  Administered 2013-06-20 – 2013-06-21 (×2): 400 [IU] via ORAL
  Filled 2013-06-19 (×2): qty 1

## 2013-06-19 MED ORDER — MEGESTROL ACETATE 40 MG PO TABS
20.0000 mg | ORAL_TABLET | Freq: Every day | ORAL | Status: DC
  Administered 2013-06-20 – 2013-06-21 (×2): 20 mg via ORAL
  Filled 2013-06-19: qty 0.5
  Filled 2013-06-19: qty 1
  Filled 2013-06-19: qty 0.5

## 2013-06-19 MED ORDER — PANTOPRAZOLE SODIUM 40 MG PO TBEC
40.0000 mg | DELAYED_RELEASE_TABLET | Freq: Every day | ORAL | Status: DC
  Administered 2013-06-20 – 2013-06-21 (×2): 40 mg via ORAL
  Filled 2013-06-19 (×2): qty 1

## 2013-06-19 MED ORDER — ACETAMINOPHEN 650 MG RE SUPP: 650.0000 mg | Freq: Four times a day (QID) | RECTAL | Status: DC | PRN

## 2013-06-19 MED ORDER — PREDNISONE 10 MG PO TABS
10.0000 mg | ORAL_TABLET | Freq: Every day | ORAL | Status: DC
  Administered 2013-06-20 – 2013-06-21 (×2): 10 mg via ORAL
  Filled 2013-06-19 (×3): qty 1

## 2013-06-19 MED ORDER — ASPIRIN EC 81 MG PO TBEC
81.0000 mg | DELAYED_RELEASE_TABLET | Freq: Every day | ORAL | Status: DC
  Administered 2013-06-20 – 2013-06-21 (×2): 81 mg via ORAL
  Filled 2013-06-19 (×2): qty 1

## 2013-06-19 MED ORDER — FOLIC ACID 1 MG PO TABS
1.0000 mg | ORAL_TABLET | Freq: Every day | ORAL | Status: DC
  Administered 2013-06-20 – 2013-06-21 (×2): 1 mg via ORAL
  Filled 2013-06-19 (×2): qty 1

## 2013-06-19 MED ORDER — POLYETHYLENE GLYCOL 3350 17 G PO PACK: 17.0000 g | PACK | Freq: Every day | ORAL | Status: DC | PRN

## 2013-06-19 MED ORDER — SODIUM CHLORIDE 0.9 % IV BOLUS (SEPSIS)
500.0000 mL | Freq: Once | INTRAVENOUS | Status: AC
  Administered 2013-06-19: 500 mL via INTRAVENOUS

## 2013-06-19 MED ORDER — IOHEXOL 300 MG/ML  SOLN
50.0000 mL | Freq: Once | INTRAMUSCULAR | Status: AC | PRN
  Administered 2013-06-19: 50 mL via ORAL

## 2013-06-19 MED ORDER — HYDROCODONE-ACETAMINOPHEN 5-325 MG PO TABS: 1.0000 | ORAL_TABLET | ORAL | Status: DC | PRN

## 2013-06-19 MED ORDER — HEPARIN SODIUM (PORCINE) 5000 UNIT/ML IJ SOLN
5000.0000 [IU] | Freq: Three times a day (TID) | INTRAMUSCULAR | Status: DC
  Administered 2013-06-19 – 2013-06-21 (×5): 5000 [IU] via SUBCUTANEOUS
  Filled 2013-06-19 (×5): qty 1

## 2013-06-19 MED ORDER — FLUDROCORTISONE ACETATE 0.1 MG PO TABS
0.1000 mg | ORAL_TABLET | Freq: Every day | ORAL | Status: DC
  Administered 2013-06-20 – 2013-06-21 (×2): 0.1 mg via ORAL
  Filled 2013-06-19 (×3): qty 1

## 2013-06-19 MED ORDER — CYANOCOBALAMIN 1000 MCG/ML IJ SOLN
1000.0000 ug | INTRAMUSCULAR | Status: DC
  Administered 2013-06-21: 1000 ug via INTRAMUSCULAR
  Filled 2013-06-19: qty 1

## 2013-06-19 NOTE — ED Provider Notes (Signed)
CSN: [id number]     Arrival date & time 06/19/13  1112 History  This chart was scribed for Kaitlyn Christen, MD by Roxan Diesel, ED scribe.  This patient was seen in room APA15/[facility]15 and the patient's care was started at 1:26 PM.   Chief Complaint  Patient presents with  . Abdominal Pain    The history is provided by the patient. No language interpreter was used.    HPI Comments: Kaitlyn Cooper is a 80 y.o. female who presents to the Emergency Department complaining of moderate LLQ abdominal pain that began yesterday afternoon with associated diarrhea.  Pt states that her initial symptom was pain in her LLQ abdomen.  She subsequently developed diarrhea which began later yesterday and went on through the night.  She describes stool as containing "a little stringy like something."  Her last BM was earlier today.   She also reports she got up to urinate 8 times last night but denies dysuria.  She states she has had very little appetite and has not been eating much.  She denies CP.  Pt denies h/o diverticulitis.  She denies h/o abdominal surgery.  Daughter reports pt was at [facility] on 4/30 for surgery for a compression fracture.  She was discharged 2 days ago.     Past Medical History  Diagnosis Date  . Anemia     followed by Dr.Neijstrom  . Hyperlipidemia   . Hypothyroidism   . History of atherosclerotic cardiovascular disease   . History of colonoscopy     02/2002 showed few diverticula at sigmoid colon otherwise normal colonoscopy and terminal ileoscopy 1995 several colon polyps ,tubular adenoma  . GERD (gastroesophageal reflux disease)     chronic  . GERD (gastroesophageal reflux disease)     chronic gerd  . Tiredness   . T9 vertebral fracture 02/09/13    result of fall at home  . Adrenal insufficiency 03/23/2013  . Orthostatic hypotension 03/21/2013  . Pacemaker   . Heart murmur     "years ago"  . COPD (chronic obstructive pulmonary disease)   . Pneumonia   . Depression     takes  Zoloft    Past Surgical History  Procedure Laterality Date  . Insert / replace / remove pacemaker  01/17/2009    [employer] Cardiac Pacemaker   . Cardiac catheterization  1998  . Cholecystectomy    . 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 Esophagogastroduodenoscopy (egd) with esophageal dilation  03/22/2012    Procedure: ESOPHAGOGASTRODUODENOSCOPY (EGD) WITH ESOPHAGEAL DILATION;  Surgeon: Rogene Houston, MD;  Location: [facility];  Service: Endoscopy;  Laterality: N/A;  730  . Colonoscopy N/A 12/29/2012    Procedure: COLONOSCOPY;  Surgeon: Rogene Houston, MD;  Location: [facility];  Service: Endoscopy;  Laterality: N/A;  130  . Esophagogastroduodenoscopy (egd) with esophageal dilation N/A 02/12/2013    Procedure: ESOPHAGOGASTRODUODENOSCOPY (EGD) WITH ESOPHAGEAL DILATION;  Surgeon: Rogene Houston, MD;  Location: [facility];  Service: Endoscopy;  Laterality: N/A;  . Breast surgery      lumpectomy - noncancerous  . Eye surgery Bilateral     cataract surgery   . Clavicle surgery Left   . Vaginal hysterectomy    . Kyphoplasty N/A 06/16/2013    Procedure: Thoracic Nine Kyphoplasty;  Surgeon: Ophelia Charter, MD;  Location: [facility];  Service: Neurosurgery;  Laterality: N/A;  Thoracic Nine Kyphoplasty  . Back surgery  No family history on file.   History  Substance Use Topics  . Smoking status: Former Smoker -- 1.00 packs/day for 40 years    Quit date: 08/25/1971  . Smokeless tobacco: Never Used  . Alcohol Use: No    OB History   Grav Para Term Preterm Abortions TAB SAB Ect Mult Living                   Review of Systems A complete 10 system review of systems was obtained and all systems are negative except as noted in the HPI and PMH.     Allergies  Penicillins  Home Medications   Prior to Admission medications   Medication Sig Start Date End Date Taking? Authorizing Provider  acetaminophen (TYLENOL) 500 MG tablet Take  1,000 mg by mouth every 6 (six) hours as needed. Pain    Historical Provider, MD  Cholecalciferol (VITAMIN D) 400 UNITS capsule Take 400 Units by mouth daily.      Historical Provider, MD  cyanocobalamin (,VITAMIN B-12,) 1000 MCG/ML injection Inject 1 mL (1,000 mcg total) into the muscle every 30 (thirty) days. 04/19/13   Kathyrn Drown, MD  docusate sodium (COLACE) 100 MG capsule Take 100 mg by mouth daily as needed for mild constipation.     Historical Provider, MD  feeding supplement, ENSURE COMPLETE, (ENSURE COMPLETE) LIQD Take 237 mLs by mouth 3 (three) times daily with meals. 03/25/13   Rexene Alberts, MD  fludrocortisone (FLORINEF) 0.1 MG tablet Take 0.1 mg by mouth daily.    Historical Provider, MD  levothyroxine (SYNTHROID, LEVOTHROID) 75 MCG tablet Take 1 tablet (75 mcg total) by mouth daily before breakfast. 04/21/13   Kathyrn Drown, MD  loperamide (IMODIUM A-D) 2 MG tablet Take 2 mg by mouth daily as needed for diarrhea or loose stools.    Historical Provider, MD  megestrol (MEGACE) 20 MG tablet Take 20 mg by mouth daily.    Historical Provider, MD  midodrine (PROAMATINE) 5 MG tablet Take 1 tablet (5 mg total) by mouth 3 (three) times daily. 03/25/13   Rexene Alberts, MD  omeprazole (PRILOSEC) 20 MG capsule Take 1 capsule (20 mg total) by mouth daily. 04/26/13   Kathyrn Drown, MD  oxyCODONE-acetaminophen (PERCOCET/ROXICET) 5-325 MG per tablet Take 1 tablet by mouth 4 (four) times daily as needed for severe pain. 05/24/13   Kathyrn Drown, MD  oxyCODONE-acetaminophen (PERCOCET/ROXICET) 5-325 MG per tablet Take 1-2 tablets by mouth every 4 (four) hours as needed for moderate pain. 06/17/13   Ophelia Charter, MD  predniSONE (DELTASONE) 10 MG tablet Take 1 tablet (10 mg total) by mouth daily. For treatment of adrenal insufficiency. 03/25/13   Rexene Alberts, MD  sertraline (ZOLOFT) 25 MG tablet Take 1 tablet (25 mg total) by mouth daily. 04/01/13   Kathyrn Drown, MD   BP 123/56  Pulse 84  Temp(Src) 97.9  F (36.6 C) (Oral)  Resp 20  Ht 5\' 3"  (1.6 m)  Wt 89 lb (40.37 kg)  BMI 15.77 kg/m2  SpO2 97%  Physical Exam  Nursing note and vitals reviewed. Constitutional: She is oriented to person, place, and time. She appears well-developed.  HENT:  Head: Normocephalic.  Eyes: Conjunctivae and EOM are normal. Pupils are equal, round, and reactive to light.  Neck: Normal range of motion. Neck supple.  Cardiovascular: Normal rate, regular rhythm and normal heart sounds.   Pulmonary/Chest: Effort normal and breath sounds normal.  Abdominal: Soft. Bowel sounds are normal.  There is tenderness in the left lower quadrant.  Musculoskeletal: Normal range of motion.  Neurological: She is alert and oriented to person, place, and time.  Skin: Skin is warm and dry.  Psychiatric: She has a normal mood and affect. Her behavior is normal.    ED Course  Procedures (including critical care time)  DIAGNOSTIC STUDIES: Oxygen Saturation is 97% on room air, normal by my interpretation.    COORDINATION OF CARE: 1:32 PM-Discussed treatment plan which includes bloodwork, UA, IV fluids, and CT abdomen with pt at bedside and pt agreed to plan.    Results for orders placed during the hospital encounter of 06/19/13  CBC WITH DIFFERENTIAL      Result Value Ref Range   WBC 18.7 (*) 4.0 - 10.5 K/uL   RBC 3.94  3.87 - 5.11 MIL/uL   Hemoglobin 12.6  12.0 - 15.0 g/dL   HCT 98.3  38.2 - 50.5 %   MCV 96.4  78.0 - 100.0 fL   MCH 32.0  26.0 - 34.0 pg   MCHC 33.2  30.0 - 36.0 g/dL   RDW 39.7  67.3 - 41.9 %   Platelets 311  150 - 400 K/uL   Neutrophils Relative % 81 (*) 43 - 77 %   Neutro Abs 15.1 (*) 1.7 - 7.7 K/uL   Lymphocytes Relative 10 (*) 12 - 46 %   Lymphs Abs 1.8  0.7 - 4.0 K/uL   Monocytes Relative 9  3 - 12 %   Monocytes Absolute 1.7 (*) 0.1 - 1.0 K/uL   Eosinophils Relative 0  0 - 5 %   Eosinophils Absolute 0.0  0.0 - 0.7 K/uL   Basophils Relative 0  0 - 1 %   Basophils Absolute 0.0  0.0 - 0.1 K/uL   BASIC METABOLIC PANEL      Result Value Ref Range   Sodium 140  137 - 147 mEq/L   Potassium 3.0 (*) 3.7 - 5.3 mEq/L   Chloride 101  96 - 112 mEq/L   CO2 25  19 - 32 mEq/L   Glucose, Bld 130 (*) 70 - 99 mg/dL   BUN 21  6 - 23 mg/dL   Creatinine, Ser 3.79  0.50 - 1.10 mg/dL   Calcium 8.9  8.4 - 02.4 mg/dL   GFR calc non Af Amer 61 (*) >90 mL/min   GFR calc Af Amer 71 (*) >90 mL/min  URINALYSIS, ROUTINE W REFLEX MICROSCOPIC      Result Value Ref Range   Color, Urine YELLOW  YELLOW   APPearance CLEAR  CLEAR   Specific Gravity, Urine 1.025  1.005 - 1.030   pH 6.0  5.0 - 8.0   Glucose, UA NEGATIVE  NEGATIVE mg/dL   Hgb urine dipstick TRACE (*) NEGATIVE   Bilirubin Urine NEGATIVE  NEGATIVE   Ketones, ur NEGATIVE  NEGATIVE mg/dL   Protein, ur TRACE (*) NEGATIVE mg/dL   Urobilinogen, UA 0.2  0.0 - 1.0 mg/dL   Nitrite NEGATIVE  NEGATIVE   Leukocytes, UA NEGATIVE  NEGATIVE  HEPATIC FUNCTION PANEL      Result Value Ref Range   Total Protein 7.0  6.0 - 8.3 g/dL   Albumin 3.2 (*) 3.5 - 5.2 g/dL   AST 22  0 - 37 U/L   ALT 16  0 - 35 U/L   Alkaline Phosphatase 44  39 - 117 U/L   Total Bilirubin 0.5  0.3 - 1.2 mg/dL   Bilirubin, Direct <0.9  0.0 - 0.3 mg/dL   Indirect Bilirubin NOT CALCULATED  0.3 - 0.9 mg/dL  LIPASE, BLOOD      Result Value Ref Range   Lipase 63 (*) 11 - 59 U/L  URINE MICROSCOPIC-ADD ON      Result Value Ref Range   Squamous Epithelial / LPF RARE  RARE   WBC, UA 0-2  <3 WBC/hpf   RBC / HPF 3-6  <3 RBC/hpf   Bacteria, UA RARE  RARE   Casts HYALINE CASTS (*) NEGATIVE     Ct Abdomen Pelvis W Contrast  06/19/2013   CLINICAL DATA:  Left lower abdominal pain.  EXAM: CT ABDOMEN AND PELVIS WITH CONTRAST  TECHNIQUE: Multidetector CT imaging of the abdomen and pelvis was performed using the standard protocol following bolus administration of intravenous contrast.  CONTRAST:  75mL OMNIPAQUE IOHEXOL 300 MG/ML SOLN, 83mL OMNIPAQUE IOHEXOL 300 MG/ML SOLN  COMPARISON:  09/13/2008   FINDINGS: The lung bases are clear.  No pleural or pericardial effusion.  Normal appearance of the liver. Previous cholecystectomy. No biliary dilatation. The pancreas appears normal. Calcified splenic granulomas are noted.  The adrenal glands are both normal. Normal appearance of the left kidney. Normal appearance of the right kidney. The urinary bladder appears normal. Previous hysterectomy. Calcified atherosclerotic disease involves the abdominal aorta. The aorta is ectatic measuring 2.5 cm in AP dimension, image 24/series 2. No pathologically enlarged lymph nodes within the upper abdomen. No pelvic or inguinal adenopathy identified.  The patient has a large hiatal hernia. Large duodenal diverticulum is again identified. The small bowel loops have a normal course and caliber. Normal appearance of the proximal colon. There are multiple distal colonic diverticula. Apparent wall thickening of the sigmoid colon is identified. No significant inflammation or free fluid. No evidence for perforation.  Degenerative disc disease noted within the lumbar spine. This is most severe at L1-2.  IMPRESSION: 1. Distal colonic diverticulosis. There is apparent wall thickening involving the sigmoid colon without significant inflammation or free fluid. Correlate for any clinical signs or symptoms of colitis. 2. Atherosclerotic disease with ectasia of the abdominal aorta. Ectatic abdominal aorta at risk for aneurysm development. Recommend follow up by [place] in 5 years. This recommendation follows ACR consensus guidelines: White Paper of the ACR Incidental Findings Committee II on Vascular Findings. J Am Coll Radiol 2013; 10:789-794. 3. Hiatal hernia.   Electronically Signed   By: Kerby Moors M.D.   On: 06/19/2013 16:09        MDM   Final diagnoses:  Abdominal pain     No acute abdomen. Patient is most tender left lower quadrant. White count 18.7k.   CT scan shows thickening involving the sigmoid colon without significant  inflammation. Admit to general medicine    I personally performed the services described in this documentation, which was scribed in my presence. The recorded information has been reviewed and is accurate.    Kaitlyn Christen, MD 06/19/13 [phone]

## 2013-06-19 NOTE — Progress Notes (Addendum)
ANTIBIOTIC CONSULT NOTE - INITIAL  Pharmacy Consult for Cipro Indication: intra-abdominal infection  Allergies  Allergen Reactions  . Penicillins Rash    Patient Measurements: Height: 5\' 3"  (160 cm) Weight: 89 lb 1.1 oz (40.4 kg) IBW/kg (Calculated) : 52.4  Vital Signs: Temp: 97.7 F (36.5 C) (05/03 1842) Temp src: Oral (05/03 1842) BP: 150/59 mmHg (05/03 1842) Pulse Rate: 71 (05/03 1842) Intake/Output from previous day:   Intake/Output from this shift:    Labs:  Recent Labs  06/19/13 1217  WBC 18.7*  HGB 12.6  PLT 311  CREATININE 0.85   Estimated Creatinine Clearance: 31.4 ml/min (by C-G formula based on Cr of 0.85). No results found for this basename: VANCOTROUGH, Corlis Leak, VANCORANDOM, GENTTROUGH, GENTPEAK, GENTRANDOM, TOBRATROUGH, TOBRAPEAK, TOBRARND, AMIKACINPEAK, AMIKACINTROU, AMIKACIN,  in the last 72 hours   Microbiology: Recent Results (from the past 720 hour(s))  SURGICAL PCR SCREEN     Status: None   Collection Time    06/06/13  3:36 PM      Result Value Ref Range Status   MRSA, PCR NEGATIVE  NEGATIVE Final   Staphylococcus aureus NEGATIVE  NEGATIVE Final   Comment:            The Xpert SA Assay (FDA     approved for NASAL specimens     in patients over 57 years of age),     is one component of     a comprehensive surveillance     program.  Test performance has     been validated by [employer] for patients greater     than or equal to 56 year old.     It is not intended     to diagnose infection nor to     guide or monitor treatment.    Medical History: Past Medical History  Diagnosis Date  . Anemia     followed by Dr.Neijstrom  . Hyperlipidemia   . Hypothyroidism   . History of atherosclerotic cardiovascular disease   . History of colonoscopy     02/2002 showed few diverticula at sigmoid colon otherwise normal colonoscopy and terminal ileoscopy 1995 several colon polyps ,tubular adenoma  . GERD (gastroesophageal reflux disease)      chronic  . GERD (gastroesophageal reflux disease)     chronic gerd  . Tiredness   . T9 vertebral fracture 02/09/13    result of fall at home  . Adrenal insufficiency 03/23/2013  . Orthostatic hypotension 03/21/2013  . Pacemaker   . Heart murmur     "years ago"  . COPD (chronic obstructive pulmonary disease)   . Pneumonia   . Depression     takes Zoloft    Medications:  Scheduled:  . sodium chloride   Intravenous STAT  . amLODipine  5 mg Oral Daily  . aspirin EC  81 mg Oral Daily  . [START ON 06/20/2013] cholecalciferol  400 Units Oral Daily  . [START ON 06/29/2013] cyanocobalamin  1,000 mcg Intramuscular Q30 days  . docusate sodium  100 mg Oral BID  . [START ON 06/20/2013] feeding supplement (ENSURE COMPLETE)  237 mL Oral TID WC  . [START ON 06/20/2013] fludrocortisone  0.1 mg Oral Daily  . folic acid  1 mg Oral Daily  . heparin  5,000 Units Subcutaneous 3 times per day  . [START ON 06/20/2013] levothyroxine  75 mcg Oral QAC breakfast  . [START ON 06/20/2013] megestrol  20 mg Oral Daily  . [START ON 06/20/2013] midodrine  5 mg Oral TID AC  . multivitamin with minerals  1 tablet Oral Daily  . [START ON 06/20/2013] pantoprazole  40 mg Oral Daily  . [START ON 06/20/2013] predniSONE  10 mg Oral Daily  . [START ON 06/20/2013] sertraline  25 mg Oral Daily  . thiamine  100 mg Oral Daily   Assessment: 80 yo F admitted with abdominal pain presumed to be diverticulitis.  WBC elevated.   Renal function at patient's baseline.  Estimate CrCl ~ 30-6ml/min which is borderline for Cipro renal dose adjustment.   Cipro 5/3>>  Goal of Therapy:  Eradicate infection.  Plan:  Cipro 400mg  IV q12h Monitor renal function and cx data  ?Consider add Flagyl for anaerobic coverage  Lavonia Drafts Lindwood Mogel 06/19/2013,7:31 PM

## 2013-06-19 NOTE — ED Notes (Signed)
Sister reports pt had surgery for compression fracture at [facility] April 30th.  Was discharged May 1st.  Reports had something injected into the fracture that hardens like cement.   Pt c/o lower abd pain after she got home on May 1st.  Reports urinated often during the night but denies burning with urination.  Reports has been having diarrhea since yesterday afternoon.   Reports pain started before diarrhea so pt took a stool softener  And used a suppository yesterday.

## 2013-06-19 NOTE — ED Notes (Signed)
Pt up ambulatory to bathroom without difficulty.  States she urinated and had a bowel movement.  States her stomach feels better at this time.

## 2013-06-19 NOTE — H&P (Signed)
[facility] History and Physical  Kaitlyn Cooper UMP:[id number] DOB: [birth year] DOA: 06/19/2013  Referring physician: [name], MD PCP: Sallee Lange, MD   Chief Complaint: Abdominal Pain  HPI: Kaitlyn Cooper is a 80 y.o. female with chronic pain presented to the ED with abdominal pain. She states that she has had the pain since about yesterday. She states there is no rebound and the pain is not radiating. She states pain is located in the LLQ. Patient states she has had no fevers or chills. She does have some diarrhea but there is no blood seen in the stool. She has some mucus noted though. Patient was recently discharged from the hospital after have a T9 kyphoplasty performed. She states her pain control has been under good control.   Review of Systems:  Constitutional:  No weight loss, night sweats, Fevers, chills, fatigue.  HEENT:  No headaches, Difficulty swallowing  Cardio-vascular:  No chest pain, Orthopnea, PND GI:  No heartburn, indigestion, ++abdominal pain, No nausea, vomiting, ++diarrhea, ++change in bowel habits, ++loss of appetite  Resp:  No shortness of breath with exertion or at rest. No excess mucus, no productive cough  Skin:  no rash or lesions.  GU:  no dysuria, change in color of urine, no urgency or frequency.  Musculoskeletal:  No joint pain or swelling. No decreased range of motion. ++back pain.  Psych:  No change in mood or affect. No depression or anxiety.  Past Medical History  Diagnosis Date  . Anemia     followed by Dr.Neijstrom  . Hyperlipidemia   . Hypothyroidism   . History of atherosclerotic cardiovascular disease   . History of colonoscopy     02/2002 showed few diverticula at sigmoid colon otherwise normal colonoscopy and terminal ileoscopy 1995 several colon polyps ,tubular adenoma  . GERD (gastroesophageal reflux disease)     chronic  . GERD (gastroesophageal reflux disease)     chronic gerd  . Tiredness   . T9 vertebral  fracture 02/09/13    result of fall at home  . Adrenal insufficiency 03/23/2013  . Orthostatic hypotension 03/21/2013  . Pacemaker   . Heart murmur     "years ago"  . COPD (chronic obstructive pulmonary disease)   . Pneumonia   . Depression     takes Zoloft   Past Surgical History  Procedure Laterality Date  . Insert / replace / remove pacemaker  01/17/2009    [employer] Cardiac Pacemaker   . Cardiac catheterization  1998  . Cholecystectomy    . 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 Esophagogastroduodenoscopy (egd) with esophageal dilation  03/22/2012    Procedure: ESOPHAGOGASTRODUODENOSCOPY (EGD) WITH ESOPHAGEAL DILATION;  Surgeon: Rogene Houston, MD;  Location: [facility];  Service: Endoscopy;  Laterality: N/A;  730  . Colonoscopy N/A 12/29/2012    Procedure: COLONOSCOPY;  Surgeon: Rogene Houston, MD;  Location: [facility];  Service: Endoscopy;  Laterality: N/A;  130  . Esophagogastroduodenoscopy (egd) with esophageal dilation N/A 02/12/2013    Procedure: ESOPHAGOGASTRODUODENOSCOPY (EGD) WITH ESOPHAGEAL DILATION;  Surgeon: Rogene Houston, MD;  Location: [facility];  Service: Endoscopy;  Laterality: N/A;  . Breast surgery      lumpectomy - noncancerous  . Eye surgery Bilateral     cataract surgery   . Clavicle surgery Left   . Vaginal hysterectomy    . Kyphoplasty N/A 06/16/2013    Procedure: Thoracic Nine Kyphoplasty;  Surgeon: Dellis Filbert  Flo Shanks, MD;  Location: [facility];  Service: Neurosurgery;  Laterality: N/A;  Thoracic Nine Kyphoplasty  . Back surgery     Social History:  reports that she quit smoking about 41 years ago. She has never used smokeless tobacco. She reports that she does not drink alcohol or use illicit drugs.  Allergies  Allergen Reactions  . Penicillins Rash    No family history on file.   Prior to Admission medications   Medication Sig Start Date End Date Taking? Authorizing Provider  acetaminophen (TYLENOL) 500  MG tablet Take 1,000 mg by mouth every 6 (six) hours as needed. Pain   Yes Historical Provider, MD  Cholecalciferol (VITAMIN D) 400 UNITS capsule Take 400 Units by mouth daily.     Yes Historical Provider, MD  docusate sodium (COLACE) 100 MG capsule Take 100 mg by mouth daily as needed for mild constipation.    Yes Historical Provider, MD  feeding supplement, ENSURE COMPLETE, (ENSURE COMPLETE) LIQD Take 237 mLs by mouth 3 (three) times daily with meals. 03/25/13  Yes Rexene Alberts, MD  fludrocortisone (FLORINEF) 0.1 MG tablet Take 0.1 mg by mouth daily.   Yes Historical Provider, MD  levothyroxine (SYNTHROID, LEVOTHROID) 75 MCG tablet Take 1 tablet (75 mcg total) by mouth daily before breakfast. 04/21/13  Yes Kathyrn Drown, MD  loperamide (IMODIUM A-D) 2 MG tablet Take 2 mg by mouth daily as needed for diarrhea or loose stools.   Yes Historical Provider, MD  megestrol (MEGACE) 20 MG tablet Take 20 mg by mouth daily.   Yes Historical Provider, MD  midodrine (PROAMATINE) 5 MG tablet Take 1 tablet (5 mg total) by mouth 3 (three) times daily. 03/25/13  Yes Rexene Alberts, MD  omeprazole (PRILOSEC) 20 MG capsule Take 1 capsule (20 mg total) by mouth daily. 04/26/13  Yes Kathyrn Drown, MD  oxyCODONE-acetaminophen (PERCOCET/ROXICET) 5-325 MG per tablet Take 1-2 tablets by mouth every 4 (four) hours as needed for moderate pain. 06/17/13  Yes Ophelia Charter, MD  predniSONE (DELTASONE) 10 MG tablet Take 1 tablet (10 mg total) by mouth daily. For treatment of adrenal insufficiency. 03/25/13  Yes Rexene Alberts, MD  sertraline (ZOLOFT) 25 MG tablet Take 1 tablet (25 mg total) by mouth daily. 04/01/13  Yes Kathyrn Drown, MD  cyanocobalamin (,VITAMIN B-12,) 1000 MCG/ML injection Inject 1 mL (1,000 mcg total) into the muscle every 30 (thirty) days. 04/19/13   Kathyrn Drown, MD   Physical Exam: Filed Vitals:   06/19/13 1727  BP: 188/97  Pulse: 93  Temp:   Resp:     BP 188/97  Pulse 93  Temp(Src) 97.9 F (36.6 C)  (Oral)  Resp 18  Ht 5\' 3"  (1.6 m)  Wt 40.37 kg (89 lb)  BMI 15.77 kg/m2  SpO2 94%  General:  Appears calm and comfortable Eyes: PERRL, normal lids, irises & conjunctiva ENT: grossly normal hearing, lips & tongue Neck: no LAD, masses or thyromegaly Cardiovascular: RRR, no m/r/g. No LE edema. Respiratory: CTA bilaterally, no w/r/r. Normal respiratory effort. Abdomen: soft, LLQ tender Skin: no rash or induration seen on limited exam Musculoskeletal: grossly normal tone BUE/BLE Psychiatric: grossly normal mood and affect, speech fluent and appropriate Neurologic: grossly non-focal.          Labs on Admission:  Basic Metabolic Panel:  Recent Labs Lab 06/19/13 1217  NA 140  K 3.0*  CL 101  CO2 25  GLUCOSE 130*  BUN 21  CREATININE 0.85  CALCIUM  8.9   Liver Function Tests:  Recent Labs Lab 06/19/13 1331  AST 22  ALT 16  ALKPHOS 44  BILITOT 0.5  PROT 7.0  ALBUMIN 3.2*    Recent Labs Lab 06/19/13 1331  LIPASE 63*   No results found for this basename: AMMONIA,  in the last 168 hours CBC:  Recent Labs Lab 06/19/13 1217  WBC 18.7*  NEUTROABS 15.1*  HGB 12.6  HCT 38.0  MCV 96.4  PLT 311   Cardiac Enzymes: No results found for this basename: CKTOTAL, CKMB, CKMBINDEX, TROPONINI,  in the last 168 hours  BNP (last 3 results) No results found for this basename: PROBNP,  in the last 8760 hours CBG: No results found for this basename: GLUCAP,  in the last 168 hours  Radiological Exams on Admission: Ct Abdomen Pelvis W Contrast  06/19/2013   CLINICAL DATA:  Left lower abdominal pain.  EXAM: CT ABDOMEN AND PELVIS WITH CONTRAST  TECHNIQUE: Multidetector CT imaging of the abdomen and pelvis was performed using the standard protocol following bolus administration of intravenous contrast.  CONTRAST:  86mL OMNIPAQUE IOHEXOL 300 MG/ML SOLN, 12mL OMNIPAQUE IOHEXOL 300 MG/ML SOLN  COMPARISON:  09/13/2008  FINDINGS: The lung bases are clear.  No pleural or pericardial  effusion.  Normal appearance of the liver. Previous cholecystectomy. No biliary dilatation. The pancreas appears normal. Calcified splenic granulomas are noted.  The adrenal glands are both normal. Normal appearance of the left kidney. Normal appearance of the right kidney. The urinary bladder appears normal. Previous hysterectomy. Calcified atherosclerotic disease involves the abdominal aorta. The aorta is ectatic measuring 2.5 cm in AP dimension, image 24/series 2. No pathologically enlarged lymph nodes within the upper abdomen. No pelvic or inguinal adenopathy identified.  The patient has a large hiatal hernia. Large duodenal diverticulum is again identified. The small bowel loops have a normal course and caliber. Normal appearance of the proximal colon. There are multiple distal colonic diverticula. Apparent wall thickening of the sigmoid colon is identified. No significant inflammation or free fluid. No evidence for perforation.  Degenerative disc disease noted within the lumbar spine. This is most severe at L1-2.  IMPRESSION: 1. Distal colonic diverticulosis. There is apparent wall thickening involving the sigmoid colon without significant inflammation or free fluid. Correlate for any clinical signs or symptoms of colitis. 2. Atherosclerotic disease with ectasia of the abdominal aorta. Ectatic abdominal aorta at risk for aneurysm development. Recommend follow up by [place] in 5 years. This recommendation follows ACR consensus guidelines: White Paper of the ACR Incidental Findings Committee II on Vascular Findings. J Am Coll Radiol 2013; 10:789-794. 3. Hiatal hernia.   Electronically Signed   By: Kerby Moors M.D.   On: 06/19/2013 16:09     Assessment/Plan Principal Problem:   Abdominal pain Active Problems:   HYPOTHYROIDISM   HYPERLIPIDEMIA   Diverticulosis   1. Abdominal Pain -patient likely has diverticulitis though not seen on CT scan -will start on Cipro -Hydrate give IV fluids -pain  control  2. Hypothyroid -patient will be continued on home medications  3. Hyperlipidemia -repeat labs in am  4. Diverticulosis -may need GI evaluation -will continue with Cipro as ordered  5. Chronic Back pain -on pain meds  6. Elevated BP -will start on amlodipine -monitor blood pressure  Code Status: Full Code (must indicate code status--if unknown or must be presumed, indicate so) Family Communication: Sister in room (indicate person spoken with, if applicable, with phone number if by telephone) Disposition Plan: Home (indicate  anticipated LOS)  Time spent: 29min  Giuliano Preece A Phaedra Colgate [facility] Pager [phone]

## 2013-06-20 ENCOUNTER — Encounter (HOSPITAL_COMMUNITY): Payer: Self-pay | Admitting: Internal Medicine

## 2013-06-20 DIAGNOSIS — I951 Orthostatic hypotension: Secondary | ICD-10-CM

## 2013-06-20 DIAGNOSIS — K5792 Diverticulitis of intestine, part unspecified, without perforation or abscess without bleeding: Secondary | ICD-10-CM | POA: Diagnosis present

## 2013-06-20 DIAGNOSIS — E876 Hypokalemia: Secondary | ICD-10-CM | POA: Diagnosis present

## 2013-06-20 DIAGNOSIS — K5732 Diverticulitis of large intestine without perforation or abscess without bleeding: Secondary | ICD-10-CM

## 2013-06-20 DIAGNOSIS — E2749 Other adrenocortical insufficiency: Secondary | ICD-10-CM

## 2013-06-20 DIAGNOSIS — E785 Hyperlipidemia, unspecified: Secondary | ICD-10-CM | POA: Diagnosis present

## 2013-06-20 DIAGNOSIS — D72829 Elevated white blood cell count, unspecified: Secondary | ICD-10-CM | POA: Diagnosis present

## 2013-06-20 LAB — COMPREHENSIVE METABOLIC PANEL
ALBUMIN: 2.7 g/dL — AB (ref 3.5–5.2)
ALT: 12 U/L (ref 0–35)
AST: 16 U/L (ref 0–37)
Alkaline Phosphatase: 39 U/L (ref 39–117)
BUN: 12 mg/dL (ref 6–23)
CHLORIDE: 106 meq/L (ref 96–112)
CO2: 26 mEq/L (ref 19–32)
CREATININE: 0.72 mg/dL (ref 0.50–1.10)
Calcium: 8.1 mg/dL — ABNORMAL LOW (ref 8.4–10.5)
GFR calc Af Amer: 89 mL/min — ABNORMAL LOW (ref >90)
GFR, EST NON AFRICAN AMERICAN: 77 mL/min — AB (ref >90)
GLUCOSE: 88 mg/dL (ref 70–99)
Potassium: 3 mEq/L — ABNORMAL LOW (ref 3.7–5.3)
Sodium: 143 mEq/L (ref 137–147)
Total Bilirubin: 0.5 mg/dL (ref 0.3–1.2)
Total Protein: 5.9 g/dL — ABNORMAL LOW (ref 6.0–8.3)

## 2013-06-20 LAB — CBC
HCT: 32.7 % — ABNORMAL LOW (ref 36.0–46.0)
HEMOGLOBIN: 10.8 g/dL — AB (ref 12.0–15.0)
MCH: 31.7 pg (ref 26.0–34.0)
MCHC: 33 g/dL (ref 30.0–36.0)
MCV: 95.9 fL (ref 78.0–100.0)
Platelets: 265 10*3/uL (ref 150–400)
RBC: 3.41 MIL/uL — ABNORMAL LOW (ref 3.87–5.11)
RDW: 15 % (ref 11.5–15.5)
WBC: 12.1 10*3/uL — ABNORMAL HIGH (ref 4.0–10.5)

## 2013-06-20 LAB — LIPID PANEL
Cholesterol: 240 mg/dL — ABNORMAL HIGH (ref 0–200)
HDL: 48 mg/dL (ref >39)
LDL CALC: 151 mg/dL — AB (ref 0–99)
TRIGLYCERIDES: 206 mg/dL — AB (ref <150)
Total CHOL/HDL Ratio: 5 RATIO
VLDL: 41 mg/dL — ABNORMAL HIGH (ref 0–40)

## 2013-06-20 LAB — CLOSTRIDIUM DIFFICILE BY PCR: Toxigenic C. Difficile by PCR: POSITIVE — AB

## 2013-06-20 LAB — MAGNESIUM: Magnesium: 2.1 mg/dL (ref 1.5–2.5)

## 2013-06-20 LAB — GLUCOSE, CAPILLARY: GLUCOSE-CAPILLARY: 82 mg/dL (ref 70–99)

## 2013-06-20 LAB — TSH: TSH: 0.654 u[IU]/mL (ref 0.350–4.500)

## 2013-06-20 MED ORDER — MIDODRINE HCL 5 MG PO TABS
2.5000 mg | ORAL_TABLET | Freq: Two times a day (BID) | ORAL | Status: DC
  Administered 2013-06-21: 2.5 mg via ORAL
  Filled 2013-06-20 (×3): qty 0.5

## 2013-06-20 MED ORDER — METRONIDAZOLE 500 MG PO TABS
500.0000 mg | ORAL_TABLET | Freq: Three times a day (TID) | ORAL | Status: DC
  Administered 2013-06-20 – 2013-06-21 (×3): 500 mg via ORAL
  Filled 2013-06-20 (×3): qty 1

## 2013-06-20 MED ORDER — POTASSIUM CHLORIDE IN NACL 20-0.9 MEQ/L-% IV SOLN
INTRAVENOUS | Status: DC
  Administered 2013-06-20 – 2013-06-21 (×2): via INTRAVENOUS

## 2013-06-20 MED ORDER — CIPROFLOXACIN HCL 250 MG PO TABS
500.0000 mg | ORAL_TABLET | Freq: Two times a day (BID) | ORAL | Status: DC
  Administered 2013-06-20: 500 mg via ORAL
  Filled 2013-06-20: qty 2

## 2013-06-20 MED ORDER — POTASSIUM CHLORIDE CRYS ER 20 MEQ PO TBCR
20.0000 meq | EXTENDED_RELEASE_TABLET | Freq: Three times a day (TID) | ORAL | Status: AC
  Administered 2013-06-20 (×3): 20 meq via ORAL
  Filled 2013-06-20 (×3): qty 1

## 2013-06-20 MED ORDER — ATORVASTATIN CALCIUM 10 MG PO TABS
10.0000 mg | ORAL_TABLET | Freq: Every day | ORAL | Status: DC
  Administered 2013-06-20: 10 mg via ORAL
  Filled 2013-06-20: qty 1

## 2013-06-20 NOTE — Progress Notes (Signed)
The patient was seen and examined. Her chart, vital signs, laboratory studies were reviewed. She was discussed with nurse practitioner, Ms. Renard Hamper. Agree with her findings with additions below.  For more complete treatment of acute diverticulitis, we'll add oral metronidazole. We'll change Cipro to by mouth. We'll order C. difficile PCR to rule out C. difficile colitis given her recent loose stools this morning. We'll advance her diet as discussed with Ms. Renard Hamper. We'll discontinue Colace.  The patient has a history of adrenal insufficiency and orthostatic hypotension. She is treated chronically with prednisone, fludrocortisone, and Midodrine. Her blood pressure is elevated. She may not need both fludrocortisone and Midodrine. We'll hold further dosing of Midodrine for the remainder of the day and restarted at a lower dose tomorrow morning. Amlodipine was started for elevated blood pressure. This medication may or may not need to be discontinued upon discharge pending followup of her blood pressure was holding and reducing the dose of Midodrine. We'll order orthostatic vital signs tomorrow morning.  Hyperlipidemia-The patient's fasting lipid profile revealed a total cholesterol of 240, triglycerides 206, HDL cholesterol 48, and LDL cholesterol 151. We'll start Lipitor.

## 2013-06-20 NOTE — Progress Notes (Signed)
[facility] PROGRESS NOTE  ERENDIDA WRENN JKD:[id number] DOB: 1926/04/26 DOA: 06/19/2013 PCP: Sallee Lange, MD   Assessment/Plan: 1. Abdominal Pain: improved this am.  Patient likely has diverticulitis though not seen on CT scan. One episode diarrhea this am. White blood cell count trending downward. She is afebrile and non-toxic appearing. Continue Cipro day #2. Continue with gently IV fluids. Tolerating full liquid. Requesting food. Will advance diet.  2. Hypothyroid. Obtain TSH. continue home medications  3. Hyperlipidemia: await lipid panel.  4. Diverticulosis: see #1. Continue with Cipro day #2. 5. Chronic Back pain: likely related to compression fracture at T9 per chart. Much improved s/p kyphoplasty 4 days ago.  6.  Elevated BP: improved control. Home medications include midodorine. norvasc added at admission. Improved control. Monitor blood pressure 7. Hypokalemia: likely related to dehydration related to diarrhea and decreased po intake. Magnesium 2.1.  Will replete po and in IV fluids. Recheck in am    Code Status: full Family Communication: sister at bedside Disposition Plan: home hopefully tomorrow   Consultants:  none  Procedures:  none  Antibiotics:  cipro 06/19/13>>>  HPI/Subjective: Awake alert. Reports less abdominal pain. Reports 1 episode of "dysentery" this am. Denies nausea.   Objective: Filed Vitals:   06/20/13 0605  BP: 154/73  Pulse: 62  Temp: 98.1 F (36.7 C)  Resp: 16    Intake/Output Summary (Last 24 hours) at 06/20/13 1201 Last data filed at 06/20/13 0429  Gross per 24 hour  Intake      0 ml  Output    150 ml  Net   -150 ml   Filed Weights   06/19/13 1205 06/19/13 1842  Weight: 40.37 kg (89 lb) 40.4 kg (89 lb 1.1 oz)    Exam:   General:  80 year old woman, thin somewhat frail appearing NAD  Cardiovascular: S1 and S2. No MGR No LE edema  Respiratory: normal effort BS clear bilaterally no wheeze  Abdomen: flat, soft +BS  throughout. Mild tenderness LLQ otherwise non-tender to palpation. No guarding  Musculoskeletal: no clubbing or cyanosis. Joints without swelling/erythema   Data Reviewed: Basic Metabolic Panel:  Recent Labs Lab 06/19/13 1217 06/20/13 0639 06/20/13 0759  NA 140 143  --   K 3.0* 3.0*  --   CL 101 106  --   CO2 25 26  --   GLUCOSE 130* 88  --   BUN 21 12  --   CREATININE 0.85 0.72  --   CALCIUM 8.9 8.1*  --   MG  --   --  2.1   Liver Function Tests:  Recent Labs Lab 06/19/13 1331 06/20/13 0639  AST 22 16  ALT 16 12  ALKPHOS 44 39  BILITOT 0.5 0.5  PROT 7.0 5.9*  ALBUMIN 3.2* 2.7*    Recent Labs Lab 06/19/13 1331  LIPASE 63*   No results found for this basename: AMMONIA,  in the last 168 hours CBC:  Recent Labs Lab 06/19/13 1217 06/20/13 0639  WBC 18.7* 12.1*  NEUTROABS 15.1*  --   HGB 12.6 10.8*  HCT 38.0 32.7*  MCV 96.4 95.9  PLT 311 265   Cardiac Enzymes: No results found for this basename: CKTOTAL, CKMB, CKMBINDEX, TROPONINI,  in the last 168 hours BNP (last 3 results) No results found for this basename: PROBNP,  in the last 8760 hours CBG:  Recent Labs Lab 06/20/13 0840  GLUCAP 82    No results found for this or any previous visit (from the past  240 hour(s)).   Studies: Ct Abdomen Pelvis W Contrast  06/19/2013   CLINICAL DATA:  Left lower abdominal pain.  EXAM: CT ABDOMEN AND PELVIS WITH CONTRAST  TECHNIQUE: Multidetector CT imaging of the abdomen and pelvis was performed using the standard protocol following bolus administration of intravenous contrast.  CONTRAST:  84mL OMNIPAQUE IOHEXOL 300 MG/ML SOLN, 72mL OMNIPAQUE IOHEXOL 300 MG/ML SOLN  COMPARISON:  09/13/2008  FINDINGS: The lung bases are clear.  No pleural or pericardial effusion.  Normal appearance of the liver. Previous cholecystectomy. No biliary dilatation. The pancreas appears normal. Calcified splenic granulomas are noted.  The adrenal glands are both normal. Normal appearance of  the left kidney. Normal appearance of the right kidney. The urinary bladder appears normal. Previous hysterectomy. Calcified atherosclerotic disease involves the abdominal aorta. The aorta is ectatic measuring 2.5 cm in AP dimension, image 24/series 2. No pathologically enlarged lymph nodes within the upper abdomen. No pelvic or inguinal adenopathy identified.  The patient has a large hiatal hernia. Large duodenal diverticulum is again identified. The small bowel loops have a normal course and caliber. Normal appearance of the proximal colon. There are multiple distal colonic diverticula. Apparent wall thickening of the sigmoid colon is identified. No significant inflammation or free fluid. No evidence for perforation.  Degenerative disc disease noted within the lumbar spine. This is most severe at L1-2.  IMPRESSION: 1. Distal colonic diverticulosis. There is apparent wall thickening involving the sigmoid colon without significant inflammation or free fluid. Correlate for any clinical signs or symptoms of colitis. 2. Atherosclerotic disease with ectasia of the abdominal aorta. Ectatic abdominal aorta at risk for aneurysm development. Recommend follow up by [place] in 5 years. This recommendation follows ACR consensus guidelines: White Paper of the ACR Incidental Findings Committee II on Vascular Findings. J Am Coll Radiol 2013; 10:789-794. 3. Hiatal hernia.   Electronically Signed   By: Kerby Moors M.D.   On: 06/19/2013 16:09    Scheduled Meds: . amLODipine  5 mg Oral Daily  . aspirin EC  81 mg Oral Daily  . cholecalciferol  400 Units Oral Daily  . ciprofloxacin  400 mg Intravenous Q12H  . [START ON 06/21/2013] cyanocobalamin  1,000 mcg Intramuscular Q30 days  . docusate sodium  100 mg Oral BID  . feeding supplement (ENSURE COMPLETE)  237 mL Oral TID WC  . fludrocortisone  0.1 mg Oral Daily  . folic acid  1 mg Oral Daily  . heparin  5,000 Units Subcutaneous 3 times per day  . levothyroxine  75 mcg Oral QAC  breakfast  . megestrol  20 mg Oral Daily  . midodrine  5 mg Oral TID AC  . multivitamin with minerals  1 tablet Oral Daily  . pantoprazole  40 mg Oral Daily  . potassium chloride  20 mEq Oral TID  . predniSONE  10 mg Oral Daily  . sertraline  25 mg Oral Daily  . thiamine  100 mg Oral Daily   Continuous Infusions: . 0.9 % NaCl with KCl 20 mEq / L 60 mL/hr at 06/20/13 8127    Principal Problem:   Acute diverticulitis Active Problems:   HYPOTHYROIDISM   HYPERLIPIDEMIA   Abdominal pain   Hypokalemia   Leukocytosis, unspecified    Time spent: 35 minutes    [facility] Pager [phone]. If 7PM-7AM, please contact night-coverage at [email or web address], password [facility] 06/20/2013, 12:01 PM  LOS: 1 day

## 2013-06-20 NOTE — Progress Notes (Signed)
Utilization Review Complete  

## 2013-06-21 ENCOUNTER — Encounter (HOSPITAL_COMMUNITY): Payer: Self-pay | Admitting: Internal Medicine

## 2013-06-21 DIAGNOSIS — A0472 Enterocolitis due to Clostridium difficile, not specified as recurrent: Secondary | ICD-10-CM

## 2013-06-21 DIAGNOSIS — E039 Hypothyroidism, unspecified: Secondary | ICD-10-CM

## 2013-06-21 HISTORY — DX: Enterocolitis due to Clostridium difficile, not specified as recurrent: A04.72

## 2013-06-21 LAB — CBC
HCT: 30.2 % — ABNORMAL LOW (ref 36.0–46.0)
HEMOGLOBIN: 10 g/dL — AB (ref 12.0–15.0)
MCH: 31.9 pg (ref 26.0–34.0)
MCHC: 33.1 g/dL (ref 30.0–36.0)
MCV: 96.5 fL (ref 78.0–100.0)
PLATELETS: 254 10*3/uL (ref 150–400)
RBC: 3.13 MIL/uL — AB (ref 3.87–5.11)
RDW: 15.1 % (ref 11.5–15.5)
WBC: 11.5 10*3/uL — ABNORMAL HIGH (ref 4.0–10.5)

## 2013-06-21 LAB — BASIC METABOLIC PANEL
BUN: 12 mg/dL (ref 6–23)
CO2: 23 meq/L (ref 19–32)
Calcium: 8.2 mg/dL — ABNORMAL LOW (ref 8.4–10.5)
Chloride: 109 mEq/L (ref 96–112)
Creatinine, Ser: 0.69 mg/dL (ref 0.50–1.10)
GFR calc Af Amer: >90 mL/min (ref >90)
GFR calc non Af Amer: 78 mL/min — ABNORMAL LOW (ref >90)
GLUCOSE: 107 mg/dL — AB (ref 70–99)
POTASSIUM: 4.7 meq/L (ref 3.7–5.3)
SODIUM: 141 meq/L (ref 137–147)

## 2013-06-21 LAB — GLUCOSE, CAPILLARY: GLUCOSE-CAPILLARY: 90 mg/dL (ref 70–99)

## 2013-06-21 MED ORDER — METRONIDAZOLE 500 MG PO TABS: 500.0000 mg | ORAL_TABLET | Freq: Three times a day (TID) | ORAL | Status: DC

## 2013-06-21 MED ORDER — THIAMINE HCL 100 MG PO TABS: 100.0000 mg | ORAL_TABLET | Freq: Every day | ORAL | Status: DC

## 2013-06-21 MED ORDER — ATORVASTATIN CALCIUM 10 MG PO TABS: 10.0000 mg | ORAL_TABLET | Freq: Every day | ORAL | Status: DC

## 2013-06-21 MED ORDER — FOLIC ACID 1 MG PO TABS: 1.0000 mg | ORAL_TABLET | Freq: Every day | ORAL | Status: DC

## 2013-06-21 NOTE — Discharge Summary (Signed)
Physician Discharge Summary  Kaitlyn Cooper [id number] DOB: [birth year] DOA: 06/19/2013  PCP: Sallee Lange, MD  Admit date: 06/19/2013 Discharge date: 06/21/2013  Time spent: 40 minutes  Recommendations for Outpatient Follow-up:  1. Follow up with Dr Wolfgang Phoenix PCP in 1-2 weeks for evaluation of symptoms and monitoring of BP 2. Has appointment with Dr Dorris Fetch next week.  3. Recommend low residue diet ( Discharge Diagnoses:  Principal Problem:   Acute diverticulitis Active Problems:   HYPOTHYROIDISM   Orthostatic hypotension   Adrenal insufficiency   Abdominal pain   Hypokalemia   Leukocytosis, unspecified   Hyperlipidemia LDL goal < 130   C. difficile colitis   Discharge Condition: stable  Diet recommendation: heart healthy/low residue  [facility] Weights   06/19/13 1205 06/19/13 1842  Weight: 40.37 kg (89 lb) 40.4 kg (89 lb 1.1 oz)    History of present illness:  Kaitlyn Cooper is a 80 y.o. female with chronic pain presented to the ED on 06/19/13 with abdominal pain. She stated that she  had the pain since  The day before. She statedthere was no rebound and the pain was not radiating. She stated pain was located in the LLQ. Patient stated she had no fevers or chills. She did have some diarrhea but there was no blood seen in the stool. She had some mucus noted though. Patient was recently discharged from the hospital after have a T9 kyphoplasty performed. She stated her pain control has been under good control.  Hospital Course:  1. Diverticulitis: Not obvious on CT.  Admitted and provided with bowel rest and cipo. Flagyl added.  Pain quickly improve. One episode diarrhea noted to be + c diff. Cipro discontinued. No further episodes of diarrhea.  White blood cell count trending downward and is 11.5 at discharge. She remained afebrile and non-toxic appearing. At discharge tolerating food without problem. Will discharge with 14 day course Flagyl. Follow up with PCP 1 week.  2. Hypothyroid.  TSH 0.654. continue home medications  3. Hyperlipidemia: Lipid panel yields total cholesterol 240, triglycerides 206, HDL 48 and LDL 151. Lipitor started.   4. Diverticulosis: see #1. Continue with flagyl. 5. Chronic Back pain: likely related to compression fracture at T9 per chart.  s/p kyphoplasty 5 days ago. Pain controlled  6. Elevated BP: Hx of adrenal insufficiency and orthostatic hypotension.  Home medications include midodorine and florinef and prednisone. She was hypertensive on admission so norvasc added. Discussed with Dr Dorris Fetch who recommended discontinuing midodorine at discharge as BP trending up. norvasc discontinued at discharge as well. On day of discharge somewhat orthostatic with SBP 152 lying and 108 standing.  She has appointment with Dr Dorris Fetch next week. Recommend trending BP and mediations.  7. Hypokalemia: likely related to dehydration related to diarrhea and decreased po intake. Magnesium 2.1. Resolved at discharge 8. C diff colitis: see #1.      Procedures:  none  Consultations:  none  Discharge Exam: Filed Vitals:   06/21/13 0607  BP: 108/66  Pulse: 80  Temp: 97.6 F (36.4 C)  Resp: 18    General: thin somewhat frail appearing. Appears comfortable Cardiovascular: S1 and S2. No MGR No LE edema Respiratory: normal effort BS clear bilaterally. No wheeze no rhonchi Abdomen: flat soft +BS non-distended. Non-tender to palpation  Discharge Instructions You were cared for by a hospitalist during your hospital stay. If you have any questions about your discharge medications or the care you received while you were in the hospital after you are  discharged, you can call the unit and asked to speak with the hospitalist on call if the hospitalist that took care of you is not available. Once you are discharged, your primary care physician will handle any further medical issues. Please note that NO REFILLS for any discharge medications will be authorized once you are  discharged, as it is imperative that you return to your primary care physician (or establish a relationship with a primary care physician if you do not have one) for your aftercare needs so that they can reassess your need for medications and monitor your lab values.  Discharge Orders   Future Orders Complete By Expires   Diet - low sodium heart healthy  As directed    Discharge instructions  As directed    Increase activity slowly  As directed        Medication List    STOP taking these medications       midodrine 5 MG tablet  Commonly known as:  PROAMATINE      TAKE these medications       acetaminophen 500 MG tablet  Commonly known as:  TYLENOL  Take 1,000 mg by mouth every 6 (six) hours as needed. Pain     atorvastatin 10 MG tablet  Commonly known as:  LIPITOR  Take 1 tablet (10 mg total) by mouth daily at 6 PM.     cyanocobalamin 1000 MCG/ML injection  Commonly known as:  (VITAMIN B-12)  Inject 1 mL (1,000 mcg total) into the muscle every 30 (thirty) days.     docusate sodium 100 MG capsule  Commonly known as:  COLACE  Take 100 mg by mouth daily as needed for mild constipation.     feeding supplement (ENSURE COMPLETE) Liqd  Take 237 mLs by mouth 3 (three) times daily with meals.     fludrocortisone 0.1 MG tablet  Commonly known as:  FLORINEF  Take 0.1 mg by mouth daily.     folic acid 1 MG tablet  Commonly known as:  FOLVITE  Take 1 tablet (1 mg total) by mouth daily.     IMODIUM A-D 2 MG tablet  Generic drug:  loperamide  Take 2 mg by mouth daily as needed for diarrhea or loose stools.     levothyroxine 75 MCG tablet  Commonly known as:  SYNTHROID, LEVOTHROID  Take 1 tablet (75 mcg total) by mouth daily before breakfast.     megestrol 20 MG tablet  Commonly known as:  MEGACE  Take 20 mg by mouth daily.     metroNIDAZOLE 500 MG tablet  Commonly known as:  FLAGYL  Take 1 tablet (500 mg total) by mouth 3 (three) times daily.     omeprazole 20 MG  capsule  Commonly known as:  PRILOSEC  Take 1 capsule (20 mg total) by mouth daily.     oxyCODONE-acetaminophen 5-325 MG per tablet  Commonly known as:  PERCOCET/ROXICET  Take 1-2 tablets by mouth every 4 (four) hours as needed for moderate pain.     predniSONE 10 MG tablet  Commonly known as:  DELTASONE  Take 1 tablet (10 mg total) by mouth daily. For treatment of adrenal insufficiency.     sertraline 25 MG tablet  Commonly known as:  ZOLOFT  Take 1 tablet (25 mg total) by mouth daily.     thiamine 100 MG tablet  Take 1 tablet (100 mg total) by mouth daily.     Vitamin D 400 UNITS capsule  Take 400  Units by mouth daily.       Allergies  Allergen Reactions  . Penicillins Rash       Follow-up Information   Follow up with LUKING,SCOTT, MD. Schedule an appointment as soon as possible for a visit in 1 week. (for evaluation of symptoms and montoring of Bp)    Specialty:  Family Medicine   Contact information:   [place] [id number] [phone]        The results of significant diagnostics from this hospitalization (including imaging, microbiology, ancillary and laboratory) are listed below for reference.    Significant Diagnostic Studies: Dg Thoracic Spine 2 View  06/16/2013   CLINICAL DATA:  Thoracic compression fracture.  FLUOROSCOPY TIME:  2 min 30 seconds  EXAM: THORACIC SPINE - 2 VIEW; DG C-ARM 1-60 MIN  COMPARISON:  Radiographs dated 05/02/2013  FINDINGS: AP and lateral C-arm images demonstrate the patient has undergone kyphoplasty at T9.  IMPRESSION: Kyphoplasty performed at T9.   Electronically Signed   By: Rozetta Nunnery M.D.   On: 06/16/2013 14:22   Ct Abdomen Pelvis W Contrast  06/19/2013   CLINICAL DATA:  Left lower abdominal pain.  EXAM: CT ABDOMEN AND PELVIS WITH CONTRAST  TECHNIQUE: Multidetector CT imaging of the abdomen and pelvis was performed using the standard protocol following bolus administration of intravenous contrast.   CONTRAST:  45mL OMNIPAQUE IOHEXOL 300 MG/ML SOLN, 24mL OMNIPAQUE IOHEXOL 300 MG/ML SOLN  COMPARISON:  09/13/2008  FINDINGS: The lung bases are clear.  No pleural or pericardial effusion.  Normal appearance of the liver. Previous cholecystectomy. No biliary dilatation. The pancreas appears normal. Calcified splenic granulomas are noted.  The adrenal glands are both normal. Normal appearance of the left kidney. Normal appearance of the right kidney. The urinary bladder appears normal. Previous hysterectomy. Calcified atherosclerotic disease involves the abdominal aorta. The aorta is ectatic measuring 2.5 cm in AP dimension, image 24/series 2. No pathologically enlarged lymph nodes within the upper abdomen. No pelvic or inguinal adenopathy identified.  The patient has a large hiatal hernia. Large duodenal diverticulum is again identified. The small bowel loops have a normal course and caliber. Normal appearance of the proximal colon. There are multiple distal colonic diverticula. Apparent wall thickening of the sigmoid colon is identified. No significant inflammation or free fluid. No evidence for perforation.  Degenerative disc disease noted within the lumbar spine. This is most severe at L1-2.  IMPRESSION: 1. Distal colonic diverticulosis. There is apparent wall thickening involving the sigmoid colon without significant inflammation or free fluid. Correlate for any clinical signs or symptoms of colitis. 2. Atherosclerotic disease with ectasia of the abdominal aorta. Ectatic abdominal aorta at risk for aneurysm development. Recommend follow up by [place] in 5 years. This recommendation follows ACR consensus guidelines: White Paper of the ACR Incidental Findings Committee II on Vascular Findings. J Am Coll Radiol 2013; 10:789-794. 3. Hiatal hernia.   Electronically Signed   By: Kerby Moors M.D.   On: 06/19/2013 16:09   Dg C-arm 1-60 Min  06/16/2013   CLINICAL DATA:  Thoracic compression fracture.  FLUOROSCOPY TIME:  2  min 30 seconds  EXAM: THORACIC SPINE - 2 VIEW; DG C-ARM 1-60 MIN  COMPARISON:  Radiographs dated 05/02/2013  FINDINGS: AP and lateral C-arm images demonstrate the patient has undergone kyphoplasty at T9.  IMPRESSION: Kyphoplasty performed at T9.   Electronically Signed   By: Rozetta Nunnery M.D.   On: 06/16/2013 14:22    Microbiology: Recent Results (  from the past 240 hour(s))  CLOSTRIDIUM DIFFICILE BY PCR     Status: Abnormal   Collection Time    06/20/13  5:44 PM      Result Value Ref Range Status   C difficile by pcr POSITIVE (*) NEGATIVE Final   Comment: CRITICAL RESULT CALLED TO, READ BACK BY AND VERIFIED WITH:     COVINGTON,L. AT 1919 ON 06/20/2013 BY BAUGHAM,M.     Labs: Basic Metabolic Panel:  Recent Labs Lab 06/19/13 1217 06/20/13 0639 06/20/13 0759 06/21/13 0550  NA 140 143  --  141  K 3.0* 3.0*  --  4.7  CL 101 106  --  109  CO2 25 26  --  23  GLUCOSE 130* 88  --  107*  BUN 21 12  --  12  CREATININE 0.85 0.72  --  0.69  CALCIUM 8.9 8.1*  --  8.2*  MG  --   --  2.1  --    Liver Function Tests:  Recent Labs Lab 06/19/13 1331 06/20/13 0639  AST 22 16  ALT 16 12  ALKPHOS 44 39  BILITOT 0.5 0.5  PROT 7.0 5.9*  ALBUMIN 3.2* 2.7*    Recent Labs Lab 06/19/13 1331  LIPASE 63*   No results found for this basename: AMMONIA,  in the last 168 hours CBC:  Recent Labs Lab 06/19/13 1217 06/20/13 0639 06/21/13 0550  WBC 18.7* 12.1* 11.5*  NEUTROABS 15.1*  --   --   HGB 12.6 10.8* 10.0*  HCT 38.0 32.7* 30.2*  MCV 96.4 95.9 96.5  PLT 311 265 254   Cardiac Enzymes: No results found for this basename: CKTOTAL, CKMB, CKMBINDEX, TROPONINI,  in the last 168 hours BNP: BNP (last 3 results) No results found for this basename: PROBNP,  in the last 8760 hours CBG:  Recent Labs Lab 06/20/13 0840 06/21/13 0800  GLUCAP 82 90       Signed:  Lezlie Octave Kimberlea Schlag  [facility] 06/21/2013, 12:23 PM

## 2013-06-21 NOTE — Evaluation (Signed)
Physical Therapy Evaluation Patient Details Name: Kaitlyn Cooper MRN: [id number] DOB: 08-Aug-1926 Today's Date: 06/21/2013   History of Present Illness  Kaitlyn Cooper is a 80 y.o. female with chronic pain presented to the ED with abdominal pain. She states that she has had the pain since about yesterday. She states there is no rebound and the pain is not radiating. She states pain is located in the LLQ. Patient states she has had no fevers or chills. She does have some diarrhea but there is no blood seen in the stool. She has some mucus noted though. Patient was recently discharged from the hospital after have a T9 kyphoplasty performed. She states her pain control has been under good control.  Clinical Impression  Pt is modified Independent with basic mobility on the unit on level surfaces. Pt has excellent family support and will have assistance at home by her sister if needed. Pt is currently at her baseline and do not feel she needs any further skilled PT. Recommend ambulation on the unit with her sister until d/c home. Pt is agreeable to recommendations.    Follow Up Recommendations No PT follow up    Equipment Recommendations       Recommendations for Other Services       Precautions / Restrictions Precautions Precautions: Fall;Back Precaution Comments: Reviewed back precautions Restrictions Weight Bearing Restrictions: No      Mobility  Bed Mobility Overal bed mobility: Independent Bed Mobility: Sidelying to Sit Rolling: Independent   Supine to sit: Modified independent (Device/Increase time)        Transfers Overall transfer level: Modified independent Equipment used: None             General transfer comment: use of hands for stability  Ambulation/Gait Ambulation/Gait assistance: Modified independent (Device/Increase time) Ambulation Distance (Feet): 100 Feet Assistive device: None Gait Pattern/deviations: Step-through pattern;Decreased stride  length;Trunk flexed   Gait velocity interpretation: Below normal speed for age/gender General Gait Details: pt is independent on level surfaces with no challenges to balance.  Stairs            Wheelchair Mobility    Modified Rankin (Stroke Patients Only)       Balance Overall balance assessment: Needs assistance   Sitting balance-Leahy Scale: Normal     Standing balance support: No upper extremity supported Standing balance-Leahy Scale: Good                               Pertinent Vitals/Pain     Home Living Family/patient expects to be discharged to:: Private residence Living Arrangements: Other relatives Available Help at Discharge: Family Type of Home: House Home Access: Stairs to enter Entrance Stairs-Rails: None Entrance Stairs-Number of Steps: 2 or 5 depending on enterence Home Layout: One level Home Equipment: Environmental consultant - 2 wheels Additional Comments: pt's sister lives with her    Prior Function Level of Independence: Independent (independent in the house)   Gait / Transfers Assistance Needed: independent household ambulator, assistance with steps and outside ambulation           Hand Dominance        Extremity/Trunk Assessment   Upper Extremity Assessment: Defer to OT evaluation           Lower Extremity Assessment: Overall WFL for tasks assessed      Cervical / Trunk Assessment: Kyphotic  Communication   Communication: No difficulties  Cognition Arousal/Alertness: Awake/alert Behavior During Therapy:  WFL for tasks assessed/performed Overall Cognitive Status: Within Functional Limits for tasks assessed                      General Comments      Exercises        Assessment/Plan    PT Assessment Patent does not need any further PT services  PT Diagnosis     PT Problem List    PT Treatment Interventions     PT Goals (Current goals can be found in the Care Plan section)      Frequency     Barriers  to discharge        Co-evaluation               End of Session Equipment Utilized During Treatment: Gait belt Activity Tolerance: Patient tolerated treatment well Patient left: in chair;with call bell/phone within reach Nurse Communication: Mobility status         Time: 1150-1210 PT Time Calculation (min): 20 min   Charges:   PT Evaluation $Initial PT Evaluation Tier I: 1 Procedure PT Treatments $Gait Training: 8-22 mins   PT G Codes:          Kaitlyn Cooper 06/21/2013, 12:25 PM

## 2013-06-21 NOTE — Discharge Summary (Signed)
The patient was seen and examined. Her chart, vital signs, and laboratory studies were reviewed. She was discussed with nurse practitioner, Ms. Renard Hamper. Agree with her findings and plan with the dishes below.  Discharge diagnoses:  1. C. Difficile colitis. The patient had received antibiotics during her kyphoplasty. We'll continue treatment with metronidazole for 14 more days. 2. Possible acute diverticulitis. Upon further review of the CT scan on admission, the findings appear to be more consistent with colitis. 3.  Adrenal insufficiency and history of orthostatic hypotension. She is now followed by endocrinologist, Dr. Dorris Fetch. She had been treated chronically with Florinef, prednisone, and Midodrine.her blood pressure was noted to be elevated during hospitalization, likely from Florinef and Midodrine. Upon discussion with Dr. Dorris Fetch, the Midodrine was discontinued.  4. Elevated blood pressure. I am not giving her a diagnosis of hypertension because I believe the elevated blood pressure was/is the consequence of the combination of Florinef and midodrine. 5. hypokalemia. Supplement and repleted.  6. Hyperlipidemia. Lipitor started for a lipid panel that revealed total cholesterol 240, triglycerides 206, HDL cholesterol 48, and LDL cholesterol 151.

## 2013-06-21 NOTE — Progress Notes (Signed)
Discharge instructions given to sister, verbalized understanding, out in stable condition via w/c with staff.

## 2013-07-01 ENCOUNTER — Encounter: Payer: Self-pay | Admitting: Family Medicine

## 2013-07-01 ENCOUNTER — Ambulatory Visit (INDEPENDENT_AMBULATORY_CARE_PROVIDER_SITE_OTHER): Payer: Medicare Other | Admitting: Family Medicine

## 2013-07-01 VITALS — BP 92/58 | Ht 63.0 in | Wt 88.0 lb

## 2013-07-01 DIAGNOSIS — D509 Iron deficiency anemia, unspecified: Secondary | ICD-10-CM

## 2013-07-01 DIAGNOSIS — N179 Acute kidney failure, unspecified: Secondary | ICD-10-CM

## 2013-07-01 DIAGNOSIS — E876 Hypokalemia: Secondary | ICD-10-CM

## 2013-07-01 DIAGNOSIS — I2510 Atherosclerotic heart disease of native coronary artery without angina pectoris: Secondary | ICD-10-CM

## 2013-07-01 DIAGNOSIS — A0472 Enterocolitis due to Clostridium difficile, not specified as recurrent: Secondary | ICD-10-CM

## 2013-07-01 NOTE — Progress Notes (Signed)
   Subjective:    Patient ID: Kaitlyn Cooper, female    DOB: Jun 19, 1926, 80 y.o.   MRN: [id number]  HPI Patient is here today for a hospitalization follow up visit. Patient was admitted to [facility] on 06/19/13 for diverticulitis. Patient is still on antibiotic therapy. Weakness still noted.   Patient has no other concerns noted at this time.  Review of the hospital records labs CAT scans and notes were done in the presence of the patient She does relate some fatigue and tiredness She also relates ongoing back pain for which she is taking her pain medicine She also states abdomen is feeling okay no severe cramping or discomfort she is able to walk around she is eating better.  Review of Systems Denies bloody stools vomiting nausea fever chills sweats denies chest pain she does relate back pain and she also relates fatigue and tiredness    Objective:   Physical Exam  Nursing note and vitals reviewed. Constitutional: She appears well-developed.  HENT:  Head: Normocephalic.  Nose: Nose normal.  Mouth/Throat: Oropharynx is clear and moist. No oropharyngeal exudate.  Neck: Neck supple.  Cardiovascular: Normal rate and normal heart sounds.   No murmur heard. Pulmonary/Chest: Effort normal and breath sounds normal. She has no wheezes.  Abdominal: Soft. There is no tenderness.  Lymphadenopathy:    She has no cervical adenopathy.  Skin: Skin is warm and dry.  no edema        Assessment & Plan:  1. Acute renal failure Patient had low potassium low calcium as well as elevated creatinine while in the hospital recheck metabolic 7 - Basic metabolic panel - CBC with Differential  2. Iron deficiency anemia Patient with history of iron deficient anemia she was anemic in the hospital it did go down some we need to recheck this - Basic metabolic panel - CBC with Differential  3. Hypokalemia Patient had hypokalemia in the hospital would need to check this again make sure it's corrected - Basic  metabolic panel - CBC with Differential  4. Enteritis due to Clostridium difficile Patient not having symptoms of C. difficile currently. She needs to finish out her antibiotic she was warned about reoccurrence and to followup if any problems  Fatigue and tiredness is standard for this patient but she is actually doing better than what she was on by to see her back in 2 months

## 2013-07-05 ENCOUNTER — Encounter (HOSPITAL_COMMUNITY)
Admission: EM | Disposition: A | Discharge status: Home / Self Care | Payer: Self-pay | Source: Home / Self Care | Attending: Internal Medicine

## 2013-07-05 ENCOUNTER — Inpatient Hospital Stay (HOSPITAL_COMMUNITY)
Admission: EM | Admit: 2013-07-05 | Discharge: 2013-07-06 | DRG: 394 | Disposition: A | Discharge status: Home / Self Care | Payer: Medicare Other | Attending: Internal Medicine | Admitting: Internal Medicine

## 2013-07-05 ENCOUNTER — Telehealth: Payer: Self-pay

## 2013-07-05 ENCOUNTER — Emergency Department (HOSPITAL_COMMUNITY): Payer: Medicare Other

## 2013-07-05 ENCOUNTER — Encounter (HOSPITAL_COMMUNITY): Payer: Self-pay | Admitting: Emergency Medicine

## 2013-07-05 DIAGNOSIS — R197 Diarrhea, unspecified: Secondary | ICD-10-CM

## 2013-07-05 DIAGNOSIS — I951 Orthostatic hypotension: Secondary | ICD-10-CM

## 2013-07-05 DIAGNOSIS — D509 Iron deficiency anemia, unspecified: Secondary | ICD-10-CM

## 2013-07-05 DIAGNOSIS — F3289 Other specified depressive episodes: Secondary | ICD-10-CM

## 2013-07-05 DIAGNOSIS — F329 Major depressive disorder, single episode, unspecified: Secondary | ICD-10-CM

## 2013-07-05 DIAGNOSIS — D649 Anemia, unspecified: Secondary | ICD-10-CM | POA: Diagnosis present

## 2013-07-05 DIAGNOSIS — S22000A Wedge compression fracture of unspecified thoracic vertebra, initial encounter for closed fracture: Secondary | ICD-10-CM

## 2013-07-05 DIAGNOSIS — R634 Abnormal weight loss: Secondary | ICD-10-CM

## 2013-07-05 DIAGNOSIS — K5790 Diverticulosis of intestine, part unspecified, without perforation or abscess without bleeding: Secondary | ICD-10-CM

## 2013-07-05 DIAGNOSIS — Z79899 Other long term (current) drug therapy: Secondary | ICD-10-CM

## 2013-07-05 DIAGNOSIS — Z950 Presence of cardiac pacemaker: Secondary | ICD-10-CM

## 2013-07-05 DIAGNOSIS — M899 Disorder of bone, unspecified: Secondary | ICD-10-CM

## 2013-07-05 DIAGNOSIS — R1314 Dysphagia, pharyngoesophageal phase: Secondary | ICD-10-CM

## 2013-07-05 DIAGNOSIS — E875 Hyperkalemia: Secondary | ICD-10-CM

## 2013-07-05 DIAGNOSIS — E785 Hyperlipidemia, unspecified: Secondary | ICD-10-CM

## 2013-07-05 DIAGNOSIS — K219 Gastro-esophageal reflux disease without esophagitis: Secondary | ICD-10-CM

## 2013-07-05 DIAGNOSIS — K5792 Diverticulitis of intestine, part unspecified, without perforation or abscess without bleeding: Secondary | ICD-10-CM

## 2013-07-05 DIAGNOSIS — Z8719 Personal history of other diseases of the digestive system: Secondary | ICD-10-CM

## 2013-07-05 DIAGNOSIS — H9190 Unspecified hearing loss, unspecified ear: Secondary | ICD-10-CM | POA: Diagnosis present

## 2013-07-05 DIAGNOSIS — N179 Acute kidney failure, unspecified: Secondary | ICD-10-CM

## 2013-07-05 DIAGNOSIS — Z860100 Personal history of colon polyps, unspecified: Secondary | ICD-10-CM

## 2013-07-05 DIAGNOSIS — D510 Vitamin B12 deficiency anemia due to intrinsic factor deficiency: Secondary | ICD-10-CM

## 2013-07-05 DIAGNOSIS — T18128A Food in esophagus causing other injury, initial encounter: Secondary | ICD-10-CM

## 2013-07-05 DIAGNOSIS — K529 Noninfective gastroenteritis and colitis, unspecified: Secondary | ICD-10-CM

## 2013-07-05 DIAGNOSIS — R509 Fever, unspecified: Secondary | ICD-10-CM

## 2013-07-05 DIAGNOSIS — Z800 Family history of malignant neoplasm of digestive organs: Secondary | ICD-10-CM

## 2013-07-05 DIAGNOSIS — A0472 Enterocolitis due to Clostridium difficile, not specified as recurrent: Secondary | ICD-10-CM

## 2013-07-05 DIAGNOSIS — R1319 Other dysphagia: Secondary | ICD-10-CM

## 2013-07-05 DIAGNOSIS — R109 Unspecified abdominal pain: Secondary | ICD-10-CM

## 2013-07-05 DIAGNOSIS — M949 Disorder of cartilage, unspecified: Secondary | ICD-10-CM

## 2013-07-05 DIAGNOSIS — D72829 Elevated white blood cell count, unspecified: Secondary | ICD-10-CM

## 2013-07-05 DIAGNOSIS — T18108A Unspecified foreign body in esophagus causing other injury, initial encounter: Principal | ICD-10-CM | POA: Diagnosis present

## 2013-07-05 DIAGNOSIS — M810 Age-related osteoporosis without current pathological fracture: Secondary | ICD-10-CM

## 2013-07-05 DIAGNOSIS — H8110 Benign paroxysmal vertigo, unspecified ear: Secondary | ICD-10-CM

## 2013-07-05 DIAGNOSIS — S22070A Wedge compression fracture of T9-T10 vertebra, initial encounter for closed fracture: Secondary | ICD-10-CM

## 2013-07-05 DIAGNOSIS — E2740 Unspecified adrenocortical insufficiency: Secondary | ICD-10-CM

## 2013-07-05 DIAGNOSIS — Z87891 Personal history of nicotine dependence: Secondary | ICD-10-CM

## 2013-07-05 DIAGNOSIS — K224 Dyskinesia of esophagus: Secondary | ICD-10-CM | POA: Diagnosis present

## 2013-07-05 DIAGNOSIS — I498 Other specified cardiac arrhythmias: Secondary | ICD-10-CM

## 2013-07-05 DIAGNOSIS — E2749 Other adrenocortical insufficiency: Secondary | ICD-10-CM

## 2013-07-05 DIAGNOSIS — E039 Hypothyroidism, unspecified: Secondary | ICD-10-CM

## 2013-07-05 DIAGNOSIS — K222 Esophageal obstruction: Secondary | ICD-10-CM

## 2013-07-05 DIAGNOSIS — IMO0002 Reserved for concepts with insufficient information to code with codable children: Secondary | ICD-10-CM | POA: Diagnosis present

## 2013-07-05 DIAGNOSIS — D126 Benign neoplasm of colon, unspecified: Secondary | ICD-10-CM

## 2013-07-05 DIAGNOSIS — E8809 Other disorders of plasma-protein metabolism, not elsewhere classified: Secondary | ICD-10-CM | POA: Diagnosis present

## 2013-07-05 DIAGNOSIS — G8929 Other chronic pain: Secondary | ICD-10-CM | POA: Diagnosis present

## 2013-07-05 DIAGNOSIS — I2510 Atherosclerotic heart disease of native coronary artery without angina pectoris: Secondary | ICD-10-CM

## 2013-07-05 DIAGNOSIS — J449 Chronic obstructive pulmonary disease, unspecified: Secondary | ICD-10-CM | POA: Diagnosis present

## 2013-07-05 DIAGNOSIS — J4489 Other specified chronic obstructive pulmonary disease: Secondary | ICD-10-CM | POA: Diagnosis present

## 2013-07-05 DIAGNOSIS — E876 Hypokalemia: Secondary | ICD-10-CM

## 2013-07-05 DIAGNOSIS — E860 Dehydration: Secondary | ICD-10-CM

## 2013-07-05 DIAGNOSIS — R531 Weakness: Secondary | ICD-10-CM

## 2013-07-05 DIAGNOSIS — E43 Unspecified severe protein-calorie malnutrition: Secondary | ICD-10-CM

## 2013-07-05 DIAGNOSIS — R1310 Dysphagia, unspecified: Secondary | ICD-10-CM

## 2013-07-05 DIAGNOSIS — I959 Hypotension, unspecified: Secondary | ICD-10-CM | POA: Diagnosis present

## 2013-07-05 HISTORY — PX: ESOPHAGOGASTRODUODENOSCOPY: SHX5428

## 2013-07-05 LAB — URINALYSIS, ROUTINE W REFLEX MICROSCOPIC
BILIRUBIN URINE: NEGATIVE
GLUCOSE, UA: NEGATIVE mg/dL
HGB URINE DIPSTICK: NEGATIVE
Leukocytes, UA: NEGATIVE
Nitrite: NEGATIVE
PH: 6 (ref 5.0–8.0)
Protein, ur: NEGATIVE mg/dL
SPECIFIC GRAVITY, URINE: 1.025 (ref 1.005–1.030)
UROBILINOGEN UA: 0.2 mg/dL (ref 0.0–1.0)

## 2013-07-05 LAB — COMPREHENSIVE METABOLIC PANEL
ALT: 11 U/L (ref 0–35)
AST: 16 U/L (ref 0–37)
Albumin: 2.9 g/dL — ABNORMAL LOW (ref 3.5–5.2)
Alkaline Phosphatase: 46 U/L (ref 39–117)
BUN: 22 mg/dL (ref 6–23)
CO2: 23 mEq/L (ref 19–32)
Calcium: 8.6 mg/dL (ref 8.4–10.5)
Chloride: 108 mEq/L (ref 96–112)
Creatinine, Ser: 0.71 mg/dL (ref 0.50–1.10)
GFR calc non Af Amer: 77 mL/min — ABNORMAL LOW (ref >90)
GFR, EST AFRICAN AMERICAN: 89 mL/min — AB (ref >90)
Glucose, Bld: 74 mg/dL (ref 70–99)
Potassium: 3.3 mEq/L — ABNORMAL LOW (ref 3.7–5.3)
SODIUM: 145 meq/L (ref 137–147)
TOTAL PROTEIN: 6.1 g/dL (ref 6.0–8.3)
Total Bilirubin: 0.6 mg/dL (ref 0.3–1.2)

## 2013-07-05 LAB — CBC WITH DIFFERENTIAL/PLATELET
Basophils Absolute: 0 10*3/uL (ref 0.0–0.1)
Basophils Relative: 0 % (ref 0–1)
Eosinophils Absolute: 0.1 10*3/uL (ref 0.0–0.7)
Eosinophils Relative: 1 % (ref 0–5)
HEMATOCRIT: 32.6 % — AB (ref 36.0–46.0)
HEMOGLOBIN: 10.4 g/dL — AB (ref 12.0–15.0)
LYMPHS ABS: 3.6 10*3/uL (ref 0.7–4.0)
Lymphocytes Relative: 27 % (ref 12–46)
MCH: 31.5 pg (ref 26.0–34.0)
MCHC: 31.9 g/dL (ref 30.0–36.0)
MCV: 98.8 fL (ref 78.0–100.0)
MONO ABS: 1 10*3/uL (ref 0.1–1.0)
MONOS PCT: 7 % (ref 3–12)
NEUTROS ABS: 8.5 10*3/uL — AB (ref 1.7–7.7)
Neutrophils Relative %: 65 % (ref 43–77)
Platelets: 256 10*3/uL (ref 150–400)
RBC: 3.3 MIL/uL — ABNORMAL LOW (ref 3.87–5.11)
RDW: 15.5 % (ref 11.5–15.5)
WBC: 13.1 10*3/uL — ABNORMAL HIGH (ref 4.0–10.5)

## 2013-07-05 LAB — PROTIME-INR
INR: 1.02 (ref 0.00–1.49)
PROTHROMBIN TIME: 13.2 s (ref 11.6–15.2)

## 2013-07-05 LAB — TROPONIN I: Troponin I: <0.3 ng/mL (ref <0.30)

## 2013-07-05 SURGERY — EGD (ESOPHAGOGASTRODUODENOSCOPY)
Anesthesia: Moderate Sedation

## 2013-07-05 MED ORDER — POTASSIUM CHLORIDE 10 MEQ/100ML IV SOLN: 10.0000 meq | INTRAVENOUS | Status: DC

## 2013-07-05 MED ORDER — LEVOTHYROXINE SODIUM 100 MCG IV SOLR
37.5000 ug | Freq: Every day | INTRAVENOUS | Status: DC
  Administered 2013-07-06: 37.5 ug via INTRAVENOUS
  Filled 2013-07-05 (×3): qty 5

## 2013-07-05 MED ORDER — THIAMINE HCL 100 MG/ML IJ SOLN
100.0000 mg | Freq: Every day | INTRAMUSCULAR | Status: DC
  Administered 2013-07-05 – 2013-07-06 (×2): 100 mg via INTRAVENOUS
  Filled 2013-07-05 (×2): qty 2

## 2013-07-05 MED ORDER — MIDAZOLAM HCL 5 MG/5ML IJ SOLN
INTRAMUSCULAR | Status: DC | PRN
  Administered 2013-07-05: 2 mg via INTRAVENOUS
  Administered 2013-07-05: 1 mg via INTRAVENOUS

## 2013-07-05 MED ORDER — STERILE WATER FOR IRRIGATION IR SOLN
Status: DC | PRN
  Administered 2013-07-05: 18:00:00

## 2013-07-05 MED ORDER — POTASSIUM CHLORIDE 10 MEQ/100ML IV SOLN
10.0000 meq | INTRAVENOUS | Status: AC
  Administered 2013-07-05 (×2): 10 meq via INTRAVENOUS
  Filled 2013-07-05: qty 100

## 2013-07-05 MED ORDER — POTASSIUM CHLORIDE 10 MEQ/100ML IV SOLN
10.0000 meq | Freq: Once | INTRAVENOUS | Status: DC
Start: 2013-07-05 — End: 2013-07-05

## 2013-07-05 MED ORDER — METHYLPREDNISOLONE SODIUM SUCC 40 MG IJ SOLR
20.0000 mg | INTRAMUSCULAR | Status: DC
  Administered 2013-07-05: 20 mg via INTRAVENOUS
  Filled 2013-07-05: qty 1

## 2013-07-05 MED ORDER — KCL IN DEXTROSE-NACL 40-5-0.9 MEQ/L-%-% IV SOLN
INTRAVENOUS | Status: DC
  Administered 2013-07-05: 19:00:00 via INTRAVENOUS
  Filled 2013-07-05 (×3): qty 1000

## 2013-07-05 MED ORDER — SODIUM CHLORIDE 0.9 % IV BOLUS (SEPSIS)
250.0000 mL | Freq: Once | INTRAVENOUS | Status: AC
  Administered 2013-07-05: 250 mL via INTRAVENOUS

## 2013-07-05 MED ORDER — SODIUM CHLORIDE 0.9 % IV SOLN: INTRAVENOUS | Status: DC

## 2013-07-05 MED ORDER — PANTOPRAZOLE SODIUM 40 MG IV SOLR
40.0000 mg | INTRAVENOUS | Status: DC
  Administered 2013-07-05: 40 mg via INTRAVENOUS
  Filled 2013-07-05: qty 40

## 2013-07-05 MED ORDER — MIDAZOLAM HCL 5 MG/5ML IJ SOLN
INTRAMUSCULAR | Status: AC
  Filled 2013-07-05: qty 10

## 2013-07-05 MED ORDER — FOLIC ACID 5 MG/ML IJ SOLN
1.0000 mg | Freq: Every day | INTRAMUSCULAR | Status: DC
  Administered 2013-07-05: 1 mg via INTRAVENOUS
  Filled 2013-07-05 (×4): qty 0.2

## 2013-07-05 MED ORDER — MORPHINE SULFATE 2 MG/ML IJ SOLN: 1.0000 mg | INTRAMUSCULAR | Status: DC | PRN

## 2013-07-05 MED ORDER — THIAMINE HCL 100 MG/ML IJ SOLN: 100.0000 mg | Freq: Every day | INTRAMUSCULAR | Status: DC

## 2013-07-05 MED ORDER — FOLIC ACID 5 MG/ML IJ SOLN
INTRAMUSCULAR | Status: AC
  Filled 2013-07-05: qty 0.2

## 2013-07-05 MED ORDER — METHYLPREDNISOLONE SODIUM SUCC 40 MG IJ SOLR: 20.0000 mg | INTRAMUSCULAR | Status: DC

## 2013-07-05 MED ORDER — BUTAMBEN-TETRACAINE-BENZOCAINE 2-2-14 % EX AERO
INHALATION_SPRAY | CUTANEOUS | Status: DC | PRN
  Administered 2013-07-05: 2 via TOPICAL

## 2013-07-05 MED ORDER — FOLIC ACID 5 MG/ML IJ SOLN
1.0000 mg | Freq: Every day | INTRAMUSCULAR | Status: DC
  Filled 2013-07-05 (×2): qty 0.2

## 2013-07-05 MED ORDER — ONDANSETRON HCL 4 MG/2ML IJ SOLN
4.0000 mg | Freq: Once | INTRAMUSCULAR | Status: AC
  Administered 2013-07-05: 4 mg via INTRAVENOUS
  Filled 2013-07-05: qty 2

## 2013-07-05 MED ORDER — PANTOPRAZOLE SODIUM 40 MG IV SOLR: 40.0000 mg | INTRAVENOUS | Status: DC

## 2013-07-05 MED ORDER — MEPERIDINE HCL 50 MG/ML IJ SOLN
INTRAMUSCULAR | Status: DC | PRN
  Administered 2013-07-05: 25 mg via INTRAVENOUS

## 2013-07-05 MED ORDER — SODIUM CHLORIDE 0.9 % IV SOLN
INTRAVENOUS | Status: DC
  Administered 2013-07-05: 17:00:00 via INTRAVENOUS

## 2013-07-05 MED ORDER — MEPERIDINE HCL 50 MG/ML IJ SOLN
INTRAMUSCULAR | Status: AC
  Filled 2013-07-05: qty 1

## 2013-07-05 NOTE — ED Notes (Signed)
Report given to Victoria, RN.

## 2013-07-05 NOTE — H&P (Signed)
ADMISSION H&P  PCP:   Sallee Lange, MD   Chief Complaint:  Difficulty swallowing   HPI: Very pleasant 80yo female w/ a hx of prior esophageal dilatations who presented with a 24hr hx of inability to swallow even her own saliva. Her last dilatation appears to have been Dec 2014 (though family states was done in Feb 2015 as well).  She reports that she has noted very slowly worsening difficulty swallowing for some weeks now, but as early as yesterday morning was able to eat and entire bowl of cheerios without any difficulty.  For lunch yesterday she attempted to eat a chicken breast that she states was "very dry."  It was following this meal that she developed the acute onset of the inability to swallow even liquids.  She has not eaten or drank anything since that time.  She has not been able to take her medications. She specifically states that she is not sob, nor has she experienced wheezing/stridor.  Review of Systems:  The patient denies anorexia, fever, weight loss, vision loss, decreased hearing, hoarseness, chest pain, syncope, dyspnea on exertion, peripheral edema, balance deficits, hemoptysis, abdominal pain, melena, hematochezia, hematuria, incontinence, muscle weakness, transient blindness, difficulty walking, depression, unusual weight change, abnormal bleeding, enlarged lymph nodes, or angioedema.  Past Medical History: Past Medical History  Diagnosis Date  . Anemia     followed by Dr.Neijstrom  . Hyperlipidemia   . Hypothyroidism   . History of atherosclerotic cardiovascular disease   . History of colonoscopy     02/2002 showed few diverticula at sigmoid colon otherwise normal colonoscopy and terminal ileoscopy 1995 several colon polyps ,tubular adenoma  . GERD (gastroesophageal reflux disease)     chronic  . GERD (gastroesophageal reflux disease)     chronic gerd  . Tiredness   . T9 vertebral fracture 02/09/13    result of fall at home  . Adrenal insufficiency 03/23/2013    . Orthostatic hypotension 03/21/2013  . Pacemaker   . Heart murmur     "years ago"  . COPD (chronic obstructive pulmonary disease)   . Pneumonia   . Depression     takes Zoloft  . HYPERLIPIDEMIA 10/24/2008    Qualifier: Diagnosis of  By: Dwaine Gale, CNA, [place]    . C. difficile colitis 06/21/2013   Past Surgical History  Procedure Laterality Date  . Insert / replace / remove pacemaker  01/17/2009    [employer]Jude Cardiac Pacemaker   . Cardiac catheterization  1998  . Cholecystectomy    . 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 Esophagogastroduodenoscopy (egd) with esophageal dilation  03/22/2012    Procedure: ESOPHAGOGASTRODUODENOSCOPY (EGD) WITH ESOPHAGEAL DILATION;  Surgeon: Rogene Houston, MD;  [facility];  Service: Endoscopy;  Laterality: N/A;  730  . Colonoscopy N/A 12/29/2012    Procedure: COLONOSCOPY;  Surgeon: Rogene Houston, MD;  Location: [facility];  Service: Endoscopy;  Laterality: N/A;  130  . Esophagogastroduodenoscopy (egd) with esophageal dilation N/A 02/12/2013    Procedure: ESOPHAGOGASTRODUODENOSCOPY (EGD) WITH ESOPHAGEAL DILATION;  Surgeon: Rogene Houston, MD;  [facility];  Service: Endoscopy;  Laterality: N/A;  . Breast surgery      lumpectomy - noncancerous  . Eye surgery Bilateral     cataract surgery   . Clavicle surgery Left   . Vaginal hysterectomy    . Kyphoplasty N/A 06/16/2013    Procedure: Thoracic Nine Kyphoplasty;  Surgeon: Ophelia Charter, MD;  Location: [facility];  Service: Neurosurgery;  Laterality: N/A;  Thoracic Nine Kyphoplasty  . Back surgery      Medications: Prior to Admission medications   Medication Sig Start Date End Date Taking? Authorizing Provider  acetaminophen (TYLENOL) 500 MG tablet Take 1,000 mg by mouth every 6 (six) hours as needed. Pain   Yes Historical Provider, MD  atorvastatin (LIPITOR) 10 MG tablet Take 1 tablet (10 mg total) by mouth daily at 6 PM. 06/21/13  Yes Radene Gunning, NP  Cholecalciferol (VITAMIN D) 400 UNITS capsule Take 400 Units by mouth daily.     Yes Historical Provider, MD  cyanocobalamin (,VITAMIN B-12,) 1000 MCG/ML injection Inject 1 mL (1,000 mcg total) into the muscle every 30 (thirty) days. 04/19/13  Yes Kathyrn Drown, MD  fludrocortisone (FLORINEF) 0.1 MG tablet Take 0.1 mg by mouth daily.   Yes Historical Provider, MD  folic acid (FOLVITE) 1 MG tablet Take 1 tablet (1 mg total) by mouth daily. 06/21/13  Yes Radene Gunning, NP  levothyroxine (SYNTHROID, LEVOTHROID) 75 MCG tablet Take 1 tablet (75 mcg total) by mouth daily before breakfast. 04/21/13  Yes Kathyrn Drown, MD  loperamide (IMODIUM A-D) 2 MG tablet Take 2 mg by mouth daily as needed for diarrhea or loose stools.   Yes Historical Provider, MD  megestrol (MEGACE) 20 MG tablet Take 20 mg by mouth daily.   Yes Historical Provider, MD  metroNIDAZOLE (FLAGYL) 500 MG tablet Take 1 tablet (500 mg total) by mouth 3 (three) times daily. 06/21/13  Yes Lezlie Octave Black, NP  omeprazole (PRILOSEC) 20 MG capsule Take 1 capsule (20 mg total) by mouth daily. 04/26/13  Yes Kathyrn Drown, MD  oxyCODONE-acetaminophen (PERCOCET/ROXICET) 5-325 MG per tablet Take 1-2 tablets by mouth every 4 (four) hours as needed for moderate pain. 06/17/13  Yes Ophelia Charter, MD  predniSONE (DELTASONE) 10 MG tablet Take 1 tablet (10 mg total) by mouth daily. For treatment of adrenal insufficiency. 03/25/13  Yes Rexene Alberts, MD  sertraline (ZOLOFT) 25 MG tablet Take 1 tablet (25 mg total) by mouth daily. 04/01/13  Yes Kathyrn Drown, MD  thiamine 100 MG tablet Take 1 tablet (100 mg total) by mouth daily. 06/21/13  Yes Radene Gunning, NP   Allergies:   Allergies  Allergen Reactions  . Penicillins Rash    Social History:  reports that she quit smoking about 41 years ago. She has never used smokeless tobacco. She reports that she does not drink alcohol or use illicit drugs. She live in [place] with her sister.  She is able to  ambulate independently.  She is modestly hard of hearing.    Family History: Noncontributory in this 80yo patient   Physical Exam: Filed Vitals:   07/05/13 1007 07/05/13 1013 07/05/13 1224  BP:  140/72 168/61  Pulse: 78  72  Temp: 98 F (36.7 C)    TempSrc: Oral    Resp: 20  15  Height: 5\' 3"  (1.6 m)    Weight: 40.37 kg (89 lb)    SpO2: 98%  95%   General: No acute respiratory distress HEENT: Normocephalic,atraumatic, pupils equal round reactive to light and accommodation, extraocular muscles intact bilaterally, OC/OP clear, sclera nonicteric Neck: No JVD or thyromegaly Lungs: Clear to auscultation bilaterally without wheezes or rhonchi, with good air movement throughout all fields Cardiovascular: Regular rate and rhythm without murmur gallop or rub normal S1 and S2 Abdomen: Nontender, nondistended, soft, bowel sounds present, no  organomegaly, no rebound, no ascites Extremities: No significant cyanosis, clubbing, edema bilateral lower extremities Neurologic: Alert and oriented x4, cranial nerves II through XII intact bilaterally, 5 over 5 strength bilateral upper and lower studies, no Babinski, intact to sensation of touch throughout  Labs on Admission:   Recent Labs  07/05/13 1140  NA 145  K 3.3*  CL 108  CO2 23  GLUCOSE 74  BUN 22  CREATININE 0.71  CALCIUM 8.6    Recent Labs  07/05/13 1140  AST 16  ALT 11  ALKPHOS 46  BILITOT 0.6  PROT 6.1  ALBUMIN 2.9*    Recent Labs  07/05/13 1140  WBC 13.1*  NEUTROABS 8.5*  HGB 10.4*  HCT 32.6*  MCV 98.8  PLT 256    Recent Labs  07/05/13 1140  TROPONINI <0.30    Radiological Exams on Admission: Dg Chest 2 View  07/05/2013   IMPRESSION: Chronic apical lung disease with architectural distortion, including some traction on the trachea. Perhaps this might affect the upper thoracic esophagus as well. No acute cardiopulmonary abnormality.   Electronically Signed   By: Lars Pinks M.D.   On: 07/05/2013 12:29    Assessment/Plan  Food impaction v/s progression of chronic recurrent esophageal stricture Keep npo - her GI MD has been consulted per the EDP - will need EGD to evaluate - is hemodynamically stable with no sx to suggest impending airway compromise or massive distention of esophagus at this time   Mild hypokalemia replace IV - check Mg - follow trend  Recent admit for acute diverticulitis  Was d/c on 06/21/2013  C diff + Noted during recent admit - ?simply colonized - no evidence to suggest ongoing infection at present - follow for diarrhea   Chronic anemia Hgb stable   Hypothyroidism Synthroid via IV until able to tolerate orals  Adrenal insuff w/ hx orthostatic hypotension  IV steroid until able to take orals   Hyperlipidemia Resume meds when able to take po  Chronic back pain s/p recent T9 kyphoplasty  Time spent on this patient including examination and decision-making process: 70+ minutes.  Cherene Altes, MD [facility] For Consults/Admissions - Flow Manager - [phone] Office  [phone] Pager [phone]  On-Call/Text Page:      [email or web address]      password [facility]  07/05/2013, 2:17 PM

## 2013-07-05 NOTE — ED Notes (Signed)
Difficulty swallowing food/fluids since eating lunch yesterday.  Denies pain, denies difficulty breathing.

## 2013-07-05 NOTE — Care Management Utilization Note (Signed)
UR completed 

## 2013-07-05 NOTE — Progress Notes (Signed)
Patient up from ED with saline locked IV on right forearm that was not charted.  Fluids started and IV infiltrated. IV removed.

## 2013-07-05 NOTE — Telephone Encounter (Signed)
Patient called and stated that she is not able to swallow anything at all, not even a small sip of water. Consulted with Dr. Nicki Reaper and advised patient that she needs to go to the ER immediately for treatment due to possible obstruction. Patient agreed and verbalized understanding.

## 2013-07-05 NOTE — ED Provider Notes (Signed)
CSN: [id number]     Arrival date & time 07/05/13  1829 History  This chart was scribed for Mervin Kung, MD by Ladene Artist, ED Scribe. The patient was seen in room APA01/APA01. Patient's care was started at 10:44 AM.   Chief Complaint  Patient presents with  . Dysphagia    Patient is a 80 y.o. female presenting with GERD. The history is provided by the patient. No language interpreter was used.  Gastrophageal Reflux This is a recurrent problem. The current episode started yesterday. The problem occurs constantly. The problem has been gradually worsening. Associated symptoms include chest pain (mild). Pertinent negatives include no abdominal pain, no headaches and no shortness of breath. The symptoms are aggravated by swallowing. Nothing relieves the symptoms.   HPI Comments: MARETTA OVERDORF is a 80 y.o. female who presents to the Emergency Department complaining of difficulty swallowing onset yesterday during lunch. Pt states that she tried to swallowing her pills twice but reports them coming back up. She has also tried to swallow Ensure this morning. Pt has not tried water. She reports excessive spitting and green/clear phlegm. Pt has had similar problems in the past and has had esophagus stretched twice by Dr. Melony Overly at [facility]. Last procedure was December 2014/January 2015. Pt is not currently on blood thinners but she does bruise easily.   PCP: Sallee Lange  Past Medical History  Diagnosis Date  . Anemia     followed by Dr.Neijstrom  . Hyperlipidemia   . Hypothyroidism   . History of atherosclerotic cardiovascular disease   . History of colonoscopy     02/2002 showed few diverticula at sigmoid colon otherwise normal colonoscopy and terminal ileoscopy 1995 several colon polyps ,tubular adenoma  . GERD (gastroesophageal reflux disease)     chronic  . GERD (gastroesophageal reflux disease)     chronic gerd  . Tiredness   . T9 vertebral fracture 02/09/13    result of fall  at home  . Adrenal insufficiency 03/23/2013  . Orthostatic hypotension 03/21/2013  . Pacemaker   . Heart murmur     "years ago"  . COPD (chronic obstructive pulmonary disease)   . Pneumonia   . Depression     takes Zoloft  . HYPERLIPIDEMIA 10/24/2008    Qualifier: Diagnosis of  By: Dwaine Gale, CNA, [place]    . C. difficile colitis 06/21/2013   Past Surgical History  Procedure Laterality Date  . Insert / replace / remove pacemaker  01/17/2009    [employer].Jude Cardiac Pacemaker   . Cardiac catheterization  1998  . Cholecystectomy    . 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 Esophagogastroduodenoscopy (egd) with esophageal dilation  03/22/2012    Procedure: ESOPHAGOGASTRODUODENOSCOPY (EGD) WITH ESOPHAGEAL DILATION;  Surgeon: Rogene Houston, MD;  [facility];  Service: Endoscopy;  Laterality: N/A;  730  . Colonoscopy N/A 12/29/2012    Procedure: COLONOSCOPY;  Surgeon: Rogene Houston, MD;  [facility];  Service: Endoscopy;  Laterality: N/A;  130  . Esophagogastroduodenoscopy (egd) with esophageal dilation N/A 02/12/2013    Procedure: ESOPHAGOGASTRODUODENOSCOPY (EGD) WITH ESOPHAGEAL DILATION;  Surgeon: Rogene Houston, MD;  [facility];  Service: Endoscopy;  Laterality: N/A;  . Breast surgery      lumpectomy - noncancerous  . Eye surgery Bilateral     cataract surgery   . Clavicle surgery Left   . Vaginal hysterectomy    . Kyphoplasty N/A  06/16/2013    Procedure: Thoracic Nine Kyphoplasty;  Surgeon: Ophelia Charter, MD;  Location: [facility];  Service: Neurosurgery;  Laterality: N/A;  Thoracic Nine Kyphoplasty  . Back surgery     No family history on file. History  Substance Use Topics  . Smoking status: Former Smoker -- 1.00 packs/day for 40 years    Quit date: 08/25/1971  . Smokeless tobacco: Never Used  . Alcohol Use: No   OB History   Grav Para Term Preterm Abortions TAB SAB Ect Mult Living                 Review of  Systems  Constitutional: Negative for fever and chills.  HENT: Positive for trouble swallowing. Negative for rhinorrhea and sore throat.   Eyes: Negative for visual disturbance.  Respiratory: Negative for cough and shortness of breath.   Cardiovascular: Positive for chest pain (mild). Negative for leg swelling.  Gastrointestinal: Negative for vomiting, abdominal pain and diarrhea.  Genitourinary: Negative for dysuria.  Musculoskeletal: Positive for back pain. Negative for neck pain.  Skin: Negative for rash.  Neurological: Negative for headaches.  Hematological: Bruises/bleeds easily.  Psychiatric/Behavioral: Negative for confusion.  All other systems reviewed and are negative.  Allergies  Penicillins  Home Medications   Prior to Admission medications   Medication Sig Start Date End Date Taking? Authorizing Provider  acetaminophen (TYLENOL) 500 MG tablet Take 1,000 mg by mouth every 6 (six) hours as needed. Pain   Yes Historical Provider, MD  atorvastatin (LIPITOR) 10 MG tablet Take 1 tablet (10 mg total) by mouth daily at 6 PM. 06/21/13  Yes Radene Gunning, NP  Cholecalciferol (VITAMIN D) 400 UNITS capsule Take 400 Units by mouth daily.     Yes Historical Provider, MD  cyanocobalamin (,VITAMIN B-12,) 1000 MCG/ML injection Inject 1 mL (1,000 mcg total) into the muscle every 30 (thirty) days. 04/19/13  Yes Kathyrn Drown, MD  fludrocortisone (FLORINEF) 0.1 MG tablet Take 0.1 mg by mouth daily.   Yes Historical Provider, MD  folic acid (FOLVITE) 1 MG tablet Take 1 tablet (1 mg total) by mouth daily. 06/21/13  Yes Radene Gunning, NP  levothyroxine (SYNTHROID, LEVOTHROID) 75 MCG tablet Take 1 tablet (75 mcg total) by mouth daily before breakfast. 04/21/13  Yes Kathyrn Drown, MD  loperamide (IMODIUM A-D) 2 MG tablet Take 2 mg by mouth daily as needed for diarrhea or loose stools.   Yes Historical Provider, MD  megestrol (MEGACE) 20 MG tablet Take 20 mg by mouth daily.   Yes Historical Provider, MD   metroNIDAZOLE (FLAGYL) 500 MG tablet Take 1 tablet (500 mg total) by mouth 3 (three) times daily. 06/21/13  Yes Lezlie Octave Black, NP  omeprazole (PRILOSEC) 20 MG capsule Take 1 capsule (20 mg total) by mouth daily. 04/26/13  Yes Kathyrn Drown, MD  oxyCODONE-acetaminophen (PERCOCET/ROXICET) 5-325 MG per tablet Take 1-2 tablets by mouth every 4 (four) hours as needed for moderate pain. 06/17/13  Yes Ophelia Charter, MD  predniSONE (DELTASONE) 10 MG tablet Take 1 tablet (10 mg total) by mouth daily. For treatment of adrenal insufficiency. 03/25/13  Yes Rexene Alberts, MD  sertraline (ZOLOFT) 25 MG tablet Take 1 tablet (25 mg total) by mouth daily. 04/01/13  Yes Kathyrn Drown, MD  thiamine 100 MG tablet Take 1 tablet (100 mg total) by mouth daily. 06/21/13  Yes Radene Gunning, NP   Triage Vitals: BP 140/72  Pulse 78  Temp(Src) 98 F (36.7  C) (Oral)  Resp 20  Ht 5\' 3"  (1.6 m)  Wt 89 lb (40.37 kg)  BMI 15.77 kg/m2  SpO2 98% Physical Exam  Nursing note and vitals reviewed. Constitutional: She is oriented to person, place, and time. She appears well-developed and well-nourished. No distress.  HENT:  Head: Normocephalic and atraumatic.  Mouth/Throat: Oropharynx is clear and moist and mucous membranes are normal.  Eyes: EOM are normal.  Neck: Neck supple.  Cardiovascular: Normal rate and regular rhythm.   Pulmonary/Chest: Effort normal and breath sounds normal. No respiratory distress.  Abdominal: Bowel sounds are normal. There is no tenderness.  Musculoskeletal: Normal range of motion. She exhibits no edema.  Neurological: She is alert and oriented to person, place, and time. No cranial nerve deficit. She exhibits normal muscle tone. Coordination normal.  Skin: Skin is warm and dry.  Psychiatric: She has a normal mood and affect. Her behavior is normal.    ED Course  Procedures (including critical care time) DIAGNOSTIC STUDIES: Oxygen Saturation is 98% on RA, normal by my interpretation.     COORDINATION OF CARE: 11:01 AM-Discussed treatment plan which includes IV fluids and CXR with pt at bedside and pt agreed to plan.   Labs Review Labs Reviewed  COMPREHENSIVE METABOLIC PANEL - Abnormal; Notable for the following:    Potassium 3.3 (*)    Albumin 2.9 (*)    GFR calc non Af Amer 77 (*)    GFR calc Af Amer 89 (*)    All other components within normal limits  CBC WITH DIFFERENTIAL - Abnormal; Notable for the following:    WBC 13.1 (*)    RBC 3.30 (*)    Hemoglobin 10.4 (*)    HCT 32.6 (*)    Neutro Abs 8.5 (*)    All other components within normal limits  URINALYSIS, ROUTINE W REFLEX MICROSCOPIC - Abnormal; Notable for the following:    Ketones, ur TRACE (*)    All other components within normal limits  TROPONIN I   Results for orders placed during the hospital encounter of 07/05/13  COMPREHENSIVE METABOLIC PANEL      Result Value Ref Range   Sodium 145  137 - 147 mEq/L   Potassium 3.3 (*) 3.7 - 5.3 mEq/L   Chloride 108  96 - 112 mEq/L   CO2 23  19 - 32 mEq/L   Glucose, Bld 74  70 - 99 mg/dL   BUN 22  6 - 23 mg/dL   Creatinine, Ser 0.71  0.50 - 1.10 mg/dL   Calcium 8.6  8.4 - 10.5 mg/dL   Total Protein 6.1  6.0 - 8.3 g/dL   Albumin 2.9 (*) 3.5 - 5.2 g/dL   AST 16  0 - 37 U/L   ALT 11  0 - 35 U/L   Alkaline Phosphatase 46  39 - 117 U/L   Total Bilirubin 0.6  0.3 - 1.2 mg/dL   GFR calc non Af Amer 77 (*) >90 mL/min   GFR calc Af Amer 89 (*) >90 mL/min  CBC WITH DIFFERENTIAL      Result Value Ref Range   WBC 13.1 (*) 4.0 - 10.5 K/uL   RBC 3.30 (*) 3.87 - 5.11 MIL/uL   Hemoglobin 10.4 (*) 12.0 - 15.0 g/dL   HCT 32.6 (*) 36.0 - 46.0 %   MCV 98.8  78.0 - 100.0 fL   MCH 31.5  26.0 - 34.0 pg   MCHC 31.9  30.0 - 36.0 g/dL   RDW  15.5  11.5 - 15.5 %   Platelets 256  150 - 400 K/uL   Neutrophils Relative % 65  43 - 77 %   Neutro Abs 8.5 (*) 1.7 - 7.7 K/uL   Lymphocytes Relative 27  12 - 46 %   Lymphs Abs 3.6  0.7 - 4.0 K/uL   Monocytes Relative 7  3 - 12 %    Monocytes Absolute 1.0  0.1 - 1.0 K/uL   Eosinophils Relative 1  0 - 5 %   Eosinophils Absolute 0.1  0.0 - 0.7 K/uL   Basophils Relative 0  0 - 1 %   Basophils Absolute 0.0  0.0 - 0.1 K/uL  URINALYSIS, ROUTINE W REFLEX MICROSCOPIC      Result Value Ref Range   Color, Urine YELLOW  YELLOW   APPearance CLEAR  CLEAR   Specific Gravity, Urine 1.025  1.005 - 1.030   pH 6.0  5.0 - 8.0   Glucose, UA NEGATIVE  NEGATIVE mg/dL   Hgb urine dipstick NEGATIVE  NEGATIVE   Bilirubin Urine NEGATIVE  NEGATIVE   Ketones, ur TRACE (*) NEGATIVE mg/dL   Protein, ur NEGATIVE  NEGATIVE mg/dL   Urobilinogen, UA 0.2  0.0 - 1.0 mg/dL   Nitrite NEGATIVE  NEGATIVE   Leukocytes, UA NEGATIVE  NEGATIVE  TROPONIN I      Result Value Ref Range   Troponin I <0.30  <0.30 ng/mL     Imaging Review Dg Chest 2 View  07/05/2013   CLINICAL DATA:  80 year old female with difficulty swallowing. Initial encounter.  EXAM: CHEST  2 VIEW  COMPARISON:  06/28/2013 and earlier.  FINDINGS: Biapical chronic lung opacity with architectural distortion. Associated hilar retraction and chronic distortion of the tracheal contour. Stable lung volumes. Stable cardiac size and mediastinal contours. Chronic left chest cardiac pacemaker. No pneumothorax or pulmonary edema. No pleural effusion or acute pulmonary opacity. Lower thoracic compression fracture status post augmentation again noted. Osteopenia. No acute osseous abnormality identified.  IMPRESSION: Chronic apical lung disease with architectural distortion, including some traction on the trachea. Perhaps this might affect the upper thoracic esophagus as well. No acute cardiopulmonary abnormality.   Electronically Signed   By: Lars Pinks M.D.   On: 07/05/2013 12:29     EKG Interpretation   Date/Time:  Tuesday Jul 05 2013 11:38:26 EDT Ventricular Rate:  66 PR Interval:  136 QRS Duration: 74 QT Interval:  386 QTC Calculation: 404 R Axis:   87 Text Interpretation:  Sinus rhythm  Borderline right axis deviation  Baseline wander in lead(s) I II aVR Confirmed by Aulden Calise  MD, William Schake  ([id number]) on 07/05/2013 11:43:26 AM      MDM   Final diagnoses:  Dysphagia, unspecified(787.20)    Patient with a history of esophageal strictures. Patient started having trouble with swallowing food and fluids since she lunch time yesterday. Really doing anything for dinner. Occasionally has to spell plus live at other times seemed like is going down. Symptoms persisted today. Patient has had dilatation for esophageal strictures in the past most recently it was December January. Patient is known to have a torturous esophagus with some motility problems. Patient will be admitted by the hospitalist service her GI doctors informed and will consult. Aside about upper endoscopy and additional dilatation. Cannot rule out that food is not impacted in the esophagus.   I personally performed the services described in this documentation, which was scribed in my presence. The recorded information has been reviewed and is  accurate.    Mervin Kung, MD 07/05/13 1420

## 2013-07-05 NOTE — ED Notes (Signed)
Patient states she thinks food is stuck in her throat and that it has been difficult to swallow x 2 days. H/o strictures and "tunneling" in esophagus. Followed by Dr Laural Golden.

## 2013-07-05 NOTE — Consult Note (Signed)
Reason for Consult:dysphagia Referring Physician: Hospitalist  Kaitlyn Cooper is an 80 y.o. female.  HPI: Admitted thru the ED. Her sister tells me she has not been able to swallow her foods since yesterday.  She ate chicken yesterday. Afterwards, she was unable to even swallow water.  Her daughter states she cannot swallow water at this point. While in room, patient has tissue has is spitting saliva in the tissue. Hx of esophageal stricture.   She has had several EGD/ED in the past. Appetite is okay. She has lost about 7 pounds since we saw her in the office in January.  She does have some epigastric discomfort. Usually has a BM every other day. Patient usually cuts her food in small portions and chews well. If food does lodge, she can usually cough the bolus back up.  She is not on any blood thinners. Her last EGD was in December of 2014;Impression:  Small distal is a facial diverticula.  Esophageal stricture/spastic segment noted 5 cm proximal to GE junction. It was dilated to 16.5 mm resulting in mucosal disruption.  Noncritical ring at GE junction.  Moderate-sized sliding type hernia.  Gastric AV malformation with active bleeding/oozing controlled with argon plasma coagulation.   03/03/2013 DG Esophagram: Tiny Zenker's diverticulum.  Esophageal dysmotility with diffuse esophageal spasm.  Numerous mid esophageal diverticuli.  Large hiatal hernia.  A 13 mm barium pill would not pass through the esophagus.  No change from recent prior study.    No  Blood thinners.    Past Medical History  Diagnosis Date  . Anemia     followed by Dr.Neijstrom  . Hyperlipidemia   . Hypothyroidism   . History of atherosclerotic cardiovascular disease   . History of colonoscopy     02/2002 showed few diverticula at sigmoid colon otherwise normal colonoscopy and terminal ileoscopy 1995 several colon polyps ,tubular adenoma  . GERD (gastroesophageal reflux disease)     chronic  . GERD  (gastroesophageal reflux disease)     chronic gerd  . Tiredness   . T9 vertebral fracture 02/09/13    result of fall at home  . Adrenal insufficiency 03/23/2013  . Orthostatic hypotension 03/21/2013  . Pacemaker   . Heart murmur     "years ago"  . COPD (chronic obstructive pulmonary disease)   . Pneumonia   . Depression     takes Zoloft  . HYPERLIPIDEMIA 10/24/2008    Qualifier: Diagnosis of  By: Dwaine Gale, CNA, [place]    . C. difficile colitis 06/21/2013    Past Surgical History  Procedure Laterality Date  . Insert / replace / remove pacemaker  01/17/2009    [employer] Cardiac Pacemaker   . Cardiac catheterization  1998  . Cholecystectomy    . 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 Esophagogastroduodenoscopy (egd) with esophageal dilation  03/22/2012    Procedure: ESOPHAGOGASTRODUODENOSCOPY (EGD) WITH ESOPHAGEAL DILATION;  Surgeon: Rogene Houston, MD;  Location: [facility];  Service: Endoscopy;  Laterality: N/A;  730  . Colonoscopy N/A 12/29/2012    Procedure: COLONOSCOPY;  Surgeon: Rogene Houston, MD;  Location: [facility];  Service: Endoscopy;  Laterality: N/A;  130  . Esophagogastroduodenoscopy (egd) with esophageal dilation N/A 02/12/2013    Procedure: ESOPHAGOGASTRODUODENOSCOPY (EGD) WITH ESOPHAGEAL DILATION;  Surgeon: Rogene Houston, MD;  Location: [facility];  Service: Endoscopy;  Laterality: N/A;  . Breast surgery      lumpectomy - noncancerous  .  Eye surgery Bilateral     cataract surgery   . Clavicle surgery Left   . Vaginal hysterectomy    . Kyphoplasty N/A 06/16/2013    Procedure: Thoracic Nine Kyphoplasty;  Surgeon: Ophelia Charter, MD;  Location: [facility] [facility];  Service: Neurosurgery;  Laterality: N/A;  Thoracic Nine Kyphoplasty  . Back surgery      No family history on file.  Social History:  reports that she quit smoking about 41 years ago. She has never used smokeless tobacco. She reports that she does not drink alcohol or use  illicit drugs.  Allergies:  Allergies  Allergen Reactions  . Penicillins Rash    Medications: I have reviewed the patient's current medications.  Results for orders placed during the hospital encounter of 07/05/13 (from the past 48 hour(s))  COMPREHENSIVE METABOLIC PANEL     Status: Abnormal   Collection Time    07/05/13 11:40 AM      Result Value Ref Range   Sodium 145  137 - 147 mEq/L   Potassium 3.3 (*) 3.7 - 5.3 mEq/L   Chloride 108  96 - 112 mEq/L   CO2 23  19 - 32 mEq/L   Glucose, Bld 74  70 - 99 mg/dL   BUN 22  6 - 23 mg/dL   Creatinine, Ser 0.71  0.50 - 1.10 mg/dL   Calcium 8.6  8.4 - 10.5 mg/dL   Total Protein 6.1  6.0 - 8.3 g/dL   Albumin 2.9 (*) 3.5 - 5.2 g/dL   AST 16  0 - 37 U/L   ALT 11  0 - 35 U/L   Alkaline Phosphatase 46  39 - 117 U/L   Total Bilirubin 0.6  0.3 - 1.2 mg/dL   GFR calc non Af Amer 77 (*) >90 mL/min   GFR calc Af Amer 89 (*) >90 mL/min   Comment: (NOTE)     The eGFR has been calculated using the CKD EPI equation.     This calculation has not been validated in all clinical situations.     eGFR's persistently <90 mL/min signify possible Chronic Kidney     Disease.  CBC WITH DIFFERENTIAL     Status: Abnormal   Collection Time    07/05/13 11:40 AM      Result Value Ref Range   WBC 13.1 (*) 4.0 - 10.5 K/uL   RBC 3.30 (*) 3.87 - 5.11 MIL/uL   Hemoglobin 10.4 (*) 12.0 - 15.0 g/dL   HCT 32.6 (*) 36.0 - 46.0 %   MCV 98.8  78.0 - 100.0 fL   MCH 31.5  26.0 - 34.0 pg   MCHC 31.9  30.0 - 36.0 g/dL   RDW 15.5  11.5 - 15.5 %   Platelets 256  150 - 400 K/uL   Neutrophils Relative % 65  43 - 77 %   Neutro Abs 8.5 (*) 1.7 - 7.7 K/uL   Lymphocytes Relative 27  12 - 46 %   Lymphs Abs 3.6  0.7 - 4.0 K/uL   Monocytes Relative 7  3 - 12 %   Monocytes Absolute 1.0  0.1 - 1.0 K/uL   Eosinophils Relative 1  0 - 5 %   Eosinophils Absolute 0.1  0.0 - 0.7 K/uL   Basophils Relative 0  0 - 1 %   Basophils Absolute 0.0  0.0 - 0.1 K/uL  TROPONIN I     Status:  None   Collection Time    07/05/13 11:40 AM  Result Value Ref Range   Troponin I <0.30  <0.30 ng/mL   Comment:            Due to the release kinetics of cTnI,     a negative result within the first hours     of the onset of symptoms does not rule out     myocardial infarction with certainty.     If myocardial infarction is still suspected,     repeat the test at appropriate intervals.  URINALYSIS, ROUTINE W REFLEX MICROSCOPIC     Status: Abnormal   Collection Time    07/05/13 12:37 PM      Result Value Ref Range   Color, Urine YELLOW  YELLOW   APPearance CLEAR  CLEAR   Specific Gravity, Urine 1.025  1.005 - 1.030   pH 6.0  5.0 - 8.0   Glucose, UA NEGATIVE  NEGATIVE mg/dL   Hgb urine dipstick NEGATIVE  NEGATIVE   Bilirubin Urine NEGATIVE  NEGATIVE   Ketones, ur TRACE (*) NEGATIVE mg/dL   Protein, ur NEGATIVE  NEGATIVE mg/dL   Urobilinogen, UA 0.2  0.0 - 1.0 mg/dL   Nitrite NEGATIVE  NEGATIVE   Leukocytes, UA NEGATIVE  NEGATIVE   Comment: MICROSCOPIC NOT DONE ON URINES WITH NEGATIVE PROTEIN, BLOOD, LEUKOCYTES, NITRITE, OR GLUCOSE <1000 mg/dL.    Dg Chest 2 View  07/05/2013   CLINICAL DATA:  80 year old female with difficulty swallowing. Initial encounter.  EXAM: CHEST  2 VIEW  COMPARISON:  06/28/2013 and earlier.  FINDINGS: Biapical chronic lung opacity with architectural distortion. Associated hilar retraction and chronic distortion of the tracheal contour. Stable lung volumes. Stable cardiac size and mediastinal contours. Chronic left chest cardiac pacemaker. No pneumothorax or pulmonary edema. No pleural effusion or acute pulmonary opacity. Lower thoracic compression fracture status post augmentation again noted. Osteopenia. No acute osseous abnormality identified.  IMPRESSION: Chronic apical lung disease with architectural distortion, including some traction on the trachea. Perhaps this might affect the upper thoracic esophagus as well. No acute cardiopulmonary abnormality.    Electronically Signed   By: Lars Pinks M.D.   On: 07/05/2013 12:29    ROS Blood pressure 148/73, pulse 83, temperature 98 F (36.7 C), temperature source Oral, resp. rate 16, height $RemoveBe'5\' 3"'LJcnqIaIX$  (1.6 m), weight 89 lb (40.37 kg), SpO2 95.00%. Physical Exam Alert and oriented. Skin warm and dry. Oral mucosa is moist.   . Sclera anicteric, conjunctivae is pink. Thyroid not enlarged. No cervical lymphadenopathy. Lungs clear. Heart regular rate and rhythm.  Abdomen is soft, thin. Bowel sounds are positive. No hepatomegaly. No abdominal masses felt. No tenderness.  No edema to lower extremities.  Assessment/Plan:  Possible foods impaction. I will discuss with Dr. Laural Golden. Possible EGD/ED  Butch Penny 07/05/2013, 4:46 PM    GI attending note; Patient is an 80 year old Caucasian female who has history of esophageal motility disorder and possibly distal esophageal stricture who presents with signs and symptoms of food impaction. Symptoms began over 24 hours ago while she was trying to eat chicken at lunch yesterday. She is unable to handle her own saliva. Therefore she has complete obstruction and in need of therapeutic EGD. Patient had EGD with EGD in December 2014 with transient relief of dysphagia. She had a barium esophagogram on 03/03/2013 revealing numerous diverticula at midesophagus diffuse esophageal spasm tiny Zenker's diverticulum and large hiatal hernia barium pill guts stuck in distal esophagus. Patient was begun on low dose is more but she could not tolerate it  because of hypotension. Patient is agreeable to proceed with EGD with foreign body removal and esophageal dilation this evening.

## 2013-07-05 NOTE — ED Notes (Signed)
Assisted patient to bathroom. Urine specimen collected.

## 2013-07-05 NOTE — Op Note (Signed)
EGD PROCEDURE REPORT  PATIENT:  Kaitlyn Cooper  MR#:  [id number] Birthdate:  12-16-1926, 80 y.o., female Endoscopist:  Dr. Rogene Houston, MD Referred By:  Dr. Cherene Altes, MD Procedure Date: 07/05/2013  Procedure:   EGD with foreign body removal and esophageal dilation (balloon).  Indications:  Patient is an 80 year old Caucasian female who has history of esophageal motility disorder who presents with signs and symptoms of esophageal food impaction. She is undergoing therapeutic EGD.            Informed Consent:  The risks, benefits, alternatives & imponderables which include, but are not limited to, bleeding, infection, perforation, drug reaction and potential missed lesion have been reviewed.  The potential for biopsy, lesion removal, esophageal dilation, etc. have also been discussed.  Questions have been answered.  All parties agreeable.  Please see history & physical in medical record for more information.  Medications:  Demerol 25 mg IV Versed 3 mg IV Cetacaine spray topically for oropharyngeal anesthesia  Description of procedure:  The endoscope was introduced through the mouth and advanced to thestomach. The mucosal surfaces were surveyed very carefully during advancement of the scope and upon withdrawal.  Findings:  Esophagus:  Food debris noted coating the esophageal mucosa throughout. Consistency was one of thick liquid with solid pieces. Scope carefully advanced through tortuous esophagus into stomach. Some of the 4 debris was pushed distally and rest suction the scope and esophagus cleared. Examination mucosa was suboptimal. Diverticulum noted in distal esophagus. Resistance noted as the scope was passed through distal segment. GEJ:  33 cm Hiatus:  38 cm Stomach:  Stomach was empty other than small amount of food debris which was just pushed distally. Folds in the proximal stomach are normal. Examination mucosa gastric body, antrum, pyloric channel, angularis and fundus  was unremarkable. Duodenum:  Not examined.  Therapeutic/Diagnostic Maneuvers Performed:   Balloon dilator was passed with the school. Guidewire was pushed into the gastric lumen. A balloon dilator was positioned across distal esophagus and insufflated to dilator 15 mm was maintained for a few seconds and pushed distally. No mucosal disruption was induced. The balloon was deflated and withdrawn and the scope was also removed.  Complications:  None  Impression: Esophageal foreign body removed with combination suction with the scope and pushing food bolus distally. Tortuous esophagus with diverticulum at distal esophagus. Distal segment of esophagus dilated to 15 mm using balloon dilator.  Recommendations:  Full liquid diet. Will arrange for esophageal manometry which might help in her management.  Rogene Houston  07/05/2013  6:54 PM  CC: Dr. Sallee Lange, MD & Dr. Rayne Du ref. provider found

## 2013-07-06 ENCOUNTER — Encounter (HOSPITAL_COMMUNITY): Payer: Self-pay | Admitting: Internal Medicine

## 2013-07-06 DIAGNOSIS — E860 Dehydration: Secondary | ICD-10-CM

## 2013-07-06 LAB — MAGNESIUM: Magnesium: 1.9 mg/dL (ref 1.5–2.5)

## 2013-07-06 LAB — COMPREHENSIVE METABOLIC PANEL
ALBUMIN: 2.6 g/dL — AB (ref 3.5–5.2)
ALT: 9 U/L (ref 0–35)
AST: 16 U/L (ref 0–37)
Alkaline Phosphatase: 46 U/L (ref 39–117)
BUN: 14 mg/dL (ref 6–23)
CALCIUM: 8 mg/dL — AB (ref 8.4–10.5)
CO2: 22 mEq/L (ref 19–32)
CREATININE: 0.66 mg/dL (ref 0.50–1.10)
Chloride: 109 mEq/L (ref 96–112)
GFR calc Af Amer: >90 mL/min (ref >90)
GFR, EST NON AFRICAN AMERICAN: 79 mL/min — AB (ref >90)
Glucose, Bld: 88 mg/dL (ref 70–99)
Potassium: 4.2 mEq/L (ref 3.7–5.3)
Sodium: 143 mEq/L (ref 137–147)
Total Bilirubin: 0.5 mg/dL (ref 0.3–1.2)
Total Protein: 5.4 g/dL — ABNORMAL LOW (ref 6.0–8.3)

## 2013-07-06 LAB — CBC
HCT: 30.1 % — ABNORMAL LOW (ref 36.0–46.0)
Hemoglobin: 9.7 g/dL — ABNORMAL LOW (ref 12.0–15.0)
MCH: 31.9 pg (ref 26.0–34.0)
MCHC: 32.2 g/dL (ref 30.0–36.0)
MCV: 99 fL (ref 78.0–100.0)
PLATELETS: 232 10*3/uL (ref 150–400)
RBC: 3.04 MIL/uL — AB (ref 3.87–5.11)
RDW: 15.5 % (ref 11.5–15.5)
WBC: 12.4 10*3/uL — AB (ref 4.0–10.5)

## 2013-07-06 MED ORDER — FLUDROCORTISONE ACETATE 0.1 MG PO TABS
0.1000 mg | ORAL_TABLET | Freq: Every day | ORAL | Status: DC
  Administered 2013-07-06: 0.1 mg via ORAL
  Filled 2013-07-06 (×3): qty 1

## 2013-07-06 MED ORDER — PREDNISONE 10 MG PO TABS
10.0000 mg | ORAL_TABLET | Freq: Every day | ORAL | Status: DC
  Administered 2013-07-06: 10 mg via ORAL
  Filled 2013-07-06: qty 1

## 2013-07-06 NOTE — Progress Notes (Signed)
[facility] Progress Note   Kaitlyn Cooper ZOX:[id number] DOB: Mar 10, 1926 DOA: 07/05/2013 PCP: Sallee Lange, MD  Brief narrative: Kaitlyn Cooper is a 80 y.o. female presenting on 07/05/2013 with difficulty swallowing for a few weeks.    Subjective: Has not eating yet this morning. No complaints.   Assessment/Plan: Principal Problem:   Esophageal obstruction due to food impaction-- History of esophageal stricture - s/p EGD and dilatation of distal esophageal narrowing - esophageal diverticulum also seen - will monitor for further symptoms of dysphagia today and if tolerating diet, can go home tomorrow - will need manometry as outpt - appreciate management per GI  Active Problems:   Hypokalemia - resolved  Adrenal insufficieny - will attempt to resume Prednisone today and d/c the solumedrol  Hyperlipidemia - start low fat diet   Code Status: Full code Family Communication: none Disposition Plan: home  Consultants: GI  Procedures: EGD- 5/20  Antibiotics: Antibiotics Given (last 72 hours)   None       DVT prophylaxis: scd   Objective: Filed Weights   07/05/13 1007  Weight: 40.37 kg (89 lb)   Filed Vitals:   07/05/13 1845 07/05/13 1850 07/05/13 2130 07/06/13 0539  BP: 105/77  90/53 132/60  Pulse: 77 76 77 78  Temp:   98.5 F (36.9 C) 98.4 F (36.9 C)  TempSrc:   Oral Oral  Resp: 17 19 18 18   Height:      Weight:      SpO2: 99% 100% 94% 96%     Intake/Output Summary (Last 24 hours) at 07/06/13 1137 Last data filed at 07/06/13 0852  Gross per 24 hour  Intake    250 ml  Output    975 ml  Net   -725 ml     Exam: General: No acute respiratory distress Lungs: Clear to auscultation bilaterally without wheezes or crackles Cardiovascular: Regular rate and rhythm without murmur gallop or rub normal S1 and S2 Abdomen: Nontender, nondistended, soft, bowel sounds positive, no rebound, no ascites, no appreciable mass Extremities: No  significant cyanosis, clubbing, or edema bilateral lower extremities  Data Reviewed: Basic Metabolic Panel:  Recent Labs Lab 07/05/13 1140 07/06/13 0613  NA 145 143  K 3.3* 4.2  CL 108 109  CO2 23 22  GLUCOSE 74 88  BUN 22 14  CREATININE 0.71 0.66  CALCIUM 8.6 8.0*  MG  --  1.9   Liver Function Tests:  Recent Labs Lab 07/05/13 1140 07/06/13 0613  AST 16 16  ALT 11 9  ALKPHOS 46 46  BILITOT 0.6 0.5  PROT 6.1 5.4*  ALBUMIN 2.9* 2.6*   No results found for this basename: LIPASE, AMYLASE,  in the last 168 hours No results found for this basename: AMMONIA,  in the last 168 hours CBC:  Recent Labs Lab 07/05/13 1140 07/06/13 0613  WBC 13.1* 12.4*  NEUTROABS 8.5*  --   HGB 10.4* 9.7*  HCT 32.6* 30.1*  MCV 98.8 99.0  PLT 256 232   Cardiac Enzymes:  Recent Labs Lab 07/05/13 1140  TROPONINI <0.30   BNP (last 3 results) No results found for this basename: PROBNP,  in the last 8760 hours CBG: No results found for this basename: GLUCAP,  in the last 168 hours  No results found for this or any previous visit (from the past 240 hour(s)).   Studies:  Recent x-ray studies have been reviewed in detail by the Attending Physician  Scheduled Meds:  Scheduled Meds: . folic acid  1 mg Intravenous Daily  . levothyroxine  37.5 mcg Intravenous QAC breakfast  . methylPREDNISolone (SOLU-MEDROL) injection  20 mg Intravenous Q24H  . pantoprazole (PROTONIX) IV  40 mg Intravenous Q24H  . thiamine IV  100 mg Intravenous Daily   Continuous Infusions: . sodium chloride 20 mL/hr at 07/05/13 1745  . dextrose 5 % and 0.9 % NaCl with KCl 40 mEq/L 60 mL/hr at 07/05/13 1909    Time spent on care of this patient: >35 min   Debbe Odea, MD 07/06/2013, 11:37 AM  LOS: 1 day   [facility] Office  [phone] Pager - Text Page per Shea Evans   If 7PM-7AM, please contact night-coverage [email or web address]

## 2013-07-06 NOTE — Progress Notes (Signed)
Per MD would like for patient to consume one meal with new diet ordered before discharge.  Family informed and in agreement.

## 2013-07-06 NOTE — Care Management Note (Signed)
    Page 1 of 1   07/06/2013     1:50:20 PM CARE MANAGEMENT NOTE 07/06/2013  Patient:  Kaitlyn Cooper, Kaitlyn Cooper   Account Number:  [id number]  Date Initiated:  07/06/2013  Documentation initiated by:  Theophilus Kinds  Subjective/Objective Assessment:   Pt admitted from home with difficulty swallowing and food impaction. Pt lives at home and has a sister who lives with the pt. Pt is fairly independent with ADL's. Pt has a cane, walker, and BSC for home use.     Action/Plan:   No CM needs noted.   Anticipated DC Date:  07/07/2013   Anticipated DC Plan:  [place]  CM consult      Choice offered to / List presented to:             Status of service:  Completed, signed off Medicare Important Message given?   (If response is "NO", the following Medicare IM given date fields will be blank) Date Medicare IM given:   Date Additional Medicare IM given:    Discharge Disposition:  HOME/SELF CARE  Per UR Regulation:    If discussed at Long Length of Stay Meetings, dates discussed:    Comments:  07/06/13 [place], RN BSN CM

## 2013-07-06 NOTE — Progress Notes (Signed)
Patient states she's not having difficulty with full liquid diet. Patient advised heat slowly. H&H 9.7 and 30.1. Serum albumin 2.6.  Assessment; Esophageal motility disorder with pseudo-stricture distally. Status post EGD with foreign body removal in the balloon dilation yesterday. Anemia appears to be due to chronic disease. She has been evaluated before. Hypoalbuminemia.  Recommendations;  Esophageal manometry at [facility] as an outpatient. We'll check pre-albumin. Advance diet to pureed diet.

## 2013-07-08 ENCOUNTER — Telehealth: Payer: Self-pay

## 2013-07-08 DIAGNOSIS — K222 Esophageal obstruction: Secondary | ICD-10-CM

## 2013-07-08 NOTE — Discharge Summary (Addendum)
Physician Discharge Summary  MARGERIE FRAISER UYQ:[id number] DOB: Apr 11, 1926 DOA: 07/05/2013  PCP: Sallee Lange, MD  Admit date: 07/05/2013 Discharge date: 07/08/2013  Time spent: >45 minutes   Discharge Diagnoses:  Principal Problem:   Esophageal obstruction due to food impaction Active Problems:   Hypokalemia   History of esophageal stricture Hyperlipidemia  Discharge Condition: stable  Diet recommendation: heart healthy  Filed Weights   07/05/13 1007  Weight: 40.37 kg (89 lb)    History of present illness:  Very pleasant 80yo female w/ a hx of prior esophageal dilatations who presented with a 24hr hx of inability to swallow even her own saliva. Her last dilatation appears to have been Dec 2014 (though family states was done in Feb 2015 as well). She reports that she has noted very slowly worsening difficulty swallowing for some weeks now, but as early as yesterday morning was able to eat and entire bowl of cheerios without any difficulty. For lunch yesterday she attempted to eat a chicken breast that she states was "very dry." It was following this meal that she developed the acute onset of the inability to swallow even liquids. She has not eaten or drank anything since that time. She has not been able to take her medications. She specifically states that she is not sob, nor has she experienced wheezing/stridor.   Hospital Course:  Principal Problem:  Esophageal obstruction due to food impaction-- History of esophageal stricture  - s/p EGD and dilatation of distal esophageal narrowing  - esophageal diverticulum also seen  - tolerating a pureed diet - will need manometry as outpt  - appreciate management per GI   Active Problems:   Hypokalemia  - resolved   Adrenal insufficieny  -takes Prednisone and Florinef as outpt - has been receiving IV solumedrol due to difficulty swallowing- resumed Prednisone this morning which she tolerated well  Hyperlipidemia  - start low fat  diet   Procedures:  EGD 5/19  Consultations:  GI- Dr Laural Golden  Discharge Exam: Filed Vitals:   07/06/13 1300  BP: 107/65  Pulse: 86  Temp: 98.8 F (37.1 C)  Resp: 18   General: No acute respiratory distress  Lungs: Clear to auscultation bilaterally without wheezes or crackles  Cardiovascular: Regular rate and rhythm without murmur gallop or rub normal S1 and S2  Abdomen: Nontender, nondistended, soft, bowel sounds positive, no rebound, no ascites, no appreciable mass  Extremities: No significant cyanosis, clubbing, or edema bilateral lower extremities  Discharge Instructions You were cared for by a hospitalist during your hospital stay. If you have any questions about your discharge medications or the care you received while you were in the hospital after you are discharged, you can call the unit and asked to speak with the hospitalist on call if the hospitalist that took care of you is not available. Once you are discharged, your primary care physician will handle any further medical issues. Please note that NO REFILLS for any discharge medications will be authorized once you are discharged, as it is imperative that you return to your primary care physician (or establish a relationship with a primary care physician if you do not have one) for your aftercare needs so that they can reassess your need for medications and monitor your lab values.  Discharge Instructions   Diet - low sodium heart healthy    Complete by:  As directed      Discharge instructions    Complete by:  As directed   Soft pureed diet to  advance as tolerated. Take medications as directed. Follow up with GI as instructed     Increase activity slowly    Complete by:  As directed             Medication List    STOP taking these medications       metroNIDAZOLE 500 MG tablet  Commonly known as:  FLAGYL      TAKE these medications       acetaminophen 500 MG tablet  Commonly known as:  TYLENOL  Take  1,000 mg by mouth every 6 (six) hours as needed. Pain     atorvastatin 10 MG tablet  Commonly known as:  LIPITOR  Take 1 tablet (10 mg total) by mouth daily at 6 PM.     cyanocobalamin 1000 MCG/ML injection  Commonly known as:  (VITAMIN B-12)  Inject 1 mL (1,000 mcg total) into the muscle every 30 (thirty) days.     fludrocortisone 0.1 MG tablet  Commonly known as:  FLORINEF  Take 0.1 mg by mouth daily.     folic acid 1 MG tablet  Commonly known as:  FOLVITE  Take 1 tablet (1 mg total) by mouth daily.     IMODIUM A-D 2 MG tablet  Generic drug:  loperamide  Take 2 mg by mouth daily as needed for diarrhea or loose stools.     levothyroxine 75 MCG tablet  Commonly known as:  SYNTHROID, LEVOTHROID  Take 1 tablet (75 mcg total) by mouth daily before breakfast.     megestrol 20 MG tablet  Commonly known as:  MEGACE  Take 20 mg by mouth daily.     omeprazole 20 MG capsule  Commonly known as:  PRILOSEC  Take 1 capsule (20 mg total) by mouth daily.     oxyCODONE-acetaminophen 5-325 MG per tablet  Commonly known as:  PERCOCET/ROXICET  Take 1-2 tablets by mouth every 4 (four) hours as needed for moderate pain.     predniSONE 10 MG tablet  Commonly known as:  DELTASONE  Take 1 tablet (10 mg total) by mouth daily. For treatment of adrenal insufficiency.     sertraline 25 MG tablet  Commonly known as:  ZOLOFT  Take 1 tablet (25 mg total) by mouth daily.     thiamine 100 MG tablet  Take 1 tablet (100 mg total) by mouth daily.     Vitamin D 400 UNITS capsule  Take 400 Units by mouth daily.       Allergies  Allergen Reactions  . Penicillins Rash       Follow-up Information   Follow up with LUKING,SCOTT, MD. Schedule an appointment as soon as possible for a visit in 1 week.   Specialty:  Family Medicine   Contact information:   [address] [place] [address] [phone]        The results of significant diagnostics from this hospitalization  (including imaging, microbiology, ancillary and laboratory) are listed below for reference.    Significant Diagnostic Studies: Dg Chest 2 View  07/05/2013   CLINICAL DATA:  80 year old female with difficulty swallowing. Initial encounter.  EXAM: CHEST  2 VIEW  COMPARISON:  06/28/2013 and earlier.  FINDINGS: Biapical chronic lung opacity with architectural distortion. Associated hilar retraction and chronic distortion of the tracheal contour. Stable lung volumes. Stable cardiac size and mediastinal contours. Chronic left chest cardiac pacemaker. No pneumothorax or pulmonary edema. No pleural effusion or acute pulmonary opacity. Lower thoracic compression fracture status post augmentation  again noted. Osteopenia. No acute osseous abnormality identified.  IMPRESSION: Chronic apical lung disease with architectural distortion, including some traction on the trachea. Perhaps this might affect the upper thoracic esophagus as well. No acute cardiopulmonary abnormality.   Electronically Signed   By: Lars Pinks M.D.   On: 07/05/2013 12:29   Dg Thoracic Spine 2 View  06/16/2013   CLINICAL DATA:  Thoracic compression fracture.  FLUOROSCOPY TIME:  2 min 30 seconds  EXAM: THORACIC SPINE - 2 VIEW; DG C-ARM 1-60 MIN  COMPARISON:  Radiographs dated 05/02/2013  FINDINGS: AP and lateral C-arm images demonstrate the patient has undergone kyphoplasty at T9.  IMPRESSION: Kyphoplasty performed at T9.   Electronically Signed   By: Rozetta Nunnery M.D.   On: 06/16/2013 14:22   Ct Abdomen Pelvis W Contrast  06/19/2013   CLINICAL DATA:  Left lower abdominal pain.  EXAM: CT ABDOMEN AND PELVIS WITH CONTRAST  TECHNIQUE: Multidetector CT imaging of the abdomen and pelvis was performed using the standard protocol following bolus administration of intravenous contrast.  CONTRAST:  70mL OMNIPAQUE IOHEXOL 300 MG/ML SOLN, 3mL OMNIPAQUE IOHEXOL 300 MG/ML SOLN  COMPARISON:  09/13/2008  FINDINGS: The lung bases are clear.  No pleural or pericardial  effusion.  Normal appearance of the liver. Previous cholecystectomy. No biliary dilatation. The pancreas appears normal. Calcified splenic granulomas are noted.  The adrenal glands are both normal. Normal appearance of the left kidney. Normal appearance of the right kidney. The urinary bladder appears normal. Previous hysterectomy. Calcified atherosclerotic disease involves the abdominal aorta. The aorta is ectatic measuring 2.5 cm in AP dimension, image 24/series 2. No pathologically enlarged lymph nodes within the upper abdomen. No pelvic or inguinal adenopathy identified.  The patient has a large hiatal hernia. Large duodenal diverticulum is again identified. The small bowel loops have a normal course and caliber. Normal appearance of the proximal colon. There are multiple distal colonic diverticula. Apparent wall thickening of the sigmoid colon is identified. No significant inflammation or free fluid. No evidence for perforation.  Degenerative disc disease noted within the lumbar spine. This is most severe at L1-2.  IMPRESSION: 1. Distal colonic diverticulosis. There is apparent wall thickening involving the sigmoid colon without significant inflammation or free fluid. Correlate for any clinical signs or symptoms of colitis. 2. Atherosclerotic disease with ectasia of the abdominal aorta. Ectatic abdominal aorta at risk for aneurysm development. Recommend follow up by [place] in 5 years. This recommendation follows ACR consensus guidelines: White Paper of the ACR Incidental Findings Committee II on Vascular Findings. J Am Coll Radiol 2013; 10:789-794. 3. Hiatal hernia.   Electronically Signed   By: Kerby Moors M.D.   On: 06/19/2013 16:09   Dg C-arm 1-60 Min  06/16/2013   CLINICAL DATA:  Thoracic compression fracture.  FLUOROSCOPY TIME:  2 min 30 seconds  EXAM: THORACIC SPINE - 2 VIEW; DG C-ARM 1-60 MIN  COMPARISON:  Radiographs dated 05/02/2013  FINDINGS: AP and lateral C-arm images demonstrate the patient has  undergone kyphoplasty at T9.  IMPRESSION: Kyphoplasty performed at T9.   Electronically Signed   By: Rozetta Nunnery M.D.   On: 06/16/2013 14:22    Microbiology: No results found for this or any previous visit (from the past 240 hour(s)).   Labs: Basic Metabolic Panel:  Recent Labs Lab 07/05/13 1140 07/06/13 0613  NA 145 143  K 3.3* 4.2  CL 108 109  CO2 23 22  GLUCOSE 74 88  BUN 22 14  CREATININE 0.71  0.66  CALCIUM 8.6 8.0*  MG  --  1.9   Liver Function Tests:  Recent Labs Lab 07/05/13 1140 07/06/13 0613  AST 16 16  ALT 11 9  ALKPHOS 46 46  BILITOT 0.6 0.5  PROT 6.1 5.4*  ALBUMIN 2.9* 2.6*   No results found for this basename: LIPASE, AMYLASE,  in the last 168 hours No results found for this basename: AMMONIA,  in the last 168 hours CBC:  Recent Labs Lab 07/05/13 1140 07/06/13 0613  WBC 13.1* 12.4*  NEUTROABS 8.5*  --   HGB 10.4* 9.7*  HCT 32.6* 30.1*  MCV 98.8 99.0  PLT 256 232   Cardiac Enzymes:  Recent Labs Lab 07/05/13 1140  TROPONINI <0.30   BNP: BNP (last 3 results) No results found for this basename: PROBNP,  in the last 8760 hours CBG: No results found for this basename: GLUCAP,  in the last 168 hours     Signed:  Debbe Odea, MD  [facility] 07/06/2013, 7:38 AM

## 2013-07-08 NOTE — Telephone Encounter (Signed)
Message copied by Barron Alvine on Fri Jul 08, 2013  8:17 AM ------      Message from: Owens Loffler P      Created: Thu Jul 07, 2013  3:37 PM      Regarding: RE: esophageal manometry      Contact: [phone]       Najeeb,      As we discussed, we will get this set up.            Ulice Dash,      This is the patient we talked about from [place].  I will also forward this note to Remo Lipps and Norberto Sorenson to serve as a start point for discussing a workflow for future hi res manometry referrals from the [place] team.  Bernadene Person tells me that he previously would send a small handful of these cases per year to [facility] or [facility] however would like to send them to [facility] instead really because it will be easier for his the patients.              Darra Rosa,      Can you work with Dr. Hilarie Fredrickson about putting in hi res manometry order for this patient.  Thanks            DJ                              ----- Message -----         From: Barron Alvine, CMA         Sent: 07/07/2013   2:56 PM           To: Milus Banister, MD      Subject: Melton Alar: esophageal manometry                                             ----- Message -----         From: Barron Alvine, CMA         Sent: 07/07/2013   2:56 PM           To: Barron Alvine, CMA      Subject: FW: esophageal manometry                                             ----- Message -----         From: Worthy Keeler         Sent: 07/07/2013   2:47 PM           To: Barron Alvine, CMA      Subject: esophageal manometry                                     Hi Hampton Cost,            Dr Laural Golden said he spoke to you about getting Ms Fendrick scheduled for esophageal manometry. All her information is in EPIC, if you need anything additional please let me know.            Thanks  Lacretia Nicks, Referral Coordinator       ------

## 2013-07-08 NOTE — Telephone Encounter (Signed)
You have been scheduled for an esophageal manometry at [facility] on June 8 at 3 pm. Please arrive 30 minutes prior to your procedure for registration. You will need to go to outpatient registration (1st floor of the hospital) first. Make certain to bring your insurance cards as well as a complete list of medications.  Please remember the following:  1) Nothing to eat or drink after 12:00 midnight on the night before your test.  2) Hold all diabetic medications/insulin the morning of the test. You may eat and take your medications after the test.  3) For 3 days prior to your test do not take: Dexilant, Prevacid, Nexium, Protonix,  Aciphex, Zegerid, Pantoprazole, Prilosec or omeprazole.  4) For 2 days prior to your test, do not take: Reglan, Tagamet, Zantac, Axid or Pepcid.  5) You MAY use an antacid such as Rolaids or Tums up to 12 hours prior to your test.  Pt has been instructed and instructions will be mailed to the home   It will take at least 2 weeks to receive the results of this test from your physician. ------------------------------------------ ABOUT ESOPHAGEAL MANOMETRY Esophageal manometry (muh-NOM-uh-tree) is a test that gauges how well your esophagus works. Your esophagus is the long, muscular tube that connects your throat to your stomach. Esophageal manometry measures the rhythmic muscle contractions (peristalsis) that occur in your esophagus when you swallow. Esophageal manometry also measures the coordination and force exerted by the muscles of your esophagus.  During esophageal manometry, a thin, flexible tube (catheter) that contains sensors is passed through your nose, down your esophagus and into your stomach. Esophageal manometry can be helpful in diagnosing some mostly uncommon disorders that affect your esophagus.  Why it's done Esophageal manometry is used to evaluate the movement (motility) of food through the esophagus and into the stomach. The test  measures how well the circular bands of muscle (sphincters) at the top and bottom of your esophagus open and close, as well as the pressure, strength and pattern of the wave of esophageal muscle contractions that moves food along.  What you can expect Esophageal manometry is an outpatient procedure done without sedation. Most people tolerate it well. You may be asked to change into a hospital gown before the test starts.  During esophageal manometry  While you are sitting up, a member of your health care team sprays your throat with a numbing medication or puts numbing gel in your nose or both.  A catheter is guided through your nose into your esophagus. The catheter may be sheathed in a water-filled sleeve. It doesn't interfere with your breathing. However, your eyes may water, and you may gag. You may have a slight nosebleed from irritation.  After the catheter is in place, you may be asked to lie on your back on an exam table, or you may be asked to remain seated.  You then swallow small sips of water. As you do, a computer connected to the catheter records the pressure, strength and pattern of your esophageal muscle contractions.  During the test, you'll be asked to breathe slowly and smoothly, remain as still as possible, and swallow only when you're asked to do so.  A member of your health care team may move the catheter down into your stomach while the catheter continues its measurements.  The catheter then is slowly withdrawn. The test usually lasts 20 to 30 minutes.  After esophageal manometry  When your esophageal manometry is complete, you may return to  your normal activities  This test typically takes 30-45 minutes to complete. ________________________________________________________________________________'

## 2013-07-08 NOTE — Telephone Encounter (Signed)
I spoke to the pt and she can do any Monday as late in the afternoon as possible.  I left a message at [place] to schedule and will call the pt as soon as I have the details.

## 2013-07-15 ENCOUNTER — Emergency Department (HOSPITAL_COMMUNITY): Payer: Medicare Other

## 2013-07-15 ENCOUNTER — Emergency Department (HOSPITAL_COMMUNITY)
Admission: EM | Admit: 2013-07-15 | Discharge: 2013-07-15 | Disposition: A | Discharge status: Home / Self Care | Payer: Medicare Other | Attending: Emergency Medicine | Admitting: Emergency Medicine

## 2013-07-15 ENCOUNTER — Telehealth: Payer: Self-pay | Admitting: Gastroenterology

## 2013-07-15 ENCOUNTER — Encounter (HOSPITAL_COMMUNITY)
Admission: EM | Disposition: A | Discharge status: Home / Self Care | Payer: Self-pay | Source: Home / Self Care | Attending: Emergency Medicine

## 2013-07-15 ENCOUNTER — Encounter (HOSPITAL_COMMUNITY): Payer: Self-pay | Admitting: Emergency Medicine

## 2013-07-15 DIAGNOSIS — F329 Major depressive disorder, single episode, unspecified: Secondary | ICD-10-CM | POA: Insufficient documentation

## 2013-07-15 DIAGNOSIS — K219 Gastro-esophageal reflux disease without esophagitis: Secondary | ICD-10-CM | POA: Insufficient documentation

## 2013-07-15 DIAGNOSIS — T18108A Unspecified foreign body in esophagus causing other injury, initial encounter: Secondary | ICD-10-CM | POA: Insufficient documentation

## 2013-07-15 DIAGNOSIS — Z950 Presence of cardiac pacemaker: Secondary | ICD-10-CM | POA: Insufficient documentation

## 2013-07-15 DIAGNOSIS — E039 Hypothyroidism, unspecified: Secondary | ICD-10-CM | POA: Insufficient documentation

## 2013-07-15 DIAGNOSIS — K229 Disease of esophagus, unspecified: Secondary | ICD-10-CM

## 2013-07-15 DIAGNOSIS — K222 Esophageal obstruction: Secondary | ICD-10-CM

## 2013-07-15 DIAGNOSIS — K449 Diaphragmatic hernia without obstruction or gangrene: Secondary | ICD-10-CM | POA: Insufficient documentation

## 2013-07-15 DIAGNOSIS — IMO0002 Reserved for concepts with insufficient information to code with codable children: Secondary | ICD-10-CM | POA: Insufficient documentation

## 2013-07-15 DIAGNOSIS — E785 Hyperlipidemia, unspecified: Secondary | ICD-10-CM | POA: Insufficient documentation

## 2013-07-15 DIAGNOSIS — F3289 Other specified depressive episodes: Secondary | ICD-10-CM | POA: Insufficient documentation

## 2013-07-15 DIAGNOSIS — J449 Chronic obstructive pulmonary disease, unspecified: Secondary | ICD-10-CM | POA: Insufficient documentation

## 2013-07-15 DIAGNOSIS — D649 Anemia, unspecified: Secondary | ICD-10-CM | POA: Insufficient documentation

## 2013-07-15 DIAGNOSIS — J4489 Other specified chronic obstructive pulmonary disease: Secondary | ICD-10-CM | POA: Insufficient documentation

## 2013-07-15 DIAGNOSIS — T18128A Food in esophagus causing other injury, initial encounter: Secondary | ICD-10-CM

## 2013-07-15 DIAGNOSIS — Q398 Other congenital malformations of esophagus: Secondary | ICD-10-CM | POA: Insufficient documentation

## 2013-07-15 HISTORY — PX: ESOPHAGOGASTRODUODENOSCOPY: SHX5428

## 2013-07-15 LAB — CBC WITH DIFFERENTIAL/PLATELET
BASOS ABS: 0 10*3/uL (ref 0.0–0.1)
Basophils Relative: 0 % (ref 0–1)
Eosinophils Absolute: 0.1 10*3/uL (ref 0.0–0.7)
Eosinophils Relative: 1 % (ref 0–5)
HEMATOCRIT: 36.1 % (ref 36.0–46.0)
Hemoglobin: 11.4 g/dL — ABNORMAL LOW (ref 12.0–15.0)
LYMPHS PCT: 35 % (ref 12–46)
Lymphs Abs: 3.9 10*3/uL (ref 0.7–4.0)
MCH: 31.2 pg (ref 26.0–34.0)
MCHC: 31.6 g/dL (ref 30.0–36.0)
MCV: 98.9 fL (ref 78.0–100.0)
MONO ABS: 1.1 10*3/uL — AB (ref 0.1–1.0)
MONOS PCT: 10 % (ref 3–12)
NEUTROS ABS: 6.2 10*3/uL (ref 1.7–7.7)
Neutrophils Relative %: 54 % (ref 43–77)
Platelets: 288 10*3/uL (ref 150–400)
RBC: 3.65 MIL/uL — ABNORMAL LOW (ref 3.87–5.11)
RDW: 14.5 % (ref 11.5–15.5)
WBC: 11.4 10*3/uL — AB (ref 4.0–10.5)

## 2013-07-15 LAB — BASIC METABOLIC PANEL
BUN: 17 mg/dL (ref 6–23)
CHLORIDE: 102 meq/L (ref 96–112)
CO2: 26 meq/L (ref 19–32)
Calcium: 8.8 mg/dL (ref 8.4–10.5)
Creatinine, Ser: 0.77 mg/dL (ref 0.50–1.10)
GFR calc Af Amer: 87 mL/min — ABNORMAL LOW (ref >90)
GFR calc non Af Amer: 75 mL/min — ABNORMAL LOW (ref >90)
Glucose, Bld: 86 mg/dL (ref 70–99)
Potassium: 3.2 mEq/L — ABNORMAL LOW (ref 3.7–5.3)
Sodium: 144 mEq/L (ref 137–147)

## 2013-07-15 SURGERY — EGD (ESOPHAGOGASTRODUODENOSCOPY)
Anesthesia: Moderate Sedation

## 2013-07-15 MED ORDER — LIDOCAINE VISCOUS 2 % MT SOLN
OROMUCOSAL | Status: AC
  Filled 2013-07-15: qty 15

## 2013-07-15 MED ORDER — ONDANSETRON HCL 4 MG/2ML IJ SOLN
INTRAMUSCULAR | Status: DC | PRN
  Administered 2013-07-15: 4 mg via INTRAVENOUS

## 2013-07-15 MED ORDER — ONDANSETRON HCL 4 MG/2ML IJ SOLN
INTRAMUSCULAR | Status: DC
Start: 2013-07-15 — End: 2013-07-15
  Filled 2013-07-15: qty 2

## 2013-07-15 MED ORDER — LIDOCAINE VISCOUS 2 % MT SOLN
OROMUCOSAL | Status: DC | PRN
  Administered 2013-07-15: 4 mL via OROMUCOSAL

## 2013-07-15 MED ORDER — MIDAZOLAM HCL 5 MG/5ML IJ SOLN
INTRAMUSCULAR | Status: DC | PRN
  Administered 2013-07-15 (×2): 1 mg via INTRAVENOUS

## 2013-07-15 MED ORDER — MIDAZOLAM HCL 5 MG/5ML IJ SOLN
INTRAMUSCULAR | Status: AC
  Filled 2013-07-15: qty 10

## 2013-07-15 MED ORDER — MEPERIDINE HCL 100 MG/ML IJ SOLN
INTRAMUSCULAR | Status: AC
  Filled 2013-07-15: qty 2

## 2013-07-15 MED ORDER — STERILE WATER FOR IRRIGATION IR SOLN
Status: DC | PRN
  Administered 2013-07-15: 16:00:00

## 2013-07-15 MED ORDER — MEPERIDINE HCL 100 MG/ML IJ SOLN
INTRAMUSCULAR | Status: DC | PRN
  Administered 2013-07-15 (×2): 25 mg via INTRAVENOUS

## 2013-07-15 NOTE — ED Provider Notes (Signed)
CSN: [id number]     Arrival date & time 07/15/13  1105 History   This chart was scribed for Sharyon Cable, MD by Roe Coombs, ED Scribe. The patient was seen in room APA04/APA04. Patient's care was started at 3:38 PM.  Chief Complaint  Patient presents with  . Dysphagia    The history is provided by the patient and a relative. No language interpreter was used.   HPI Comments: Kaitlyn Cooper is a 80 y.o. female with history of GERD and EGD with esophageal dilation who presents to the Emergency Department complaining of constant dysphagia onset 2 days ago. She is unable to swallow saliva, liquids or solid foods. Her last meal was 2 days ago. Pt's last EGD was on 07/05/13 with Dr. Laural Golden. She is scheduled for a esophageal manometry at [facility] with Dr. Ardis Hughs on 07/25/13 She is also experiencing pain in her upper chest when she tries to swallow.  She denies shortness of breath, fever, chills or other symptoms. Patient's other medical history includes atherosclerotic CVD, hyperlipidemia, hypothyroidism, chronic back pain, COPD.  Past Medical History  Diagnosis Date  . Anemia     followed by Dr.Neijstrom  . Hyperlipidemia   . Hypothyroidism   . History of atherosclerotic cardiovascular disease   . History of colonoscopy     02/2002 showed few diverticula at sigmoid colon otherwise normal colonoscopy and terminal ileoscopy 1995 several colon polyps ,tubular adenoma  . GERD (gastroesophageal reflux disease)     chronic  . GERD (gastroesophageal reflux disease)     chronic gerd  . Tiredness   . T9 vertebral fracture 02/09/13    result of fall at home  . Adrenal insufficiency 03/23/2013  . Orthostatic hypotension 03/21/2013  . Pacemaker   . Heart murmur     "years ago"  . COPD (chronic obstructive pulmonary disease)   . Pneumonia   . Depression     takes Zoloft  . HYPERLIPIDEMIA 10/24/2008    Qualifier: Diagnosis of  By: Dwaine Gale, CNA, [place]    . C. difficile colitis 06/21/2013   Past  Surgical History  Procedure Laterality Date  . Insert / replace / remove pacemaker  01/17/2009    [employer]ude Cardiac Pacemaker   . Cardiac catheterization  1998  . Cholecystectomy    . 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 Esophagogastroduodenoscopy (egd) with esophageal dilation  03/22/2012    Procedure: ESOPHAGOGASTRODUODENOSCOPY (EGD) WITH ESOPHAGEAL DILATION;  Surgeon: Rogene Houston, MD;  [facility];  Service: Endoscopy;  Laterality: N/A;  730  . Colonoscopy N/A 12/29/2012    Procedure: COLONOSCOPY;  Surgeon: Rogene Houston, MD;  [facility];  Service: Endoscopy;  Laterality: N/A;  130  . Esophagogastroduodenoscopy (egd) with esophageal dilation N/A 02/12/2013    Procedure: ESOPHAGOGASTRODUODENOSCOPY (EGD) WITH ESOPHAGEAL DILATION;  Surgeon: Rogene Houston, MD;  [facility];  Service: Endoscopy;  Laterality: N/A;  . Breast surgery      lumpectomy - noncancerous  . Eye surgery Bilateral     cataract surgery   . Clavicle surgery Left   . Vaginal hysterectomy    . Kyphoplasty N/A 06/16/2013    Procedure: Thoracic Nine Kyphoplasty;  Surgeon: Ophelia Charter, MD;  Location: [employer] NEURO ORS;  Service: Neurosurgery;  Laterality: N/A;  Thoracic Nine Kyphoplasty  . Back surgery    . Esophagogastroduodenoscopy N/A 07/05/2013    Procedure: ESOPHAGOGASTRODUODENOSCOPY (EGD) FB removal and esophageal  dilation;  Surgeon: Rogene Houston, MD;  [facility];  Service: Endoscopy;  Laterality: N/A;   History reviewed. No pertinent family history. History  Substance Use Topics  . Smoking status: Former Smoker -- 1.00 packs/day for 40 years    Quit date: 08/25/1971  . Smokeless tobacco: Never Used  . Alcohol Use: No   OB History   Grav Para Term Preterm Abortions TAB SAB Ect Mult Living                 Review of Systems  Constitutional: Negative for fever and chills.  HENT: Positive for trouble swallowing.         Positive for pain with swallowing.   Respiratory: Negative for shortness of breath.   All other systems reviewed and are negative.     Allergies  Penicillins  Home Medications   Prior to Admission medications   Medication Sig Start Date End Date Taking? Authorizing Provider  acetaminophen (TYLENOL) 500 MG tablet Take 1,000 mg by mouth every 6 (six) hours as needed. Pain    Historical Provider, MD  atorvastatin (LIPITOR) 10 MG tablet Take 1 tablet (10 mg total) by mouth daily at 6 PM. 06/21/13   Radene Gunning, NP  Cholecalciferol (VITAMIN D) 400 UNITS capsule Take 400 Units by mouth daily.      Historical Provider, MD  cyanocobalamin (,VITAMIN B-12,) 1000 MCG/ML injection Inject 1 mL (1,000 mcg total) into the muscle every 30 (thirty) days. 04/19/13   Kathyrn Drown, MD  fludrocortisone (FLORINEF) 0.1 MG tablet Take 0.1 mg by mouth daily.    Historical Provider, MD  folic acid (FOLVITE) 1 MG tablet Take 1 tablet (1 mg total) by mouth daily. 06/21/13   Radene Gunning, NP  levothyroxine (SYNTHROID, LEVOTHROID) 75 MCG tablet Take 1 tablet (75 mcg total) by mouth daily before breakfast. 04/21/13   Kathyrn Drown, MD  loperamide (IMODIUM A-D) 2 MG tablet Take 2 mg by mouth daily as needed for diarrhea or loose stools.    Historical Provider, MD  megestrol (MEGACE) 20 MG tablet Take 20 mg by mouth daily.    Historical Provider, MD  omeprazole (PRILOSEC) 20 MG capsule Take 1 capsule (20 mg total) by mouth daily. 04/26/13   Kathyrn Drown, MD  oxyCODONE-acetaminophen (PERCOCET/ROXICET) 5-325 MG per tablet Take 1-2 tablets by mouth every 4 (four) hours as needed for moderate pain. 06/17/13   Ophelia Charter, MD  predniSONE (DELTASONE) 10 MG tablet Take 1 tablet (10 mg total) by mouth daily. For treatment of adrenal insufficiency. 03/25/13   Rexene Alberts, MD  sertraline (ZOLOFT) 25 MG tablet Take 1 tablet (25 mg total) by mouth daily. 04/01/13   Kathyrn Drown, MD  thiamine 100 MG tablet Take 1 tablet (100 mg  total) by mouth daily. 06/21/13   Radene Gunning, NP   Triage Vitals: BP 129/64  Pulse 80  Temp(Src) 98.7 F (37.1 C)  Resp 16  Ht 5\' 3"  (1.6 m)  Wt 87 lb (39.463 kg)  BMI 15.42 kg/m2  SpO2 99% Physical Exam CONSTITUTIONAL: Well developed/well nourished HEAD: Normocephalic/atraumatic EYES: EOMI/PERRL ENMT: Mucous membranes moist, no stridor, patient handling secretions but does spit up frequently NECK: supple no meningeal signs CV: S1/S2 noted, no murmurs/rubs/gallops noted LUNGS: Lungs are clear to auscultation bilaterally, no apparent distress ABDOMEN: soft, nontender, no rebound or guarding NEURO: Pt is awake/alert, moves all extremitiesx4 EXTREMITIES: full ROM SKIN: warm, color normal PSYCH: no abnormalities of mood  noted  ED Course  Procedures  COORDINATION OF CARE:  3:43 PM- Patient informed of current plan for treatment and evaluation and agrees with plan at this time.   4:01 PM D/w dr Gala Romney Will see patient and likely EGD today for recurrent food obstruction Pt agreeable with plan  Labs Review Labs Reviewed  CBC WITH DIFFERENTIAL - Abnormal; Notable for the following:    WBC 11.4 (*)    RBC 3.65 (*)    Hemoglobin 11.4 (*)    Monocytes Absolute 1.1 (*)    All other components within normal limits  BASIC METABOLIC PANEL - Abnormal; Notable for the following:    Potassium 3.2 (*)    GFR calc non Af Amer 75 (*)    GFR calc Af Amer 87 (*)    All other components within normal limits    Imaging Review Dg Chest 2 View  07/15/2013   CLINICAL DATA:  airway obstruction and dysphagia  EXAM: CHEST  2 VIEW  COMPARISON:  Jul 05, 2008  FINDINGS: There is underlying emphysematous change. There is diffuse interstitial prominence consistent with underlying chronic inflammatory type change. There is rather extensive but stable apical pleural thickening bilaterally. A small granuloma in the left mid lung is stable. There is no frank edema or consolidation. The heart size is  within normal limits. Pulmonary vascularity reflects underlying emphysema. No adenopathy. Pacemaker leads are attached to the right atrium and right ventricle. There is extensive coronary artery calcification.  Patient has had a previous kyphoplasty procedure in the mid thoracic region. Snare is stable anterior wedging of this mid thoracic vertebral body.  IMPRESSION: Underlying emphysematous change with chronic inflammatory type change in the lungs. No edema or consolidation. There is stable bilateral apical pleural thickening.   Electronically Signed   By: Lowella Grip M.D.   On: 07/15/2013 12:20      MDM   Final diagnoses:  Esophageal obstruction    Nursing notes including past medical history and social history reviewed and considered in documentation Labs/vital reviewed and considered Previous records reviewed and considered xrays reviewed and considered   I personally performed the services described in this documentation, which was scribed in my presence. The recorded information has been reviewed and is accurate.      Sharyon Cable, MD 07/15/13 [phone]

## 2013-07-15 NOTE — Discharge Instructions (Signed)
EGD Discharge instructions Please read the instructions outlined below and refer to this sheet in the next few weeks. These discharge instructions provide you with general information on caring for yourself after you leave the hospital. Your doctor may also give you specific instructions. While your treatment has been planned according to the most current medical practices available, unavoidable complications occasionally occur. If you have any problems or questions after discharge, please call your doctor. ACTIVITY  You may resume your regular activity but move at a slower pace for the next 24 hours.   Take frequent rest periods for the next 24 hours.   Walking will help expel (get rid of) the air and reduce the bloated feeling in your abdomen.   No driving for 24 hours (because of the anesthesia (medicine) used during the test).   You may shower.   Do not sign any important legal documents or operate any machinery for 24 hours (because of the anesthesia used during the test).  NUTRITION  Drink plenty of fluids.   You may resume your normal diet.   Begin with a light meal and progress to your normal diet.   Avoid alcoholic beverages for 24 hours or as instructed by your caregiver.  MEDICATIONS  You may resume your normal medications unless your caregiver tells you otherwise.  WHAT YOU CAN EXPECT TODAY  You may experience abdominal discomfort such as a feeling of fullness or gas pains.  FOLLOW-UP  Your doctor will discuss the results of your test with you.  SEEK IMMEDIATE MEDICAL ATTENTION IF ANY OF THE FOLLOWING OCCUR:  Excessive nausea (feeling sick to your stomach) and/or vomiting.   Severe abdominal pain and distention (swelling).   Trouble swallowing.   Temperature over 101 F (37.8 C).   Rectal bleeding or vomiting of blood.    Strict liquid diet at least until you have your manometry done week after next. (A strict liquid diet means no [place], bread, chicken,  vegetable or fruit for now). Would limit diet to [employer] breakfast or Ensure; Stay upright for 1 hour after a meal and medication ingestion. Swallow adequate liquids to assist with swallowing  Will be in touch next week regarding the pathology report  Keep appointment in [facility] for June 8

## 2013-07-15 NOTE — H&P (Signed)
@LOGO @   Primary Care Physician:  Sallee Lange, MD Primary Gastroenterologist:  Dr. Gala Romney  Pre-Procedure History & Physical: HPI:  Kaitlyn Cooper is a 80 y.o. female here for for evaluation of possible food impaction. Emergency department doctor, Dr. Christy Gentles, evaluated the patient and called me. Apparently, regular food ingested earlier this week .  Beginning about 2 days ago  AFTER EATING A [place] SANDWICH; hasn't been able to swallow even her saliva since that time.. Patient seen on May 19 by Dr. Laural Golden.  EGD at that time demonstrated food debris/stasis within a dilated esophagus along with a narrowed GE junction, distal esophageal diverticulum by description. GE junction balloon dilated. Esophagus cleared. She was advised to stay on a full liquid diet. Manometry arranged with Dr. Ardis Hughs on June 8.    Past Medical History  Diagnosis Date  . Anemia     followed by Dr.Neijstrom  . Hyperlipidemia   . Hypothyroidism   . History of atherosclerotic cardiovascular disease   . History of colonoscopy     02/2002 showed few diverticula at sigmoid colon otherwise normal colonoscopy and terminal ileoscopy 1995 several colon polyps ,tubular adenoma  . GERD (gastroesophageal reflux disease)     chronic  . GERD (gastroesophageal reflux disease)     chronic gerd  . Tiredness   . T9 vertebral fracture 02/09/13    result of fall at home  . Adrenal insufficiency 03/23/2013  . Orthostatic hypotension 03/21/2013  . Pacemaker   . Heart murmur     "years ago"  . COPD (chronic obstructive pulmonary disease)   . Pneumonia   . Depression     takes Zoloft  . HYPERLIPIDEMIA 10/24/2008    Qualifier: Diagnosis of  By: Dwaine Gale, CNA, [place]    . C. difficile colitis 06/21/2013    Past Surgical History  Procedure Laterality Date  . Insert / replace / remove pacemaker  01/17/2009    [employer]ude Cardiac Pacemaker   . Cardiac catheterization  1998  . Cholecystectomy    . Temporal carcinoma  2004    excision of  right temporal carcinoma   . Esophagogastroduodenoscopy (egd) with esophageal dilation  03/22/2012    Procedure: ESOPHAGOGASTRODUODENOSCOPY (EGD) WITH ESOPHAGEAL DILATION;  Surgeon: Rogene Houston, MD;  [facility];  Service: Endoscopy;  Laterality: N/A;  730  . Colonoscopy N/A 12/29/2012    Procedure: COLONOSCOPY;  Surgeon: Rogene Houston, MD;  [facility];  Service: Endoscopy;  Laterality: N/A;  130  . Esophagogastroduodenoscopy (egd) with esophageal dilation N/A 02/12/2013    Procedure: ESOPHAGOGASTRODUODENOSCOPY (EGD) WITH ESOPHAGEAL DILATION;  Surgeon: Rogene Houston, MD;  [facility];  Service: Endoscopy;  Laterality: N/A;  . Breast surgery      lumpectomy - noncancerous  . Eye surgery Bilateral     cataract surgery   . Clavicle surgery Left   . Vaginal hysterectomy    . Kyphoplasty N/A 06/16/2013    Procedure: Thoracic Nine Kyphoplasty;  Surgeon: Ophelia Charter, MD;  Location: [place] NEURO ORS;  Service: Neurosurgery;  Laterality: N/A;  Thoracic Nine Kyphoplasty  . Back surgery    . Esophagogastroduodenoscopy N/A 07/05/2013    Procedure: ESOPHAGOGASTRODUODENOSCOPY (EGD) FB removal and esophageal dilation;  Surgeon: Rogene Houston, MD;  [facility];  Service: Endoscopy;  Laterality: N/A;    Prior to Admission medications   Medication Sig Start Date End Date Taking? Authorizing Provider  acetaminophen (TYLENOL) 500 MG tablet Take 1,000 mg by mouth every 6 (  six) hours as needed. Pain    Historical Provider, MD  atorvastatin (LIPITOR) 10 MG tablet Take 1 tablet (10 mg total) by mouth daily at 6 PM. 06/21/13   Radene Gunning, NP  Cholecalciferol (VITAMIN D) 400 UNITS capsule Take 400 Units by mouth daily.      Historical Provider, MD  cyanocobalamin (,VITAMIN B-12,) 1000 MCG/ML injection Inject 1 mL (1,000 mcg total) into the muscle every 30 (thirty) days. 04/19/13   Kathyrn Drown, MD  fludrocortisone (FLORINEF) 0.1 MG tablet Take 0.1 mg by mouth  daily.    Historical Provider, MD  folic acid (FOLVITE) 1 MG tablet Take 1 tablet (1 mg total) by mouth daily. 06/21/13   Radene Gunning, NP  levothyroxine (SYNTHROID, LEVOTHROID) 75 MCG tablet Take 1 tablet (75 mcg total) by mouth daily before breakfast. 04/21/13   Kathyrn Drown, MD  loperamide (IMODIUM A-D) 2 MG tablet Take 2 mg by mouth daily as needed for diarrhea or loose stools.    Historical Provider, MD  megestrol (MEGACE) 20 MG tablet Take 20 mg by mouth daily.    Historical Provider, MD  omeprazole (PRILOSEC) 20 MG capsule Take 1 capsule (20 mg total) by mouth daily. 04/26/13   Kathyrn Drown, MD  oxyCODONE-acetaminophen (PERCOCET/ROXICET) 5-325 MG per tablet Take 1-2 tablets by mouth every 4 (four) hours as needed for moderate pain. 06/17/13   Ophelia Charter, MD  predniSONE (DELTASONE) 10 MG tablet Take 1 tablet (10 mg total) by mouth daily. For treatment of adrenal insufficiency. 03/25/13   Rexene Alberts, MD  sertraline (ZOLOFT) 25 MG tablet Take 1 tablet (25 mg total) by mouth daily. 04/01/13   Kathyrn Drown, MD  thiamine 100 MG tablet Take 1 tablet (100 mg total) by mouth daily. 06/21/13   Radene Gunning, NP    Allergies as of 07/15/2013 - Review Complete 07/15/2013  Allergen Reaction Noted  . Penicillins Rash 01/17/2009    History reviewed. No pertinent family history.  History   Social History  . Marital Status: Widowed    Spouse Name: N/A    Number of Children: N/A  . Years of Education: N/A   Occupational History  . Not on file.   Social History Main Topics  . Smoking status: Former Smoker -- 1.00 packs/day for 40 years    Quit date: 08/25/1971  . Smokeless tobacco: Never Used  . Alcohol Use: No  . Drug Use: No  . Sexual Activity: Not on file   Other Topics Concern  . Not on file   Social History Narrative  . No narrative on file    Review of Systems: See HPI, otherwise negative ROS  Physical Exam: BP 129/64  Pulse 80  Temp(Src) 98.7 F (37.1 C)  Resp 16   Ht 5\' 3"  (1.6 m)  Wt 87 lb (39.463 kg)  BMI 15.42 kg/m2  SpO2 99% General:   Alert,  pleasant, somewhat frail 80 year old lady. Pleasant conversant. Appears in no acute distress. Skin:  Intact without significant lesions or rashes. Eyes:  Sclera clear, no icterus.   Conjunctiva pink. Ears:  Normal auditory acuity. Nose:  No deformity, discharge,  or lesions. Mouth:  No deformity or lesions. Neck:  Supple; no masses or thyromegaly. No significant cervical adenopathy. Lungs:  Clear throughout to auscultation.   No wheezes, crackles, or rhonchi. No acute distress. Heart:  Regular rate and rhythm; no murmurs, clicks, rubs,  or gallops. Abdomen: Non-distended, normal bowel sounds.  Soft and  nontender without appreciable mass or hepatosplenomegaly.  Pulses:  Normal pulses noted. Extremities:  Without clubbing or edema.  Impression:    Pleasant 80 year old lady presents with signs symptoms consistent with a food impaction. Based on a recent endoscopic findings, would have to wonder if she does not have achalasia. I suspect her esophagus could not handle the food challenge 2 days ago. I suspect she does have a food impaction.  Recommendations:  Urgent EGD for disimpaction as appropriate. If she has food debris in her esophagus/stomach, in all likelihood I would not be performing a dilation today. I explained to her that our goal today would be to clear her esophagus. I further explained to her that she will need to stay on a strict liquid diet (i.e. Ensure, [employer] instant breakfast) until her evaluation can be completed. She voices understanding to my recommendations.  Further recommendations to follow.     Notice: This dictation was prepared with Dragon dictation along with smaller phrase technology. Any transcriptional errors that result from this process are unintentional and may not be corrected upon review.

## 2013-07-15 NOTE — Telephone Encounter (Signed)
The pt was advised that the mano could not be changed, [facility] has no sooner appt available.  She will call her primary GI for suggestions on what to do in the mean time.

## 2013-07-15 NOTE — ED Notes (Addendum)
Patient attempting to eat Wednesday at lunch since then unable to swallow anything. Pt was recently seen here regarding same complaint. Able to control saliva, no acute resp distress

## 2013-07-15 NOTE — Op Note (Addendum)
[facility] [address] [place], [address]   ENDOSCOPY PROCEDURE REPORT  PATIENT: Kaitlyn Cooper, Kaitlyn Cooper  MR#: [id number] BIRTHDATE: 1926/07/09 , 89  yrs. old GENDER: Female ENDOSCOPIST: R.  Garfield Cornea, MD FACP [facility] REFERRED BY:  Owens Loffler, M.D.  Sallee Lange, M.D.  Hildred Laser, M.D. PROCEDURE DATE:  07/15/2013 PROCEDURE:     Emergency EGD with food impaction removal  INDICATIONS:     Food impaction  INFORMED CONSENT:   The risks, benefits, limitations, alternatives and imponderables have been discussed.  The potential for biopsy, esophogeal dilation, etc. have also been reviewed.  Questions have been answered.  All parties agreeable.  Please see the history and physical in the medical record for more information.  MEDICATIONS:     Versed 2 mg IV and Demerol 50 mg IV in divided doses. Xylocaine gel orally, Zofran 4 mg a  DESCRIPTION OF PROCEDURE:   The [id number] ([id number])  endoscope was introduced through the mouth and advanced to the second portion of the duodenum without difficulty or limitations.  The mucosal surfaces were surveyed very carefully during advancement of the scope and upon withdrawal.  Retroflexion view of the proximal stomach and esophagogastric junction was performed.      FINDINGS:  some food debris lining the tubular esophagus. A single food bolus lodged at the GE junction noted. The previously noted distal esophageal diverticulum also noted.  The esophagus was not dilated. It appeared to have prominent muscular rings with superimposed superficial mucosal rings giving a circular "onion ring" appearance to the mucosa diffusely.  The ring at the GE junction appeared to be a bit more critical obstructing the passage of the food bolus.  Endoscopically, the esophagus appeared to have pretty good peristalsis.  THERAPEUTIC / DIAGNOSTIC MANEUVERS PERFORMED:  The tip of the double channel scope was approximated against the food  impaction and gentle pressure was applied. This resulted in the food bolus dropping into the stomach.  It was quite a bit of food debris in the hernia sac which measured approximately 5 cm; I did not attempt to completeexamination of her upper GI tract. Subsequently I elected to go ahead and biopsy the mid and distal esophagus. This was done without difficulty or apparent complication.   COMPLICATIONS:  None  IMPRESSION:   Esophageal food impaction-status post disimpaction as described above. Abnormal-appearing esophagus. Appearance not consistent with achalasia.  Esophagus looked more like eosinophilic esophagitis-but not text book.  Status post esophageal biopsy. Distal esophageal diverticulum. Hiatal hernia. EGD incomplete per plan. No esophageal dilation performed.    RECOMMENDATIONS:  Strict liquid diet. Patient instructed in some detail. Swallowing precautions reviewed (see discharge instructions) Followup on pathology. Patient is to keep her appointment for manometry June 8 pending review of pathology report    _______________________________ R. Garfield Cornea, MD FACP [facility] eSigned:  R. Garfield Cornea, MD FACP [facility] 07/15/2013 4:55 PM Revised: 07/15/2013 4:55 PM    CC:  PATIENT NAME:  Anakaren, Campion MR#: [id number]

## 2013-07-18 ENCOUNTER — Encounter (HOSPITAL_COMMUNITY): Payer: Self-pay | Admitting: Internal Medicine

## 2013-07-25 ENCOUNTER — Encounter (HOSPITAL_COMMUNITY)
Admission: RE | Disposition: A | Discharge status: Home / Self Care | Payer: Self-pay | Source: Ambulatory Visit | Attending: Gastroenterology

## 2013-07-25 ENCOUNTER — Ambulatory Visit (HOSPITAL_COMMUNITY)
Admission: RE | Admit: 2013-07-25 | Discharge: 2013-07-25 | Disposition: A | Discharge status: Home / Self Care | Payer: Medicare Other | Source: Ambulatory Visit | Attending: Gastroenterology | Admitting: Gastroenterology

## 2013-07-25 DIAGNOSIS — K222 Esophageal obstruction: Secondary | ICD-10-CM | POA: Insufficient documentation

## 2013-07-25 DIAGNOSIS — IMO0002 Reserved for concepts with insufficient information to code with codable children: Secondary | ICD-10-CM | POA: Insufficient documentation

## 2013-07-25 DIAGNOSIS — Y929 Unspecified place or not applicable: Secondary | ICD-10-CM | POA: Insufficient documentation

## 2013-07-25 DIAGNOSIS — T18108A Unspecified foreign body in esophagus causing other injury, initial encounter: Secondary | ICD-10-CM | POA: Insufficient documentation

## 2013-07-25 DIAGNOSIS — R1310 Dysphagia, unspecified: Secondary | ICD-10-CM

## 2013-07-25 HISTORY — PX: ESOPHAGEAL MANOMETRY: SHX5429

## 2013-07-25 SURGERY — MANOMETRY, ESOPHAGUS
Anesthesia: Choice

## 2013-07-25 MED ORDER — LIDOCAINE VISCOUS 2 % MT SOLN
OROMUCOSAL | Status: AC
  Filled 2013-07-25: qty 15

## 2013-07-25 SURGICAL SUPPLY — 1 items: FACESHIELD LNG OPTICON STERILE (SAFETY) IMPLANT

## 2013-07-26 ENCOUNTER — Encounter (HOSPITAL_COMMUNITY): Payer: Self-pay | Admitting: Gastroenterology

## 2013-08-01 ENCOUNTER — Encounter: Payer: Self-pay | Admitting: Family Medicine

## 2013-08-01 ENCOUNTER — Encounter: Payer: Self-pay | Admitting: *Deleted

## 2013-08-01 ENCOUNTER — Ambulatory Visit (INDEPENDENT_AMBULATORY_CARE_PROVIDER_SITE_OTHER): Payer: Medicare Other | Admitting: Family Medicine

## 2013-08-01 VITALS — BP 100/58 | Ht 63.0 in | Wt 84.8 lb

## 2013-08-01 DIAGNOSIS — H8110 Benign paroxysmal vertigo, unspecified ear: Secondary | ICD-10-CM

## 2013-08-01 DIAGNOSIS — E41 Nutritional marasmus: Secondary | ICD-10-CM

## 2013-08-01 DIAGNOSIS — I2510 Atherosclerotic heart disease of native coronary artery without angina pectoris: Secondary | ICD-10-CM

## 2013-08-01 DIAGNOSIS — E43 Unspecified severe protein-calorie malnutrition: Secondary | ICD-10-CM

## 2013-08-01 NOTE — Progress Notes (Signed)
   Subjective:    Patient ID: Kaitlyn Cooper, female    DOB: 1926/09/24, 80 y.o.   MRN: [id number]  HPI  Patient arrives with complaint of dizziness for 4-5 days.  Patient reports it seemed to have come on all the sudden-feels swimmy headed but no other sx. Previous notes from gastroenterology reviewed. Manometry test result pending. Patient not taking in much liquids or food. Review of Systems PMH benign, significant dysphagia issues lately.    Objective:   Physical Exam  Lungs are clear hearts regular pulse normal weight is down 4 pounds      Assessment & Plan:  Significant weight loss poor calorie intake she is undergoing evaluation for achalasia  She might end up having to use a feeding tube but patient is unlikely to do that. She relates that at times she feels like she would like to die. I do not feel she is depressed I just think she is just tired of dealing with all of the end of  life issues

## 2013-08-03 ENCOUNTER — Telehealth: Payer: Self-pay | Admitting: Gastroenterology

## 2013-08-04 NOTE — Telephone Encounter (Signed)
Pt aware we have not received the results yet, but she will be called as soon as reviewed

## 2013-08-08 ENCOUNTER — Ambulatory Visit (INDEPENDENT_AMBULATORY_CARE_PROVIDER_SITE_OTHER): Payer: Medicare Other | Admitting: Family Medicine

## 2013-08-08 ENCOUNTER — Encounter: Payer: Self-pay | Admitting: Family Medicine

## 2013-08-08 VITALS — BP 102/58

## 2013-08-08 DIAGNOSIS — R634 Abnormal weight loss: Secondary | ICD-10-CM

## 2013-08-08 DIAGNOSIS — I951 Orthostatic hypotension: Secondary | ICD-10-CM

## 2013-08-08 DIAGNOSIS — I2510 Atherosclerotic heart disease of native coronary artery without angina pectoris: Secondary | ICD-10-CM

## 2013-08-08 DIAGNOSIS — R5381 Other malaise: Secondary | ICD-10-CM

## 2013-08-08 DIAGNOSIS — R5383 Other fatigue: Secondary | ICD-10-CM

## 2013-08-08 DIAGNOSIS — R531 Weakness: Secondary | ICD-10-CM

## 2013-08-08 NOTE — Progress Notes (Signed)
   Subjective:    Patient ID: Kaitlyn Cooper, female    DOB: August 07, 1926, 80 y.o.   MRN: [id number]  HPIFell last week. Hurt right hip.   Dizziness started about 1 week ago. Dizziness started before the fall.  She describes the dizziness as intermittent spells of feeling dizzy. She's not eating enough o her urine tends to be dark yellow. Not drinking enough  patient has history of achalasia. No fevers. Awaiting the results of the tests that seems to be taking a while to get back     Review of Systems  denies fevers vomiting diarrhea.    Objective:   Physical Exam Lungs are clear heart is regular pulse normal skin warm dry no extremity pain or hip pain currently    medications were verified. Family [phone]    Assessment & Plan:  Patient is eating very poorly. They have her on a liquid diet because of significant issues of achalasia I believe that she would be a candidate for gastrointestinal PEG tube.  Once the results of this specialized test is back then we can proceed forward. We will look into trying to track down the results.

## 2013-08-09 ENCOUNTER — Telehealth (INDEPENDENT_AMBULATORY_CARE_PROVIDER_SITE_OTHER): Payer: Self-pay | Admitting: *Deleted

## 2013-08-09 ENCOUNTER — Telehealth: Payer: Self-pay

## 2013-08-09 ENCOUNTER — Telehealth: Payer: Self-pay | Admitting: Family Medicine

## 2013-08-09 NOTE — Telephone Encounter (Signed)
I am  At the mercy of a  imperfect system. I am waiting on the specialist to read this test. Please nurses-call Dr. Owens Loffler gastroenterologist [place] office. Speak with his nurse. Left and that this patient had a manometry test over 2 weeks ago and it is highly important we get the test results as soon as possible. This patient continues to lose weight because she cannot eat solids. We must have test results please find out we'll may be back within the next 48 hours?! Inform the family of this!!

## 2013-08-09 NOTE — Telephone Encounter (Signed)
[name]'s sister, Kaitlyn Cooper, would like to get results of a test that was done in [place]. Dr. Ardis Hughs told them our office would have to give them to her. Kaitlyn Cooper is very concerned, [name] is at 84 lbs with head spinning. The return phone number is [phone].

## 2013-08-09 NOTE — Telephone Encounter (Signed)
I have not seen these results. Dr. Hilarie Fredrickson was going to read the manometry.

## 2013-08-09 NOTE — Telephone Encounter (Signed)
mano read and emailed to all parties Patty -- please fax a copy to Dr. Sallee Lange today.  Thanks

## 2013-08-09 NOTE — Telephone Encounter (Signed)
Pt aware the results have been sent to the referring

## 2013-08-09 NOTE — Telephone Encounter (Signed)
Kaitlyn Cooper has presented to the office to get results. We do not have them , but will try and get them. Dr.Rehman aware.

## 2013-08-09 NOTE — Telephone Encounter (Signed)
Calling to check on Esophageal manometry test results that she had done. Dr. Olevia Perches office is not answering the phone and her sister said that at the last visit Dr. Nicki Reaper said that he would get the results.

## 2013-08-09 NOTE — Telephone Encounter (Signed)
Specialist notified patient of results! Pt aware. There are several telephone messages regarding this. A copy of the results are being faxed to [place].

## 2013-08-09 NOTE — Telephone Encounter (Signed)
Have you reviewed the manometry results?

## 2013-08-09 NOTE — Telephone Encounter (Signed)
Dr Hilarie Fredrickson have you reviewed this mano?

## 2013-08-10 ENCOUNTER — Telehealth: Payer: Self-pay | Admitting: Family Medicine

## 2013-08-10 ENCOUNTER — Other Ambulatory Visit (INDEPENDENT_AMBULATORY_CARE_PROVIDER_SITE_OTHER): Payer: Self-pay | Admitting: Internal Medicine

## 2013-08-10 MED ORDER — IMIPRAMINE HCL 10 MG PO TABS: 10.0000 mg | ORAL_TABLET | Freq: Every day | ORAL | Status: AC

## 2013-08-10 MED ORDER — MECLIZINE HCL 25 MG PO TABS: 25.0000 mg | ORAL_TABLET | Freq: Three times a day (TID) | ORAL | Status: DC | PRN

## 2013-08-10 NOTE — Addendum Note (Signed)
Addended by: Dairl Ponder on: 08/10/2013 05:17 PM   Modules accepted: Orders

## 2013-08-10 NOTE — Telephone Encounter (Signed)
Patient was seen on 6/22 and now sisiter thinks she has inner ear not dizzy any more. Can you call in something to [facility]

## 2013-08-10 NOTE — Telephone Encounter (Signed)
Med sent electronically to pharmacy. Discussed with sister and advised med may cause drowsiness and use fall precautions.

## 2013-08-10 NOTE — Telephone Encounter (Signed)
Meclizine 25 mg tid prn caurtion drow

## 2013-08-11 NOTE — Telephone Encounter (Signed)
Patient's sister Tessie Fass) notified and verbalized understanding. She was thankful.

## 2013-08-11 NOTE — Telephone Encounter (Signed)
Please let the sister,Kaitlyn Cooper, who helps take care of Kaitlyn Cooper know that I spoke with Dr. Melony Overly yesterday. He states that it is possible that the Botox injection might help her. He is discussing the case with the specialist at [facility]. He states it often takes a few days for the specialist to reconnect with him. He hopes to no more by the first of next week. His office should be calling her to inform her of all of this. If that does not work then the next step is PEG tube with nighttime feedings. Left the family know that I stressed to Dr. Laural Golden that the situation is getting worse

## 2013-08-15 ENCOUNTER — Telehealth (INDEPENDENT_AMBULATORY_CARE_PROVIDER_SITE_OTHER): Payer: Self-pay | Admitting: *Deleted

## 2013-08-15 ENCOUNTER — Telehealth (INDEPENDENT_AMBULATORY_CARE_PROVIDER_SITE_OTHER): Payer: Self-pay | Admitting: Internal Medicine

## 2013-08-15 NOTE — Telephone Encounter (Signed)
I received a call from Ms. Laureen Abrahams, [place] who works with Dr. Bobbye Morton. He is on vacation. She indicated that they have injected Botox situations like this with good results. I have contacted patient's sister Ms. Pike. Patient remains with dysphagia. Therefore will proceed with EGD with Botox injection to distal esophagus

## 2013-08-15 NOTE — Telephone Encounter (Signed)
Per Dr.Rehman - Arrange for patient to have EGD/Botox injection this week.

## 2013-08-16 ENCOUNTER — Other Ambulatory Visit (INDEPENDENT_AMBULATORY_CARE_PROVIDER_SITE_OTHER): Payer: Self-pay | Admitting: *Deleted

## 2013-08-16 DIAGNOSIS — R1310 Dysphagia, unspecified: Secondary | ICD-10-CM

## 2013-08-16 NOTE — Telephone Encounter (Signed)
Patient scheduled for 08/18/2013 at 10:30 NPO arrive at 9:30 scheduled with Hoyle Sauer at [facility] stay.  Called patient sister Tessie Fass to give instructions and was appreciative.  Gave her my number to call if she had questions.

## 2013-08-16 NOTE — Telephone Encounter (Signed)
Thanks for arranging EGD.

## 2013-08-18 ENCOUNTER — Ambulatory Visit (HOSPITAL_COMMUNITY): Payer: Medicare Other

## 2013-08-18 ENCOUNTER — Encounter (HOSPITAL_COMMUNITY): Payer: Self-pay | Admitting: *Deleted

## 2013-08-18 ENCOUNTER — Ambulatory Visit (HOSPITAL_COMMUNITY)
Admission: RE | Admit: 2013-08-18 | Discharge: 2013-08-18 | Disposition: A | Discharge status: Home / Self Care | Payer: Medicare Other | Source: Ambulatory Visit | Attending: Internal Medicine | Admitting: Internal Medicine

## 2013-08-18 ENCOUNTER — Encounter (HOSPITAL_COMMUNITY)
Admission: RE | Disposition: A | Discharge status: Home / Self Care | Payer: Self-pay | Source: Ambulatory Visit | Attending: Internal Medicine

## 2013-08-18 DIAGNOSIS — Z87891 Personal history of nicotine dependence: Secondary | ICD-10-CM | POA: Insufficient documentation

## 2013-08-18 DIAGNOSIS — K219 Gastro-esophageal reflux disease without esophagitis: Secondary | ICD-10-CM | POA: Insufficient documentation

## 2013-08-18 DIAGNOSIS — K225 Diverticulum of esophagus, acquired: Secondary | ICD-10-CM

## 2013-08-18 DIAGNOSIS — R634 Abnormal weight loss: Secondary | ICD-10-CM | POA: Insufficient documentation

## 2013-08-18 DIAGNOSIS — Z79899 Other long term (current) drug therapy: Secondary | ICD-10-CM | POA: Insufficient documentation

## 2013-08-18 DIAGNOSIS — R1310 Dysphagia, unspecified: Secondary | ICD-10-CM

## 2013-08-18 DIAGNOSIS — J4489 Other specified chronic obstructive pulmonary disease: Secondary | ICD-10-CM | POA: Insufficient documentation

## 2013-08-18 DIAGNOSIS — E785 Hyperlipidemia, unspecified: Secondary | ICD-10-CM | POA: Insufficient documentation

## 2013-08-18 DIAGNOSIS — K224 Dyskinesia of esophagus: Secondary | ICD-10-CM

## 2013-08-18 DIAGNOSIS — D649 Anemia, unspecified: Secondary | ICD-10-CM | POA: Insufficient documentation

## 2013-08-18 DIAGNOSIS — J449 Chronic obstructive pulmonary disease, unspecified: Secondary | ICD-10-CM | POA: Insufficient documentation

## 2013-08-18 DIAGNOSIS — K449 Diaphragmatic hernia without obstruction or gangrene: Secondary | ICD-10-CM | POA: Insufficient documentation

## 2013-08-18 HISTORY — PX: ESOPHAGOGASTRODUODENOSCOPY: SHX5428

## 2013-08-18 HISTORY — DX: Dizziness and giddiness: R42

## 2013-08-18 SURGERY — EGD (ESOPHAGOGASTRODUODENOSCOPY)
Anesthesia: Moderate Sedation

## 2013-08-18 MED ORDER — SODIUM CHLORIDE 0.9 % IJ SOLN
INTRAMUSCULAR | Status: AC
  Filled 2013-08-18: qty 10

## 2013-08-18 MED ORDER — MEPERIDINE HCL 50 MG/ML IJ SOLN
INTRAMUSCULAR | Status: DC | PRN
  Administered 2013-08-18: 15 mg via INTRAVENOUS

## 2013-08-18 MED ORDER — ONABOTULINUMTOXINA 100 UNITS IJ SOLR
100.0000 [IU] | Freq: Once | INTRAMUSCULAR | Status: AC
  Administered 2013-08-18: 100 [IU] via INTRAMUSCULAR
  Filled 2013-08-18: qty 100

## 2013-08-18 MED ORDER — BUTAMBEN-TETRACAINE-BENZOCAINE 2-2-14 % EX AERO
INHALATION_SPRAY | CUTANEOUS | Status: DC | PRN
Start: 2013-08-18 — End: 2013-08-18
  Administered 2013-08-18: 2 via TOPICAL

## 2013-08-18 MED ORDER — SODIUM CHLORIDE 0.9 % IV SOLN
INTRAVENOUS | Status: DC
  Administered 2013-08-18: 10:00:00 via INTRAVENOUS

## 2013-08-18 MED ORDER — MEPERIDINE HCL 50 MG/ML IJ SOLN
INTRAMUSCULAR | Status: AC
  Filled 2013-08-18: qty 1

## 2013-08-18 MED ORDER — STERILE WATER FOR IRRIGATION IR SOLN
Status: DC | PRN
  Administered 2013-08-18: 11:00:00

## 2013-08-18 MED ORDER — MIDAZOLAM HCL 5 MG/5ML IJ SOLN
INTRAMUSCULAR | Status: DC | PRN
  Administered 2013-08-18 (×2): 0.5 mg via INTRAVENOUS
  Administered 2013-08-18 (×2): 1 mg via INTRAVENOUS

## 2013-08-18 MED ORDER — MIDAZOLAM HCL 5 MG/5ML IJ SOLN
INTRAMUSCULAR | Status: AC
  Filled 2013-08-18: qty 10

## 2013-08-18 NOTE — Op Note (Signed)
EGD PROCEDURE REPORT  PATIENT:  Kaitlyn Cooper  MR#:  [id number] Birthdate:  1926-06-08, 80 y.o., female Endoscopist:  Dr. Rogene Houston, MD Referred By:  Dr. Sallee Lange, MD Procedure Date: 08/18/2013  Procedure:   EGD with Botox injection to distal esophagus.  Indications:  Patient is 80 year old Caucasian female who was dysphagia affecting quality of her life and associated with weight loss. She has is the facial motility disorder and did not respond to low dose nitrates. High resolution manometry revealed hypertensive distal segment with DCI of greater than 7000. She is therefore undergoing therapeutic EGD.            Informed Consent:  The risks, benefits, alternatives & imponderables which include, but are not limited to, bleeding, infection, perforation, drug reaction and potential missed lesion have been reviewed.  The potential for biopsy, lesion removal, esophageal dilation, etc. have also been discussed.  Questions have been answered.  All parties agreeable.  Please see history & physical in medical record for more information.  Medications:  Demerol 15 mg IV Versed 3 mg IV Cetacaine spray topically for oropharyngeal anesthesia  Description of procedure:  The endoscope was introduced through the mouth and advanced to the second portion of the duodenum without difficulty or limitations. The mucosal surfaces were surveyed very carefully during advancement of the scope and upon withdrawal.  Findings:  Esophagus:  Mucosa of the proximal segment was normal. Scattered diverticula noted at distal esophagus with narrowed segment. Scope carefully maneuvered through this segment. GE junction unremarkable and patulous. GEJ:  33 cm Hiatus:  37 cm Stomach:  Stomach was empty other than small amount of bile in it. Stomach distended very well with insufflation. Folds in the proximal stomach were atrophic. Examination of mucosa at  body, antrum, pyloric channel, angularis fundus and cardia was  normal. Hernia easily identified on this view. Duodenum:  Normal bulbar and post bulbar mucosa.  Therapeutic/Diagnostic Maneuvers Performed:   Botox was injected at two levels into distal esophagus using sclerotherapy needle. Spastic segment was injected with Botox at 2 levels as below. Distally 20 units were injected into the muscle layer at 3 and 9' oclock. Second site was about 2 cm proximal to distal site; 20 units of Botox injected at each site x3.   Stomach was decompressed and endoscope was withdrawn.  Complications:  None  Impression: Esophageal motility disorder with esophageal diverticulum and hypertensive segment distally which was injected with Botox at two levels for total of 200 units. Moderate size sliding hiatal hernia.  Recommendations:  Standard instructions given. Patient will go back on soft diet. Patient's sister will call with progress report next week.  REHMAN,NAJEEB U  08/18/2013  11:07 AM  CC: Dr. Sallee Lange, MD & Dr. Rayne Du ref. provider found

## 2013-08-18 NOTE — H&P (Signed)
Kaitlyn Cooper is an 80 y.o. female.   Chief Complaint: Patient's here for EGD and Botox injection to the distal esophagus. HPI: Patient is a 12-year-old Caucasian female who has dysphagia to solids. She has been losing weight. She has esophageal motility disorder. She did not respond to low-dose nitrates. She has been losing weight. She underwent high-resolution manometry at [facility] and found to have nonspecific esophageal motility Sautter along with spasm in the distal esophagus with DCI in excess of 7000. She is therefore undergoing EGD with Botox injection to the segment hoping to improve her dysphagia.  Past Medical History  Diagnosis Date  . Anemia     followed by Dr.Neijstrom  . Hyperlipidemia   . Hypothyroidism   . History of atherosclerotic cardiovascular disease   . History of colonoscopy     02/2002 showed few diverticula at sigmoid colon otherwise normal colonoscopy and terminal ileoscopy 1995 several colon polyps ,tubular adenoma  . GERD (gastroesophageal reflux disease)     chronic  . GERD (gastroesophageal reflux disease)     chronic gerd  . Tiredness   . T9 vertebral fracture 02/09/13    result of fall at home  . Adrenal insufficiency 03/23/2013  . Orthostatic hypotension 03/21/2013  . Pacemaker   . Heart murmur     "years ago"  . COPD (chronic obstructive pulmonary disease)   . Pneumonia   . Depression     takes Zoloft  . HYPERLIPIDEMIA 10/24/2008    Qualifier: Diagnosis of  By: Dwaine Gale, CNA, [place]    . C. difficile colitis 06/21/2013  . Dizziness     Past Surgical History  Procedure Laterality Date  . Insert / replace / remove pacemaker  01/17/2009    [employer] Cardiac Pacemaker   . Cardiac catheterization  1998  . Cholecystectomy    . 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 Esophagogastroduodenoscopy (egd) with esophageal dilation  03/22/2012    Procedure: ESOPHAGOGASTRODUODENOSCOPY (EGD) WITH ESOPHAGEAL DILATION;  Surgeon: Rogene Houston,  MD;  [facility];  Service: Endoscopy;  Laterality: N/A;  730  . Colonoscopy N/A 12/29/2012    Procedure: COLONOSCOPY;  Surgeon: Rogene Houston, MD;  Location: [facility];  Service: Endoscopy;  Laterality: N/A;  130  . Esophagogastroduodenoscopy (egd) with esophageal dilation N/A 02/12/2013    Procedure: ESOPHAGOGASTRODUODENOSCOPY (EGD) WITH ESOPHAGEAL DILATION;  Surgeon: Rogene Houston, MD;  Location: [facility];  Service: Endoscopy;  Laterality: N/A;  . Breast surgery      lumpectomy - noncancerous  . Eye surgery Bilateral     cataract surgery   . Clavicle surgery Left   . Vaginal hysterectomy    . Kyphoplasty N/A 06/16/2013    Procedure: Thoracic Nine Kyphoplasty;  Surgeon: Ophelia Charter, MD;  Location: [facility];  Service: Neurosurgery;  Laterality: N/A;  Thoracic Nine Kyphoplasty  . Back surgery    . Esophagogastroduodenoscopy N/A 07/05/2013    Procedure: ESOPHAGOGASTRODUODENOSCOPY (EGD) FB removal and esophageal dilation;  Surgeon: Rogene Houston, MD;  [facility]: [facility];  Service: Endoscopy;  Laterality: N/A;  . Esophagogastroduodenoscopy N/A 07/15/2013    Procedure: ESOPHAGOGASTRODUODENOSCOPY (EGD);  Surgeon: Daneil Dolin, MD;  Location: [facility];  Service: Endoscopy;  Laterality: N/A;  . Esophageal manometry N/A 07/25/2013    Procedure: ESOPHAGEAL MANOMETRY (EM);  Surgeon: Milus Banister, MD;  Location: [facility];  Service: Endoscopy;  Laterality: N/A;    History reviewed. No  pertinent family history. Social History:  reports that she quit smoking about 42 years ago. She has never used smokeless tobacco. She reports that she does not drink alcohol or use illicit drugs.  Allergies:  Allergies  Allergen Reactions  . Penicillins Rash    Medications Prior to Admission  Medication Sig Dispense Refill  . acetaminophen (TYLENOL) 500 MG tablet Take 1,000 mg by mouth every 6 (six) hours as needed. Pain      . atorvastatin (LIPITOR) 10 MG tablet  Take 10 mg by mouth daily.      . Cholecalciferol (VITAMIN D) 400 UNITS capsule Take 400 Units by mouth daily.        . cyanocobalamin (,VITAMIN B-12,) 1000 MCG/ML injection Inject 1 mL (1,000 mcg total) into the muscle every 30 (thirty) days.  1 mL  5  . fludrocortisone (FLORINEF) 0.1 MG tablet Take 0.1 mg by mouth daily.      . folic acid (FOLVITE) 1 MG tablet Take 1 tablet (1 mg total) by mouth daily.      Marland Kitchen imipramine (TOFRANIL) 10 MG tablet Take 1 tablet (10 mg total) by mouth daily.  30 tablet  1  . levothyroxine (SYNTHROID, LEVOTHROID) 75 MCG tablet Take 1 tablet (75 mcg total) by mouth daily before breakfast.  30 tablet  5  . meclizine (ANTIVERT) 25 MG tablet Take 1 tablet (25 mg total) by mouth 3 (three) times daily as needed for dizziness. Caution drowsiness  30 tablet  0  . megestrol (MEGACE) 20 MG tablet Take 20 mg by mouth daily.      . midodrine (PROAMATINE) 5 MG tablet Take 5 mg by mouth 3 (three) times daily with meals.      Marland Kitchen omeprazole (PRILOSEC) 20 MG capsule Take 1 capsule (20 mg total) by mouth daily.  30 capsule  12  . oxyCODONE-acetaminophen (PERCOCET/ROXICET) 5-325 MG per tablet Take 1-2 tablets by mouth every 4 (four) hours as needed for moderate pain.  100 tablet  0  . predniSONE (DELTASONE) 10 MG tablet Take 1 tablet (10 mg total) by mouth daily. For treatment of adrenal insufficiency.  30 tablet  3  . sertraline (ZOLOFT) 25 MG tablet Take 1 tablet (25 mg total) by mouth daily.  30 tablet  6  . thiamine 100 MG tablet Take 1 tablet (100 mg total) by mouth daily.  30 tablet  0  . loperamide (IMODIUM A-D) 2 MG tablet Take 2 mg by mouth daily as needed for diarrhea or loose stools.        No results found for this or any previous visit (from the past 48 hour(s)). No results found.  ROS  Blood pressure 134/76, pulse 98, temperature 97.6 F (36.4 C), temperature source Oral, resp. rate 22, height 5\' 3"  (1.6 m), weight 84 lb (38.102 kg), SpO2 94.00%. Physical Exam   Constitutional:  Well-developed thin Caucasian female in NAD.  HENT:  Mouth/Throat: Oropharynx is clear and moist.  Scab at bridge of her nose  Eyes: Conjunctivae are normal.  Cardiovascular: Normal rate, regular rhythm and normal heart sounds.   No murmur heard. Respiratory: Effort normal and breath sounds normal.  GI: Soft. She exhibits no distension and no mass. There is no tenderness.  Musculoskeletal: She exhibits no edema.  Neurological: She is alert.  Skin: Skin is warm and dry.     Assessment/Plan Dysphagia secondary to esophageal motility disorder with hypertensive/spastic segment distally. EGD with Botox injection to hypertensive segment of distal esophagus. Procedure has  been reviewed with the patient and her daughter Ms. Stark Klein who has power of attorney. Informed consent obtained from Ms. Pike  Gianelle Mccaul U 08/18/2013, 10:22 AM

## 2013-08-18 NOTE — Discharge Instructions (Signed)
Resume usual medications and soft diet. Please call the office with progress report next week.   Esophagogastroduodenoscopy Care After Refer to this sheet in the next few weeks. These instructions provide you with information on caring for yourself after your procedure. Your caregiver may also give you more specific instructions. Your treatment has been planned according to current medical practices, but problems sometimes occur. Call your caregiver if you have any problems or questions after your procedure.  HOME CARE INSTRUCTIONS  Do not eat or drink anything until the numbing medicine (local anesthetic) has worn off and your gag reflex has returned. You will know that the local anesthetic has worn off when you can swallow comfortably.  Do not drive for 12 hours after the procedure or as directed by your caregiver.  Only take medicines as directed by your caregiver. SEEK MEDICAL CARE IF:   You cannot stop coughing.  You are not urinating at all or less than usual. SEEK IMMEDIATE MEDICAL CARE IF:  You have difficulty swallowing.  You cannot eat or drink.  You have worsening throat or chest pain.  You have dizziness, lightheadedness, or you faint.  You have nausea or vomiting.  You have chills.  You have a fever.  You have severe abdominal pain.  You have black, tarry, or bloody stools. Document Released: 01/21/2012 Document Reviewed: 01/21/2012 [facility] Patient Information 2015 [place]. This information is not intended to replace advice given to you by your health care provider. Make sure you discuss any questions you have with your health care provider.

## 2013-08-23 ENCOUNTER — Encounter (HOSPITAL_COMMUNITY): Payer: Self-pay | Admitting: Internal Medicine

## 2013-08-24 ENCOUNTER — Telehealth: Payer: Self-pay | Admitting: Family Medicine

## 2013-08-24 DIAGNOSIS — R270 Ataxia, unspecified: Secondary | ICD-10-CM

## 2013-08-24 DIAGNOSIS — R531 Weakness: Secondary | ICD-10-CM

## 2013-08-24 DIAGNOSIS — R634 Abnormal weight loss: Secondary | ICD-10-CM

## 2013-08-24 NOTE — Telephone Encounter (Signed)
I would recommend referral to advanced home care for ataxia weakness and weight loss. This patient would benefit from having a nurse evaluate the patient plus also have social worker see the patient to see what type of services she might qualify for.

## 2013-08-24 NOTE — Telephone Encounter (Signed)
Sister is wanting some help in the home with patient care. Patient's  Sister states she is getting weaker and she needs help with her care. Do you have any suggestion or what route should she take to get help with her care.

## 2013-08-25 ENCOUNTER — Other Ambulatory Visit: Payer: Self-pay | Admitting: Dermatology

## 2013-08-25 NOTE — Addendum Note (Signed)
Addended by: Dairl Ponder on: 08/25/2013 08:55 AM   Modules accepted: Orders

## 2013-08-25 NOTE — Telephone Encounter (Signed)
Referral for home health initiated in [place]. Family notified.

## 2013-08-29 ENCOUNTER — Telehealth: Payer: Self-pay | Admitting: *Deleted

## 2013-08-29 NOTE — Telephone Encounter (Signed)
Nioka's sister Tessie Fass called to let you know home health nurse came out for evaulation for hospice and feeding tube. Pt has been having constipation. Gave otc stool softner yesterday with no reflief. This am pt woke with stomach pain. Sister gave suppository. She was able to go and stomach felt better. She is complaining of her anus being sore.

## 2013-08-30 ENCOUNTER — Encounter (HOSPITAL_COMMUNITY): Payer: Self-pay | Admitting: Emergency Medicine

## 2013-08-30 ENCOUNTER — Emergency Department (HOSPITAL_COMMUNITY): Payer: Medicare Other

## 2013-08-30 ENCOUNTER — Emergency Department (HOSPITAL_COMMUNITY)
Admission: EM | Admit: 2013-08-30 | Discharge: 2013-08-30 | Disposition: A | Discharge status: Home / Self Care | Payer: Medicare Other | Attending: Emergency Medicine | Admitting: Emergency Medicine

## 2013-08-30 DIAGNOSIS — R011 Cardiac murmur, unspecified: Secondary | ICD-10-CM | POA: Insufficient documentation

## 2013-08-30 DIAGNOSIS — K219 Gastro-esophageal reflux disease without esophagitis: Secondary | ICD-10-CM | POA: Insufficient documentation

## 2013-08-30 DIAGNOSIS — D649 Anemia, unspecified: Secondary | ICD-10-CM | POA: Insufficient documentation

## 2013-08-30 DIAGNOSIS — E785 Hyperlipidemia, unspecified: Secondary | ICD-10-CM | POA: Insufficient documentation

## 2013-08-30 DIAGNOSIS — Z8781 Personal history of (healed) traumatic fracture: Secondary | ICD-10-CM | POA: Diagnosis not present

## 2013-08-30 DIAGNOSIS — Z950 Presence of cardiac pacemaker: Secondary | ICD-10-CM | POA: Diagnosis not present

## 2013-08-30 DIAGNOSIS — F3289 Other specified depressive episodes: Secondary | ICD-10-CM | POA: Diagnosis not present

## 2013-08-30 DIAGNOSIS — Z8701 Personal history of pneumonia (recurrent): Secondary | ICD-10-CM | POA: Diagnosis not present

## 2013-08-30 DIAGNOSIS — K5900 Constipation, unspecified: Secondary | ICD-10-CM | POA: Insufficient documentation

## 2013-08-30 DIAGNOSIS — Z79899 Other long term (current) drug therapy: Secondary | ICD-10-CM | POA: Insufficient documentation

## 2013-08-30 DIAGNOSIS — IMO0002 Reserved for concepts with insufficient information to code with codable children: Secondary | ICD-10-CM | POA: Diagnosis not present

## 2013-08-30 DIAGNOSIS — Z880 Allergy status to penicillin: Secondary | ICD-10-CM | POA: Insufficient documentation

## 2013-08-30 DIAGNOSIS — Z8619 Personal history of other infectious and parasitic diseases: Secondary | ICD-10-CM | POA: Insufficient documentation

## 2013-08-30 DIAGNOSIS — Z87891 Personal history of nicotine dependence: Secondary | ICD-10-CM | POA: Insufficient documentation

## 2013-08-30 DIAGNOSIS — R197 Diarrhea, unspecified: Secondary | ICD-10-CM | POA: Diagnosis not present

## 2013-08-30 DIAGNOSIS — E039 Hypothyroidism, unspecified: Secondary | ICD-10-CM | POA: Diagnosis not present

## 2013-08-30 DIAGNOSIS — J449 Chronic obstructive pulmonary disease, unspecified: Secondary | ICD-10-CM | POA: Insufficient documentation

## 2013-08-30 DIAGNOSIS — F329 Major depressive disorder, single episode, unspecified: Secondary | ICD-10-CM | POA: Diagnosis not present

## 2013-08-30 DIAGNOSIS — Z9889 Other specified postprocedural states: Secondary | ICD-10-CM | POA: Diagnosis not present

## 2013-08-30 DIAGNOSIS — J4489 Other specified chronic obstructive pulmonary disease: Secondary | ICD-10-CM | POA: Insufficient documentation

## 2013-08-30 LAB — CBC WITH DIFFERENTIAL/PLATELET
Basophils Absolute: 0 10*3/uL (ref 0.0–0.1)
Basophils Relative: 0 % (ref 0–1)
EOS PCT: 1 % (ref 0–5)
Eosinophils Absolute: 0.1 10*3/uL (ref 0.0–0.7)
HCT: 38.3 % (ref 36.0–46.0)
Hemoglobin: 12.4 g/dL (ref 12.0–15.0)
LYMPHS ABS: 2.8 10*3/uL (ref 0.7–4.0)
Lymphocytes Relative: 21 % (ref 12–46)
MCH: 31.6 pg (ref 26.0–34.0)
MCHC: 32.4 g/dL (ref 30.0–36.0)
MCV: 97.7 fL (ref 78.0–100.0)
Monocytes Absolute: 1.4 10*3/uL — ABNORMAL HIGH (ref 0.1–1.0)
Monocytes Relative: 11 % (ref 3–12)
Neutro Abs: 8.8 10*3/uL — ABNORMAL HIGH (ref 1.7–7.7)
Neutrophils Relative %: 67 % (ref 43–77)
PLATELETS: ADEQUATE 10*3/uL (ref 150–400)
RBC: 3.92 MIL/uL (ref 3.87–5.11)
RDW: 13.2 % (ref 11.5–15.5)
WBC: 13.1 10*3/uL — ABNORMAL HIGH (ref 4.0–10.5)

## 2013-08-30 LAB — URINALYSIS, ROUTINE W REFLEX MICROSCOPIC
Bilirubin Urine: NEGATIVE
Glucose, UA: NEGATIVE mg/dL
Ketones, ur: NEGATIVE mg/dL
LEUKOCYTES UA: NEGATIVE
NITRITE: NEGATIVE
Protein, ur: NEGATIVE mg/dL
Specific Gravity, Urine: 1.015 (ref 1.005–1.030)
UROBILINOGEN UA: 0.2 mg/dL (ref 0.0–1.0)
pH: 7 (ref 5.0–8.0)

## 2013-08-30 LAB — COMPREHENSIVE METABOLIC PANEL
ALT: 26 U/L (ref 0–35)
ANION GAP: 12 (ref 5–15)
AST: 23 U/L (ref 0–37)
Albumin: 3.1 g/dL — ABNORMAL LOW (ref 3.5–5.2)
Alkaline Phosphatase: 64 U/L (ref 39–117)
BUN: 24 mg/dL — ABNORMAL HIGH (ref 6–23)
CO2: 29 mEq/L (ref 19–32)
Calcium: 9 mg/dL (ref 8.4–10.5)
Chloride: 104 mEq/L (ref 96–112)
Creatinine, Ser: 0.83 mg/dL (ref 0.50–1.10)
GFR calc Af Amer: 73 mL/min — ABNORMAL LOW (ref >90)
GFR calc non Af Amer: 63 mL/min — ABNORMAL LOW (ref >90)
GLUCOSE: 94 mg/dL (ref 70–99)
Potassium: 3.2 mEq/L — ABNORMAL LOW (ref 3.7–5.3)
SODIUM: 145 meq/L (ref 137–147)
Total Bilirubin: 0.3 mg/dL (ref 0.3–1.2)
Total Protein: 6.7 g/dL (ref 6.0–8.3)

## 2013-08-30 LAB — URINE MICROSCOPIC-ADD ON

## 2013-08-30 MED ORDER — MEGESTROL ACETATE 400 MG/10ML PO SUSP: 100.0000 mg | Freq: Every day | ORAL | Status: AC

## 2013-08-30 MED ORDER — SODIUM CHLORIDE 0.9 % IV BOLUS (SEPSIS)
1000.0000 mL | Freq: Once | INTRAVENOUS | Status: AC
  Administered 2013-08-30: 1000 mL via INTRAVENOUS

## 2013-08-30 NOTE — ED Notes (Signed)
Pt comes from home via EMS with c/o diarrhea and intermittent generalized abdominal pain x2 days. Pt currently denies any pain or nausea. Pt also denies vomiting. Pt is A&Ox4.

## 2013-08-30 NOTE — Discharge Instructions (Signed)
Eat chopped up foods.  Call dr. Wonda Amis Thursday with report of eatiing

## 2013-08-30 NOTE — ED Provider Notes (Signed)
CSN: [id number]     Arrival date & time 08/30/13  1005 History  This chart was scribed for Kaitlyn Diego, MD,  by Stacy Gardner, ED Scribe. The patient was seen in room APA14/APA14 and the patient's care was started at 10:21 AM.    First MD Initiated Contact with Patient 08/30/13 1014     Chief Complaint  Patient presents with  . Diarrhea     (Consider location/radiation/quality/duration/timing/severity/associated sxs/prior Treatment) Patient is a 80 y.o. female presenting with diarrhea. The history is provided by the patient, a relative and medical records. No language interpreter was used.  Diarrhea Severity:  Mild Onset quality:  Sudden Number of episodes:  2 Duration:  2 days Timing:  Intermittent Progression:  Unchanged Relieved by:  Nothing Worsened by:  Nothing tried Associated symptoms: no abdominal pain and no vomiting    HPI Comments: Kaitlyn Cooper is a 80 y.o. female who presents to the Emergency Department complaining of intermittent diarrhea for the past two days. Pt had two episodes of diarrhea today. P  She is on a liquid diet and is able to swallow Ensure and soup without difficulty. Per pt's relative, she started eating chicken salad this week. er pt's relative, pt's last BM was "very perfuse.and has 8 lbs since being on a liquid diet. Pt lost around 20 lbs prior to the diet. Pt had an Esophagogastroduodenoscopy (egd) with esophageal dilation last year. She also fractured her T9 vertebral after a fall last year. Per pt's relative, rejected the recommendation to have a feeding tube placed because she is ready to die. Denies nausea and vomiting. Past Medical History  Diagnosis Date  . Anemia     followed by Dr.Neijstrom  . Hyperlipidemia   . Hypothyroidism   . History of atherosclerotic cardiovascular disease   . History of colonoscopy     02/2002 showed few diverticula at sigmoid colon otherwise normal colonoscopy and terminal ileoscopy 1995 several colon  polyps ,tubular adenoma  . GERD (gastroesophageal reflux disease)     chronic  . GERD (gastroesophageal reflux disease)     chronic gerd  . Tiredness   . T9 vertebral fracture 02/09/13    result of fall at home  . Adrenal insufficiency 03/23/2013  . Orthostatic hypotension 03/21/2013  . Pacemaker   . Heart murmur     "years ago"  . COPD (chronic obstructive pulmonary disease)   . Pneumonia   . Depression     takes Zoloft  . HYPERLIPIDEMIA 10/24/2008    Qualifier: Diagnosis of  By: Dwaine Gale, CNA, [place]    . C. difficile colitis 06/21/2013  . Dizziness    Past Surgical History  Procedure Laterality Date  . Insert / replace / remove pacemaker  01/17/2009    [employer] Cardiac Pacemaker   . Cardiac catheterization  1998  . Cholecystectomy    . 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 Esophagogastroduodenoscopy (egd) with esophageal dilation  03/22/2012    Procedure: ESOPHAGOGASTRODUODENOSCOPY (EGD) WITH ESOPHAGEAL DILATION;  Surgeon: Rogene Houston, MD;  [facility];  Service: Endoscopy;  Laterality: N/A;  730  . Colonoscopy N/A 12/29/2012    Procedure: COLONOSCOPY;  Surgeon: Rogene Houston, MD;  [facility];  Service: Endoscopy;  Laterality: N/A;  130  . Esophagogastroduodenoscopy (egd) with esophageal dilation N/A 02/12/2013    Procedure: ESOPHAGOGASTRODUODENOSCOPY (EGD) WITH ESOPHAGEAL DILATION;  Surgeon: Rogene Houston, MD;  [facility];  Service: Endoscopy;  Laterality: N/A;  . Breast surgery      lumpectomy - noncancerous  . Eye surgery Bilateral     cataract surgery   . Clavicle surgery Left   . Vaginal hysterectomy    . Kyphoplasty N/A 06/16/2013    Procedure: Thoracic Nine Kyphoplasty;  Surgeon: Ophelia Charter, MD;  Location: [place] [facility];  Service: Neurosurgery;  Laterality: N/A;  Thoracic Nine Kyphoplasty  . Back surgery    . Esophagogastroduodenoscopy N/A 07/05/2013    Procedure: ESOPHAGOGASTRODUODENOSCOPY  (EGD) FB removal and esophageal dilation;  Surgeon: Rogene Houston, MD;  [facility];  Service: Endoscopy;  Laterality: N/A;  . Esophagogastroduodenoscopy N/A 07/15/2013    Procedure: ESOPHAGOGASTRODUODENOSCOPY (EGD);  Surgeon: Daneil Dolin, MD;  [facility];  Service: Endoscopy;  Laterality: N/A;  . Esophageal manometry N/A 07/25/2013    Procedure: ESOPHAGEAL MANOMETRY (EM);  Surgeon: Milus Banister, MD;  Location: [facility];  Service: Endoscopy;  Laterality: N/A;  . Esophagogastroduodenoscopy N/A 08/18/2013    Procedure: ESOPHAGOGASTRODUODENOSCOPY (EGD);  Surgeon: Rogene Houston, MD;  [facility];  Service: Endoscopy;  Laterality: N/A;  EGD with Botox injection to distal esophagus 10:30   History reviewed. No pertinent family history. History  Substance Use Topics  . Smoking status: Former Smoker -- 1.00 packs/day for 40 years    Quit date: 08/25/1971  . Smokeless tobacco: Never Used  . Alcohol Use: No   OB History   Grav Para Term Preterm Abortions TAB SAB Ect Mult Living                 Review of Systems  Constitutional: Positive for appetite change and unexpected weight change.  HENT: Positive for trouble swallowing.   Gastrointestinal: Positive for diarrhea and constipation. Negative for nausea, vomiting and abdominal pain.  All other systems reviewed and are negative.     Allergies  Penicillins  Home Medications   Prior to Admission medications   Medication Sig Start Date End Date Taking? Authorizing Provider  acetaminophen (TYLENOL) 500 MG tablet Take 1,000 mg by mouth every 6 (six) hours as needed. Pain    Historical Provider, MD  atorvastatin (LIPITOR) 10 MG tablet Take 10 mg by mouth daily.    Historical Provider, MD  Cholecalciferol (VITAMIN D) 400 UNITS capsule Take 400 Units by mouth daily.      Historical Provider, MD  cyanocobalamin (,VITAMIN B-12,) 1000 MCG/ML injection Inject 1 mL (1,000 mcg total) into the muscle every  30 (thirty) days. 04/19/13   Kathyrn Drown, MD  fludrocortisone (FLORINEF) 0.1 MG tablet Take 0.1 mg by mouth daily.    Historical Provider, MD  folic acid (FOLVITE) 1 MG tablet Take 1 tablet (1 mg total) by mouth daily. 06/21/13   Radene Gunning, NP  imipramine (TOFRANIL) 10 MG tablet Take 1 tablet (10 mg total) by mouth daily. 08/10/13   Rogene Houston, MD  levothyroxine (SYNTHROID, LEVOTHROID) 75 MCG tablet Take 1 tablet (75 mcg total) by mouth daily before breakfast. 04/21/13   Kathyrn Drown, MD  loperamide (IMODIUM A-D) 2 MG tablet Take 2 mg by mouth daily as needed for diarrhea or loose stools.    Historical Provider, MD  meclizine (ANTIVERT) 25 MG tablet Take 1 tablet (25 mg total) by mouth 3 (three) times daily as needed for dizziness. Caution drowsiness 08/10/13   Kathyrn Drown, MD  megestrol (MEGACE) 20 MG tablet Take 20 mg by mouth daily.  Historical Provider, MD  midodrine (PROAMATINE) 5 MG tablet Take 5 mg by mouth 3 (three) times daily with meals.    Historical Provider, MD  omeprazole (PRILOSEC) 20 MG capsule Take 1 capsule (20 mg total) by mouth daily. 04/26/13   Kathyrn Drown, MD  oxyCODONE-acetaminophen (PERCOCET/ROXICET) 5-325 MG per tablet Take 1-2 tablets by mouth every 4 (four) hours as needed for moderate pain. 06/17/13   Ophelia Charter, MD  predniSONE (DELTASONE) 10 MG tablet Take 1 tablet (10 mg total) by mouth daily. For treatment of adrenal insufficiency. 03/25/13   Rexene Alberts, MD  sertraline (ZOLOFT) 25 MG tablet Take 1 tablet (25 mg total) by mouth daily. 04/01/13   Kathyrn Drown, MD  thiamine 100 MG tablet Take 1 tablet (100 mg total) by mouth daily. 06/21/13   Radene Gunning, NP   BP 112/53  Pulse 94  Temp(Src) 98.5 F (36.9 C)  Resp 16  Ht 5' 3.5" (1.613 m)  Wt 84 lb (38.102 kg)  BMI 14.64 kg/m2  SpO2 100% Physical Exam  Nursing note and vitals reviewed. Constitutional: She is oriented to person, place, and time. She appears well-developed. She appears cachectic.   HENT:  Head: Normocephalic.  Eyes: Conjunctivae and EOM are normal. No scleral icterus.  Neck: Neck supple. No tracheal deviation present. No thyromegaly present.  Cardiovascular: Normal rate, regular rhythm and normal heart sounds.  Exam reveals no gallop and no friction rub.   No murmur heard. Pulmonary/Chest: Effort normal and breath sounds normal.  Abdominal: She exhibits no distension. There is no tenderness. There is no rebound.  Musculoskeletal: Normal range of motion. She exhibits no edema.  Lymphadenopathy:    She has no cervical adenopathy.  Neurological: She is oriented to person, place, and time. She exhibits normal muscle tone. Coordination normal.  Skin: Skin is warm and dry. No rash noted. No erythema.  Psychiatric: She has a normal mood and affect. Her behavior is normal.    ED Course  Procedures (including critical care time). DIAGNOSTIC STUDIES: Oxygen Saturation is 100% on room air, normal by my interpretation.    COORDINATION OF CARE:  10:23 AM Discussed course of care with pt and her relative which includes IV, chest x-ray and laboratory tests. Pt and her relative understands and agrees.    Labs Review Labs Reviewed - No data to display  Imaging Review No results found.   EKG Interpretation None      MDM   Final diagnoses:  None   I spoke with Dr. Wonda Amis who suggested megace to stimulte appetitie  The chart was scribed for me under my direct supervision.  I personally performed the history, physical, and medical decision making and all procedures in the evaluation of this patient.Kaitlyn Diego, MD 08/30/13 [phone]

## 2013-08-31 ENCOUNTER — Ambulatory Visit: Payer: Medicare Other | Admitting: Family Medicine

## 2013-09-04 NOTE — Telephone Encounter (Signed)
I spoke with Patsy her sister. She is looking at the patient coming here on Friday for an office visit but if she is unable to come she will call us and we will go by the house either Thursday evening or Friday at lunch. She will let me know by Thursday.

## 2013-09-05 ENCOUNTER — Telehealth: Payer: Self-pay | Admitting: Family Medicine

## 2013-09-05 NOTE — Telephone Encounter (Signed)
They have a prescription for 60 tablets one every 4-6 hours as needed.

## 2013-09-05 NOTE — Telephone Encounter (Signed)
Last script written on 06/17/13 #100 by Dr. Arnoldo Morale

## 2013-09-05 NOTE — Telephone Encounter (Signed)
oxyCODONE-acetaminophen (PERCOCET/ROXICET) 5-325 MG per tablet   Call Patsy when this is ready for pick up   Last seen 08/09/13  Last filled unsure, script given 06/17/13

## 2013-09-06 ENCOUNTER — Telehealth: Payer: Self-pay | Admitting: Family Medicine

## 2013-09-06 MED ORDER — OXYCODONE-ACETAMINOPHEN 5-325 MG PO TABS: ORAL_TABLET | ORAL | Status: DC

## 2013-09-06 MED ORDER — NYSTATIN 100000 UNIT/ML MT SUSP: 5.0000 mL | Freq: Four times a day (QID) | OROMUCOSAL | Status: AC

## 2013-09-06 NOTE — Telephone Encounter (Signed)
Nystatin oral solution, 1 teaspoon swish and swallow, 4 times a day, 7 days

## 2013-09-06 NOTE — Telephone Encounter (Signed)
Patient has a white coating on her tongue, but says that she tastes everything the same. Wants to know if we can call something in.   [employer]

## 2013-09-06 NOTE — Telephone Encounter (Signed)
Patient's caregiver notified.

## 2013-09-06 NOTE — Telephone Encounter (Signed)
Patient's daughter notified.

## 2013-09-07 ENCOUNTER — Telehealth: Payer: Self-pay | Admitting: Family Medicine

## 2013-09-07 ENCOUNTER — Encounter: Payer: Self-pay | Admitting: *Deleted

## 2013-09-07 DIAGNOSIS — R634 Abnormal weight loss: Secondary | ICD-10-CM

## 2013-09-07 DIAGNOSIS — R5383 Other fatigue: Secondary | ICD-10-CM

## 2013-09-07 DIAGNOSIS — K449 Diaphragmatic hernia without obstruction or gangrene: Secondary | ICD-10-CM

## 2013-09-07 DIAGNOSIS — R5381 Other malaise: Secondary | ICD-10-CM

## 2013-09-07 DIAGNOSIS — R279 Unspecified lack of coordination: Secondary | ICD-10-CM

## 2013-09-07 NOTE — Telephone Encounter (Signed)
[place] to see 

## 2013-09-07 NOTE — Telephone Encounter (Signed)
I am not opposed to the 10 mg as long as it is not causing drowsiness. Please verify that the patient had been using 10 with Dr. Arnoldo Morale. You might want to call the pharmacy to verify this. Also asked the sister if this causes drowsiness. Once you do that then we can write it for the 10

## 2013-09-07 NOTE — Telephone Encounter (Signed)
On her med list, it has 5/325mg 

## 2013-09-07 NOTE — Telephone Encounter (Signed)
pts sister is here to bring back the script   It was written by Dr Arnoldo Morale for 10/325 they want to  Know if she can keep this strength?   If so can we re write the script   His was written like this Oxycodone APAP 10/325 Tab  1 tab q4h prn   [place] [facility]

## 2013-09-08 ENCOUNTER — Other Ambulatory Visit: Payer: Self-pay | Admitting: *Deleted

## 2013-09-08 MED ORDER — OXYCODONE-ACETAMINOPHEN 10-325 MG PO TABS: 1.0000 | ORAL_TABLET | Freq: Two times a day (BID) | ORAL | Status: DC | PRN

## 2013-09-08 NOTE — Telephone Encounter (Signed)
Bergen said that she last filled 10-325 oxycodone on May 15th for #100 tablets written by Dr. Arnoldo Morale.  Patsy said that Anhthu takes no more than 1 tablet BID. Some days she takes 1 tablet and sometimes she misses a day.   She also said that she wanted to cancel her appt tomorrow with you at [facility] is too weak to come in. (I told Estill Bamberg to cancel her appt per daughter's request.)   Patsy will be coming in after lunch to pick up new RX please.

## 2013-09-08 NOTE — Telephone Encounter (Signed)
May write prescription for 60 tablets 1 twice a day when necessary, oxycodone 10 mg/325

## 2013-09-08 NOTE — Telephone Encounter (Signed)
Patient's daughter notified.

## 2013-09-09 ENCOUNTER — Telehealth (INDEPENDENT_AMBULATORY_CARE_PROVIDER_SITE_OTHER): Payer: Self-pay | Admitting: *Deleted

## 2013-09-09 ENCOUNTER — Ambulatory Visit: Payer: Medicare Other | Admitting: Family Medicine

## 2013-09-09 ENCOUNTER — Telehealth: Payer: Self-pay | Admitting: Family Medicine

## 2013-09-09 NOTE — Telephone Encounter (Signed)
Will do home visit

## 2013-09-09 NOTE — Telephone Encounter (Signed)
Sister wanted to know if you were coming by at lunch for home visit. I told her I would send message back to ask.

## 2013-09-09 NOTE — Telephone Encounter (Signed)
Patient was seen as a home visit. Overall she's hanging in there she is quite weak she is eating having some difficult time swallowing she had Botox injections which helped but she is still having some troubles. Her sister will measure her weight every 5-6 days and will call us in a couple weeks regarding the weight if she continues to lose weight the next option would be a feeding tube to use at nighttime. Patient does not want to start hospice at this point in time.

## 2013-09-09 NOTE — Telephone Encounter (Signed)
I called Mrs.Pike to see how Ms. Boivin was doing, as Dr.Rehman ask. She states that Sunday 1 week ago ,patient had diarrhea. She took her to ED, test were done and patient was given fluids. Monday and Tuesday Mrs. Ladean Raya stated that the patient was like a new person. Then on Wednesday she notice a deterioration, and feels that it is related to her not being able to ingest food.  Patient has an appointment today with Dr. Tennis Must, but he has advised Mrs. Ladean Raya that if she is to weak he will come by today around lunch time to see her.

## 2013-09-10 NOTE — Telephone Encounter (Signed)
Call returned but no answer.

## 2013-09-13 ENCOUNTER — Other Ambulatory Visit: Payer: Self-pay | Admitting: *Deleted

## 2013-09-13 ENCOUNTER — Telehealth (INDEPENDENT_AMBULATORY_CARE_PROVIDER_SITE_OTHER): Payer: Self-pay | Admitting: *Deleted

## 2013-09-13 NOTE — Telephone Encounter (Signed)
Per Dr.Rehman the patient needs to have a Peg placement. Patsy was called and made aware. She states that this started last night ,prior to patient going to bed and this morning. Patsy has given her Ensure 8 oz and her medications and patient tolerated well.  Patsy questions if it was something that she ate on yesterday that caused this. She will call back if the patient has further problems.  Forwarded to [place] for review.

## 2013-09-13 NOTE — Telephone Encounter (Signed)
Patient needs to be on pured diet at all times and eat slowly. Please call again

## 2013-09-13 NOTE — Telephone Encounter (Signed)
Patient's sister, Tessie Fass , was called and made aware of Dr.Rehman's recommendation.

## 2013-09-13 NOTE — Telephone Encounter (Signed)
Patsy called stating Kaitlyn Cooper is choking again and spiting up. The return phone number is [phone].

## 2013-09-18 ENCOUNTER — Emergency Department (HOSPITAL_COMMUNITY): Payer: Medicare Other

## 2013-09-18 ENCOUNTER — Emergency Department (HOSPITAL_COMMUNITY)
Admission: EM | Admit: 2013-09-18 | Discharge: 2013-09-18 | Disposition: A | Discharge status: Home / Self Care | Payer: Medicare Other | Attending: Emergency Medicine | Admitting: Emergency Medicine

## 2013-09-18 ENCOUNTER — Encounter (HOSPITAL_COMMUNITY): Payer: Self-pay | Admitting: Emergency Medicine

## 2013-09-18 DIAGNOSIS — Z87891 Personal history of nicotine dependence: Secondary | ICD-10-CM | POA: Diagnosis not present

## 2013-09-18 DIAGNOSIS — Z8701 Personal history of pneumonia (recurrent): Secondary | ICD-10-CM | POA: Diagnosis not present

## 2013-09-18 DIAGNOSIS — Z8781 Personal history of (healed) traumatic fracture: Secondary | ICD-10-CM | POA: Insufficient documentation

## 2013-09-18 DIAGNOSIS — J4489 Other specified chronic obstructive pulmonary disease: Secondary | ICD-10-CM | POA: Insufficient documentation

## 2013-09-18 DIAGNOSIS — IMO0002 Reserved for concepts with insufficient information to code with codable children: Secondary | ICD-10-CM | POA: Diagnosis present

## 2013-09-18 DIAGNOSIS — J449 Chronic obstructive pulmonary disease, unspecified: Secondary | ICD-10-CM | POA: Insufficient documentation

## 2013-09-18 DIAGNOSIS — R011 Cardiac murmur, unspecified: Secondary | ICD-10-CM | POA: Diagnosis not present

## 2013-09-18 DIAGNOSIS — W19XXXA Unspecified fall, initial encounter: Secondary | ICD-10-CM

## 2013-09-18 DIAGNOSIS — M5430 Sciatica, unspecified side: Secondary | ICD-10-CM | POA: Diagnosis not present

## 2013-09-18 DIAGNOSIS — E039 Hypothyroidism, unspecified: Secondary | ICD-10-CM | POA: Insufficient documentation

## 2013-09-18 DIAGNOSIS — Z79899 Other long term (current) drug therapy: Secondary | ICD-10-CM | POA: Insufficient documentation

## 2013-09-18 DIAGNOSIS — Z9889 Other specified postprocedural states: Secondary | ICD-10-CM | POA: Diagnosis not present

## 2013-09-18 DIAGNOSIS — Z8679 Personal history of other diseases of the circulatory system: Secondary | ICD-10-CM | POA: Insufficient documentation

## 2013-09-18 DIAGNOSIS — Z8619 Personal history of other infectious and parasitic diseases: Secondary | ICD-10-CM | POA: Diagnosis not present

## 2013-09-18 DIAGNOSIS — Y939 Activity, unspecified: Secondary | ICD-10-CM | POA: Diagnosis not present

## 2013-09-18 DIAGNOSIS — Z880 Allergy status to penicillin: Secondary | ICD-10-CM | POA: Insufficient documentation

## 2013-09-18 DIAGNOSIS — K219 Gastro-esophageal reflux disease without esophagitis: Secondary | ICD-10-CM | POA: Insufficient documentation

## 2013-09-18 DIAGNOSIS — D649 Anemia, unspecified: Secondary | ICD-10-CM | POA: Diagnosis not present

## 2013-09-18 DIAGNOSIS — Y92009 Unspecified place in unspecified non-institutional (private) residence as the place of occurrence of the external cause: Secondary | ICD-10-CM | POA: Insufficient documentation

## 2013-09-18 DIAGNOSIS — R296 Repeated falls: Secondary | ICD-10-CM | POA: Diagnosis not present

## 2013-09-18 DIAGNOSIS — Z950 Presence of cardiac pacemaker: Secondary | ICD-10-CM | POA: Insufficient documentation

## 2013-09-18 DIAGNOSIS — F329 Major depressive disorder, single episode, unspecified: Secondary | ICD-10-CM | POA: Diagnosis not present

## 2013-09-18 DIAGNOSIS — M545 Low back pain: Secondary | ICD-10-CM

## 2013-09-18 DIAGNOSIS — F3289 Other specified depressive episodes: Secondary | ICD-10-CM | POA: Diagnosis not present

## 2013-09-18 MED ORDER — ACETAMINOPHEN 325 MG PO TABS
650.0000 mg | ORAL_TABLET | Freq: Once | ORAL | Status: AC
  Administered 2013-09-18: 650 mg via ORAL
  Filled 2013-09-18: qty 2

## 2013-09-18 NOTE — ED Provider Notes (Signed)
CSN: [id number]     Arrival date & time 09/18/13  1001 History   First MD Initiated Contact with Patient 09/18/13 1018     Chief Complaint  Patient presents with  . Fall      HPI Pt was seen at 66. Per pt and her family, c/o sudden onset and persistence of constant low back "pain" that began this morning PTA. Pt slipped and fell backwards into a door this morning, hitting her lower back against the door. Pt fell onto her buttocks on the floor. Pt was ambulatory at home after the fall. Denies hitting head, no LOC, no AMS, no neck pain, no CP/SOB, no abd pain, no N/V/D, no focal motor weakness, no tingling/numbness in extremities, no saddle anesthesia, no incont/retention of bowel or bladder.    Past Medical History  Diagnosis Date  . Anemia     followed by Dr.Neijstrom  . Hyperlipidemia   . Hypothyroidism   . History of atherosclerotic cardiovascular disease   . History of colonoscopy     02/2002 showed few diverticula at sigmoid colon otherwise normal colonoscopy and terminal ileoscopy 1995 several colon polyps ,tubular adenoma  . GERD (gastroesophageal reflux disease)     chronic  . GERD (gastroesophageal reflux disease)     chronic gerd  . Tiredness   . T9 vertebral fracture 02/09/13    result of fall at home  . Adrenal insufficiency 03/23/2013  . Orthostatic hypotension 03/21/2013  . Pacemaker   . Heart murmur     "years ago"  . COPD (chronic obstructive pulmonary disease)   . Pneumonia   . Depression     takes Zoloft  . HYPERLIPIDEMIA 10/24/2008    Qualifier: Diagnosis of  By: Dwaine Gale, CNA, [place]    . C. difficile colitis 06/21/2013  . Dizziness    Past Surgical History  Procedure Laterality Date  . Insert / replace / remove pacemaker  01/17/2009    [employer]ude Cardiac Pacemaker   . Cardiac catheterization  1998  . Cholecystectomy    . 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 Esophagogastroduodenoscopy (egd) with esophageal dilation  03/22/2012     Procedure: ESOPHAGOGASTRODUODENOSCOPY (EGD) WITH ESOPHAGEAL DILATION;  Surgeon: Rogene Houston, MD;  [facility];  Service: Endoscopy;  Laterality: N/A;  730  . Colonoscopy N/A 12/29/2012    Procedure: COLONOSCOPY;  Surgeon: Rogene Houston, MD;  [facility];  Service: Endoscopy;  Laterality: N/A;  130  . Esophagogastroduodenoscopy (egd) with esophageal dilation N/A 02/12/2013    Procedure: ESOPHAGOGASTRODUODENOSCOPY (EGD) WITH ESOPHAGEAL DILATION;  Surgeon: Rogene Houston, MD;  [facility];  Service: Endoscopy;  Laterality: N/A;  . Breast surgery      lumpectomy - noncancerous  . Eye surgery Bilateral     cataract surgery   . Clavicle surgery Left   . Vaginal hysterectomy    . Kyphoplasty N/A 06/16/2013    Procedure: Thoracic Nine Kyphoplasty;  Surgeon: Ophelia Charter, MD;  Location: [place] [facility];  Service: Neurosurgery;  Laterality: N/A;  Thoracic Nine Kyphoplasty  . Back surgery    . Esophagogastroduodenoscopy N/A 07/05/2013    Procedure: ESOPHAGOGASTRODUODENOSCOPY (EGD) FB removal and esophageal dilation;  Surgeon: Rogene Houston, MD;  [facility];  Service: Endoscopy;  Laterality: N/A;  . Esophagogastroduodenoscopy N/A 07/15/2013    Procedure: ESOPHAGOGASTRODUODENOSCOPY (EGD);  Surgeon: Daneil Dolin, MD;  [facility];  Service: Endoscopy;  Laterality:  N/A;  . Esophageal manometry N/A 07/25/2013    Procedure: ESOPHAGEAL MANOMETRY (EM);  Surgeon: Milus Banister, MD;  [facility];  Service: Endoscopy;  Laterality: N/A;  . Esophagogastroduodenoscopy N/A 08/18/2013    Procedure: ESOPHAGOGASTRODUODENOSCOPY (EGD);  Surgeon: Rogene Houston, MD;  [facility];  Service: Endoscopy;  Laterality: N/A;  EGD with Botox injection to distal esophagus 10:30    History  Substance Use Topics  . Smoking status: Former Smoker -- 1.00 packs/day for 40 years    Quit date: 08/25/1971  . Smokeless tobacco: Never Used  .  Alcohol Use: No    Review of Systems ROS: Statement: All systems negative except as marked or noted in the HPI; Constitutional: Negative for fever and chills. ; ; Eyes: Negative for eye pain, redness and discharge. ; ; ENMT: Negative for ear pain, hoarseness, nasal congestion, sinus pressure and sore throat. ; ; Cardiovascular: Negative for chest pain, palpitations, diaphoresis, dyspnea and peripheral edema. ; ; Respiratory: Negative for cough, wheezing and stridor. ; ; Gastrointestinal: Negative for nausea, vomiting, diarrhea, abdominal pain, blood in stool, hematemesis, jaundice and rectal bleeding. . ; ; Genitourinary: Negative for dysuria, flank pain and hematuria. ; ; Musculoskeletal: +LBP. Negative for neck pain. Negative for swelling and trauma.; ; Skin: Negative for pruritus, rash, abrasions, blisters, bruising and skin lesion.; ; Neuro: Negative for headache, lightheadedness and neck stiffness. Negative for weakness, altered level of consciousness , altered mental status, extremity weakness, paresthesias, involuntary movement, seizure and syncope.      Allergies  Penicillins  Home Medications   Prior to Admission medications   Medication Sig Start Date End Date Taking? Authorizing Provider  cholecalciferol (VITAMIN D) 1000 UNITS tablet Take 1,000 Units by mouth daily.   Yes Historical Provider, MD  docusate sodium (COLACE) 100 MG capsule Take 100 mg by mouth every other day.   Yes Historical Provider, MD  fludrocortisone (FLORINEF) 0.1 MG tablet Take 0.1 mg by mouth daily.   Yes Historical Provider, MD  folic acid (FOLVITE) 403 MCG tablet Take 800 mcg by mouth daily.   Yes Historical Provider, MD  imipramine (TOFRANIL) 10 MG tablet Take 1 tablet (10 mg total) by mouth daily. 08/10/13  Yes Rogene Houston, MD  levothyroxine (SYNTHROID, LEVOTHROID) 75 MCG tablet Take 1 tablet (75 mcg total) by mouth daily before breakfast. 04/21/13  Yes Kathyrn Drown, MD  loperamide (IMODIUM A-D) 2 MG  tablet Take 2 mg by mouth daily as needed for diarrhea or loose stools.   Yes Historical Provider, MD  megestrol (MEGACE) 400 MG/10ML suspension Take 2.5 mLs (100 mg total) by mouth daily. 08/30/13  Yes Maudry Diego, MD  nystatin (MYCOSTATIN) 100000 UNIT/ML suspension Take 5 mLs (500,000 Units total) by mouth 4 (four) times daily. Use for 7 days 09/06/13  Yes Kathyrn Drown, MD  omeprazole (PRILOSEC) 20 MG capsule Take 20 mg by mouth daily. 04/26/13  Yes Kathyrn Drown, MD  oxyCODONE-acetaminophen (PERCOCET) 10-325 MG per tablet Take 1 tablet by mouth 2 (two) times daily as needed for pain. 09/08/13  Yes Kathyrn Drown, MD  predniSONE (DELTASONE) 10 MG tablet Take 1 tablet (10 mg total) by mouth daily. For treatment of adrenal insufficiency. 03/25/13  Yes Rexene Alberts, MD  sertraline (ZOLOFT) 25 MG tablet Take 1 tablet (25 mg total) by mouth daily. 04/01/13  Yes Kathyrn Drown, MD  thiamine (VITAMIN B-1) 100 MG tablet Take 100 mg by mouth daily.   Yes Historical  Provider, MD  cyanocobalamin (,VITAMIN B-12,) 1000 MCG/ML injection Inject 1 mL (1,000 mcg total) into the muscle every 30 (thirty) days. 04/19/13   Kathyrn Drown, MD   BP 160/58  Pulse 61  Temp(Src) 98 F (36.7 C) (Oral)  Resp 18  SpO2 94% Physical Exam 1025: Physical examination:  Nursing notes reviewed; Vital signs and O2 SAT reviewed;  Constitutional: Thin, frail. Well hydrated, In no acute distress; Head:  Normocephalic, atraumatic; Eyes: EOMI, PERRL, No scleral icterus; ENMT: Mouth and pharynx normal, Mucous membranes moist; Neck: Supple, Full range of motion, No lymphadenopathy; Cardiovascular: Regular rate and rhythm, No gallop; Respiratory: Breath sounds clear & equal bilaterally, No wheezes.  Speaking full sentences with ease, Normal respiratory effort/excursion; Chest: Nontender, no deformity, no abrasions or ecchymosis. Movement normal; Abdomen: Soft, Nontender, Nondistended, Normal bowel sounds; Genitourinary: No CVA tenderness;  Spine:  No midline CS, TS, LS tenderness. +TTP bilat lumbar paraspinal muscles. No rash, no ecchymosis, no abrasions.;; Extremities: Pulses normal, No tenderness, No edema, No calf edema or asymmetry.; Neuro: AA&Ox3, +HOH. Major CN grossly intact.  Speech clear. No gross focal motor or sensory deficits in extremities. Strength 5/5 equal bilat UE's and LE's, including great toe dorsiflexion.  DTR 2/4 equal bilat UE's and LE's.  No gross sensory deficits.  Neg straight leg raises bilat.; Skin: Color normal, Warm, Dry.   ED Course  Procedures     MDM  MDM Reviewed: previous chart, nursing note and vitals Interpretation: x-ray   Dg Lumbar Spine Complete 09/18/2013   CLINICAL DATA:  Fall, lower back pain  EXAM: LUMBAR SPINE - COMPLETE 4+ VIEW  COMPARISON:  Prior CT abdomen/ pelvis 06/19/2013  FINDINGS: No acute fracture or malalignment. The bones are osteopenic. Extensive mild multilevel degenerative disc disease. Atherosclerotic calcifications throughout the abdominal aorta. Focal levoconvex scoliosis centered at L1-L2 similar compared to prior. Surgical clips in the right upper quadrant suggest prior cholecystectomy. Unremarkable visualized bowel gas pattern.  IMPRESSION: 1. No acute fracture or malalignment. 2. Multilevel degenerative disc disease. 3. Aortic atherosclerosis.   Electronically Signed   By: Jacqulynn Cadet M.D.   On: 09/18/2013 11:34     1225:  XR without fracture. Feels better after meds and wants to go home now. Pt already has percocet at home for chronic back pain per family. Dx and testing d/w pt and family.  Questions answered.  Verb understanding, agreeable to d/c home with outpt f/u.   Francine Graven, DO 09/20/13 Bosie Helper

## 2013-09-18 NOTE — Discharge Instructions (Signed)
°Emergency Department Resource Guide °1) Find a Doctor and Pay Out of Pocket °Although you won't have to find out who is covered by your insurance plan, it is a good idea to ask around and get recommendations. You will then need to call the office and see if the doctor you have chosen will accept you as a new patient and what types of options they offer for patients who are self-pay. Some doctors offer discounts or will set up payment plans for their patients who do not have insurance, but you will need to ask so you aren't surprised when you get to your appointment. ° °2) Contact Your Local Health Department °Not all health departments have doctors that can see patients for sick visits, but many do, so it is worth a call to see if yours does. If you don't know where your local health department is, you can check in your phone book. The CDC also has a tool to help you locate your state's health department, and many state websites also have listings of all of their local health departments. ° °3) Find a Walk-in Clinic °If your illness is not likely to be very severe or complicated, you may want to try a walk in clinic. These are popping up all over the country in pharmacies, drugstores, and shopping centers. They're usually staffed by nurse practitioners or physician assistants that have been trained to treat common illnesses and complaints. They're usually fairly quick and inexpensive. However, if you have serious medical issues or chronic medical problems, these are probably not your best option. ° °No Primary Care Doctor: °- Call [facility] Connect at  832-8000 - they can help you locate a primary care doctor that  accepts your insurance, provides certain services, etc. °- Physician Referral Service- 1-800-533-3463 ° °Chronic Pain Problems: °Organization         Address  Phone   Notes  °[facility]  (336) 297-2271 Patients need to be referred by their primary care doctor.  ° °Medication  Assistance: °Organization         Address  Phone   Notes  °[place] [facility] [address] °[place], [place] [address] (336) 641-8030 --Must be a resident of [place] °-- Must have NO insurance coverage whatsoever (no Medicaid/ Medicare, etc.) °-- The pt. MUST have a primary care doctor that directs their care regularly and follows them in the community °  °[facility]  (866) 331-1348   °[employer]  (888) 892-1162   ° °Agencies that provide inexpensive medical care: °Organization         Address  Phone   Notes  °[facility]  (336) 832-8035   ° Internal Medicine    (336) 832-7272   °[facility] [address] °[place], [place] [address] (336) 832-4777   °[facility] [address], °[place] (336) 271-4999   °[facility]    (336) 373-0678   °[facility]    (336) 272-1050   °[facility] ° [address], [place] Phone:  (336) 832-4444, Fax:  (336) 832-4440 Hours of Operation:  9 am - 6 pm, M-F.  Also accepts Medicaid/Medicare and self-pay.  ° [facility] ° [address], [place] Phone: (336) 832-3150, Fax: (336) 832-3151. Hours of Operation:  8:30 am - 5:30 pm, M-F.  Also accepts Medicaid and self-pay.  °[facility] [place] [address], [place] Phone: (336) 878-6027   °[facility] [address], [place], [place] (336)723-1848, Ext. 123 Mondays & Thursdays: 7-9 AM.  First 15 patients are seen on a first come, first serve basis. °  ° °Medicaid-accepting [place] Providers: ° °Organization         Address  Phone   Notes  °[facility] [address], [place] (336) 641-2100 Also accepts self-pay patients.  °[facility] [address], [place] ° (336) 856-9996   °[facility] [address], [place]  (336) 288-8857   °[facility] [address] [place] [address], [place] (336) 299-7000   °Veita Bland [address], [place]  ° (336) 373-1557 Only accepts [place] Access Medicaid patients after they have their name applied to their card.  ° °Self-Pay (no insurance) in [place]: ° °Organization         Address  Phone   Notes  °Sickle Cell Patients, [facility] [address], [place] (336) 832-1970   °[facility] [address], [place] (336) 832-4400   °[facility] ° [address], [place] (336) 992-4800   °[facility]/Dr. Osei-Bonsu ° [address] [place] [address], [place] or [address], [place] (336) 841-8500 Phone number for both [place] and [place] locations is the same.  °[facility] [address], [place] (336) 299-0000   °[facility] [address] [place] [address], [place] or [address] (336) 852-7530 °(336) 878-2260   °[facility] [address], [place] (336) 350-1642, phone; (336) 294-5005, fax Sees patients 1st and 3rd Saturday of every month.  Must not qualify for public or private insurance (i.e. Medicaid, Medicare, [place] Health Choice, Veterans' Benefits) • Household income should be no more than 200% of the poverty level •The clinic cannot treat you if you are pregnant or think you are pregnant • Sexually transmitted diseases are not treated at the clinic.  ° ° °Dental Care: °Organization         Address  Phone  Notes  °[place] [facility] [address], [place] (336) 641-6152 Accepts children up to age 21 who are enrolled in Medicaid or [place] Health Choice; pregnant women with a Medicaid card; and children who have applied for Medicaid or [place] Health Choice, but were declined, whose parents can pay a reduced fee at time of service.  °[place]  [facility] [place]  [address], [place] (336) 641-7733 Accepts children up to age 21 who are enrolled in Medicaid or [place] Health Choice; pregnant women with a Medicaid card; and children who have applied for Medicaid or [place] Health Choice, but were declined, whose parents can pay a reduced fee at time of service.  °[facility] ° [address],  (336) 641-4533 Patients are seen by appointment only. Walk-ins are not accepted. [facility] will see patients 18 years of age and older. °Monday - Tuesday (8am-5pm) °Most Wednesdays (8:30-5pm) °$30 per visit, cash only  °[facility] ° [address], [place] (336) 641-4533 Patients are seen by appointment only. Walk-ins are not accepted. [facility] will see patients 18 years of age and older. °One   Wednesday Evening (Monthly: Volunteer Based).  $30 per visit, cash only  °[facility]  (919) 537-3737 for adults; Children under age 4, call Graduate Pediatric Dentistry at (919) 537-3956. Children aged 4-14, please call (919) [phone] to request a pediatric application. ° Dental services are provided in all areas of dental care including fillings, crowns and bridges, complete and partial dentures, implants, gum treatment, root canals, and extractions. Preventive care is also provided. Treatment is provided to both adults and children. °Patients are selected via a lottery and there is often a waiting list. °  °[facility] [address], °[place] ° (336) 763-8833 [email or web address] °  °[facility] [address], [place], [place] (336)723-1848, Ext. 123 Second and Fourth Thursday of each month, opens at 6:30 AM; Clinic ends at 9 AM.  Patients are seen on a first-come first-served basis, and a limited number are seen during each clinic.  ° °[facility] ° [address], [place], [place] (336) 723-7904    Eligibility Requirements °You must have lived in [place], [place], or [place] counties for at least the last three months. °  You cannot be eligible for state or federal sponsored healthcare insurance, including Veterans Administration, Medicaid, or Medicare. °  You generally cannot be eligible for healthcare insurance through your employer.  °  How to apply: °Eligibility screenings are held every Tuesday and Wednesday afternoon from 1:00 pm until 4:00 pm. You do not need an appointment for the interview!  °[facility] [address], [place], [place] 336-631-2330   °[facility] [facility]  336-342-8273   °[facility]  336-703-3100   °[facility]  336-570-6415   ° °Behavioral Health Resources in the Community: °Intensive Outpatient Programs °Organization         Address  Phone  Notes  °[facility] [address], [place], [place] 336-878-6098   °[facility] [address], [place], [place] 336-832-9800   °[facility] [address], [place], [place] ° 336-882-2125   °[facility] [address],  °[place], [place] 1-800-853-5163 or 336-641-4981   °Substance Abuse Resources °Organization         Address  Phone  Notes  °[facility]  336-882-2125   °[facility]  336-784-9470   °[facility]  336-285-9073   °[facility]  336-845-3988   °Residential & Outpatient Substance Abuse Program  1-800-659-3381   °Psychological Services °Organization         Address  Phone  Notes  °[facility]  336- 832-9600   °[facility]  336- 378-7881   °[facility] [address], [place] 1-800-853-5163 or 336-641-4981   ° °Mobile Crisis Teams °Organization         Address  Phone  Notes  °[facility], Mobile Crisis Care Unit  1-877-626-1772   °Assertive °[facility] ° [address]  [place], [place] 336-834-9664   °Sharon DeEsch [address] °[place] 336-554-5454   ° °Self-Help/Support Groups °Organization         Address  Phone             Notes  °[facility] [place] - variety of support groups  336- 373-1402 Call for more information  °Narcotics Anonymous (NA), [facility] [address], °[place]  2 meetings at this location  ° °  Residential Treatment Programs °Organization         Address  Phone  Notes  °[facility] [address],    °[place]  1-866-801-8205   °[facility] ° [address], [place], [place] 704-293-8524   °[facility] [address], [place] 336-845-3988 Admissions: 8am-3pm M-F  °[facility] [address]    °[place], [place] 336-841-1104   °[facility] [address], [place], [place] 336-379-7146   °[facility] [address]  °[place], [place] 336-285-9073   °[facility] - Intensive Outpatient [address], [place], [place] 336-852-3033   °[facility] [address]  °[place], [place] 1-877-615-2722 or 336-784-9470   °[facility] [address] [place], [place] 336-227-7417 Accepts Medicaid  °[facility] [address]  °[place] 1-800-659-3381 Substance Abuse/Addiction Treatment  ° °[place] Behavioral Health Resources °Organization         Address  Phone  Notes  °[facility]  (888) 581-9988   °Julie Brannon, PhD [address] [place], [place]   (336) 349-5553 or (336) 951-0000   °[facility]   [address] °[place], [place] (336) 349-4454   °[facility] [address], [place], [place] (336) 342-8316 Insurance/Medicaid/sponsorship through [facility]  °[facility] [address]                                    [place], [place] (336) 342-8316 Therapy/tele-psych/case    °[facility] [address]  ° [place], [place] (336) 349-2233    °Dr. Arfeen  (336) 349-4544   °[facility] [place]  [employer] [place] [facility] 1) [address], [place] °2) [address], [place] °3)  [address], [place] (336) 349-3220 °(336) 342-7768 ° °(336) 342-8140   °[place] Child Abuse [facility] (336) 342-1394 or (336) 342-3537 (After Hours)    ° ° °Take your usual prescriptions as previously directed.  Apply moist heat or ice to the area(s) of discomfort, for 15 minutes at a time, several times per day for the next few days.  Do not fall asleep on a heating or ice pack.  Call your regular medical doctor tomorrow to schedule a follow up appointment in the next 2 days.  Return to the Emergency Department immediately if worsening. ° °

## 2013-09-18 NOTE — ED Notes (Signed)
Pt c/o lower back pain after falling backwards today.  denies loc.

## 2013-09-20 ENCOUNTER — Telehealth: Payer: Self-pay | Admitting: *Deleted

## 2013-09-20 NOTE — Telephone Encounter (Signed)
Spoke with sister- she stated she would like to give it another day and will call back to schedule office visit if not feeling better tomm.

## 2013-09-20 NOTE — Telephone Encounter (Signed)
CLINICAL DATA: Fall, lower back pain  EXAM:  LUMBAR SPINE - COMPLETE 4+ VIEW  COMPARISON: Prior CT abdomen/ pelvis 06/19/2013  FINDINGS:  No acute fracture or malalignment. The bones are osteopenic.  Extensive mild multilevel degenerative disc disease. Atherosclerotic  calcifications throughout the abdominal aorta. Focal levoconvex  scoliosis centered at L1-L2 similar compared to prior. Surgical  clips in the right upper quadrant suggest prior cholecystectomy.  Unremarkable visualized bowel gas pattern.  IMPRESSION:  1. No acute fracture or malalignment.  2. Multilevel degenerative disc disease.  3. Aortic atherosclerosis.

## 2013-09-20 NOTE — Telephone Encounter (Signed)
The x-rays that were completed and was on her lower back he did not show a fracture. If the patient is having pain in the rib areas the x-rays they did would not have shown that. Ideally it would be best if she is having ongoing trouble for [place] to see her. Please discuss with Patsy if Alli is having severe pain to bring her by here. I could even come out to the car and listening to her heart and lungs and examined her ribs if they come to the back door. Try to be accommodating. This patient is very weak. Thank you let me know

## 2013-09-20 NOTE — Telephone Encounter (Signed)
Patsy called for pt, per Patsy pt fell and hurt her back and she went to [facility] and they did x-rays on pt, per Patsy the Er doctor told them she did not break anything and they did 5 x-rays, Patsy stated pt is still hurting in her back and ribs. Patsy wants to clarify about patient's x-rays. Please advise [phone]

## 2013-09-29 ENCOUNTER — Telehealth: Payer: Self-pay | Admitting: *Deleted

## 2013-09-29 NOTE — Telephone Encounter (Signed)
FYI: Sarah from [facility] called to give you an update on Kaitlyn Cooper. Pt is weak, not eating well, (drinks 2 Ensures daily), poor skin turgor (not drinking much), her vitals are good, but she is very tearful and depressed. There still is no decision made about hospice.

## 2013-10-03 NOTE — Telephone Encounter (Addendum)
Kaitlyn Cooper not really eating or drinking much. Crying a lot - all out of sorts. Keeps saying she is going this time -she don't want to stay and when they ask her what she means she says she is going to see [place] and don't want to stay here and she is going this time. She talks to people who are not there. Sister is very concerned -wonders if this is from malnutrition or not sure what going on.

## 2013-10-03 NOTE — Telephone Encounter (Signed)
I will go Weds to see her

## 2013-10-03 NOTE — Telephone Encounter (Signed)
Please call the sister, Tessie Fass, please find out how the patient is doing over the past several days. House her weight? [place]itzer feedings? I can come by either Tuesday or Wednesday to check in on her.

## 2013-10-05 ENCOUNTER — Ambulatory Visit (INDEPENDENT_AMBULATORY_CARE_PROVIDER_SITE_OTHER): Payer: Medicare Other | Admitting: Family Medicine

## 2013-10-05 DIAGNOSIS — I2510 Atherosclerotic heart disease of native coronary artery without angina pectoris: Secondary | ICD-10-CM

## 2013-10-05 DIAGNOSIS — R634 Abnormal weight loss: Secondary | ICD-10-CM

## 2013-10-05 DIAGNOSIS — R1319 Other dysphagia: Secondary | ICD-10-CM

## 2013-10-06 MED ORDER — OXYCODONE-ACETAMINOPHEN 10-325 MG PO TABS: 1.0000 | ORAL_TABLET | Freq: Two times a day (BID) | ORAL | Status: DC | PRN

## 2013-10-06 NOTE — Progress Notes (Signed)
   Subjective:    Patient ID: Kaitlyn Cooper, female    DOB: 1926-08-12, 81 y.o.   MRN: [id number]  HPI  This patient has multiple chronic health issues including frailty, weakness, esophageal dysmotility with poor nutrition, weight loss, history of him anemia She eats very little drinks very little. Does not feel like getting out her sister is caring for her. Review of Systems Patient denies shortness of breath she does relate weakness she states no shortness of breath currently but has had some times where she gets short winded when she moves around denies chest pain    Objective:   Physical Exam  lungs are clear heart regular pulse normal bruising no new pressure sores are noted.ted on the arms   currently patient is  End stage. She will not desire hospice at this time but is considering it. She is DO NOT RESUSCITATE. Comfort care was recommended. She could live several more weeks but I seriously doubt she will live more than a few months.   I have reordered her oxycodone as she uses sporadically for discomfort no more than 2 times a day. We will see the patient back in approximately one month. This was a home visit. Patient is homebound.     AssesHistory of anemia. Patient in stage. Will not do lab works currently in [place]:

## 2013-10-11 ENCOUNTER — Telehealth: Payer: Self-pay | Admitting: Family Medicine

## 2013-10-11 NOTE — Telephone Encounter (Signed)
Spoke with Dorothea Ogle at [facility]-they do have the Rx and will fill it today-Patsy notified.

## 2013-10-11 NOTE — Telephone Encounter (Signed)
Please call over to [facility]. I dropped this prescription off on Friday. Gave it to the tach who came to the window. I went through the drive through. If for some reason they don't have it then reprint it. Please figure this out. In form family that I did in fact give it to Kaitlyn Cooper, let me know.

## 2013-10-11 NOTE — Telephone Encounter (Signed)
Kaitlyn Cooper said that Dr. Nicki Reaper was going to drop Rx for patients oxycodone off at [facility], but pharmacy is saying that they never received it. Please advise.

## 2013-10-13 ENCOUNTER — Telehealth: Payer: Self-pay | Admitting: Family Medicine

## 2013-10-13 NOTE — Telephone Encounter (Signed)
She may have a referral for palliative care. Please help assist with this. If there any forms we need to sign we will

## 2013-10-13 NOTE — Telephone Encounter (Signed)
Kaitlyn Cooper from [facility] said that she talked to patients sister and they agreed that palative care may be good for patient since she doesn't want Hospice. Kaitlyn Cooper wants to know if Dr. Nicki Reaper agrees with this and if she can get a referral.

## 2013-10-14 NOTE — Telephone Encounter (Signed)
Notified Kaitlyn Cooper she may have a referral for palliative care. She stated that if anything further is needed from [place] they will call or fax.

## 2013-10-20 ENCOUNTER — Other Ambulatory Visit: Payer: Self-pay | Admitting: *Deleted

## 2013-10-20 DIAGNOSIS — E43 Unspecified severe protein-calorie malnutrition: Secondary | ICD-10-CM

## 2013-10-24 ENCOUNTER — Other Ambulatory Visit: Payer: Self-pay | Admitting: Family Medicine

## 2013-10-28 ENCOUNTER — Ambulatory Visit (INDEPENDENT_AMBULATORY_CARE_PROVIDER_SITE_OTHER): Payer: Medicare Other | Admitting: *Deleted

## 2013-10-28 DIAGNOSIS — I498 Other specified cardiac arrhythmias: Secondary | ICD-10-CM | POA: Diagnosis not present

## 2013-10-28 LAB — MDC_IDC_ENUM_SESS_TYPE_INCLINIC
Battery Remaining Longevity: 103.2 mo
Brady Statistic RV Percent Paced: 0.08 %
Implantable Pulse Generator Model: 2110
Implantable Pulse Generator Serial Number: 7094651
Lead Channel Impedance Value: 375 Ohm
Lead Channel Impedance Value: 625 Ohm
Lead Channel Pacing Threshold Amplitude: 0.75 V
Lead Channel Pacing Threshold Amplitude: 0.75 V
Lead Channel Pacing Threshold Pulse Width: 0.4 ms
Lead Channel Sensing Intrinsic Amplitude: 12 mV
Lead Channel Setting Pacing Pulse Width: 0.4 ms
MDC IDC MSMT BATTERY VOLTAGE: 2.92 V
MDC IDC MSMT LEADCHNL RA SENSING INTR AMPL: 5 mV
MDC IDC MSMT LEADCHNL RV PACING THRESHOLD PULSEWIDTH: 0.4 ms
MDC IDC SESS DTM: 20150911154128
MDC IDC SET LEADCHNL RA PACING AMPLITUDE: 1.75 V
MDC IDC SET LEADCHNL RV PACING AMPLITUDE: 1 V
MDC IDC SET LEADCHNL RV SENSING SENSITIVITY: 2 mV
MDC IDC STAT BRADY RA PERCENT PACED: 52 %

## 2013-10-28 NOTE — Progress Notes (Signed)
PPM check in the home.

## 2013-11-08 ENCOUNTER — Encounter: Payer: Self-pay | Admitting: Internal Medicine

## 2013-11-15 ENCOUNTER — Other Ambulatory Visit: Payer: Self-pay | Admitting: Family Medicine

## 2013-11-22 ENCOUNTER — Other Ambulatory Visit: Payer: Self-pay

## 2013-11-22 MED ORDER — OXYCODONE-ACETAMINOPHEN 10-325 MG PO TABS: 1.0000 | ORAL_TABLET | Freq: Two times a day (BID) | ORAL | Status: AC | PRN

## 2013-11-23 ENCOUNTER — Telehealth: Payer: Self-pay | Admitting: Family Medicine

## 2013-11-23 NOTE — Telephone Encounter (Signed)
Ntsw. Hospice see tod or yest? If not, they need to eval her. SawRehman in July, doubt he would do a lot at this point. If hospcie saw tod or yest what did they say? After getting answetrs to all this, let fam know Kaitlyn Cooper will see in the morn and forward to him

## 2013-11-23 NOTE — Telephone Encounter (Signed)
Patient cant keep any liguids down. She seen Dr. Laural Golden and he enlarged her e and sister wants to know if she should contact him. She is in hospice care now.

## 2013-11-23 NOTE — Telephone Encounter (Signed)
Patient is no longer able to swallow anything including liquids. Sister spoke with Hospice nurse who feels it is due to her weakness and worsening  condition. Family called Dr Laural Golden to see if he can stretch her esophagus again-they have not called her back and she wants to know your opinion on if you think it would help Kaitlyn Cooper to undergo the procedure again and if you think she is strong enough to be put to sleep for it. Sister wants to know if you agree with the Hospice nurse that it is part of her worsening condition or if stretching her throat would help.  Sister wants to make sure she is exhausting all possible treatments and avenues before just giving up and doing nothing.

## 2013-11-23 NOTE — Telephone Encounter (Signed)
Depends, let sister Tessie Fass) know I can drop by at lunch on Thursday

## 2013-11-24 NOTE — Telephone Encounter (Signed)
Sister said she was able to start swallowing some last night and this am

## 2013-11-24 NOTE — Telephone Encounter (Signed)
I went and saw patient regarding her issue. I do not think there is anything gastroenterology can do. We will connect with gastroenterologist. Nurse's #1 left knee speak with Dr. Dereck Leep or Deberah Castle his physician assistant. #2 nurses, please call hospice nurse for this patient asked them regarding B12 shots and flu vaccines. This patient has been on B12 shots monthly in the past they would like to do this. Can hospice to do this with an order? Can they do a flu vaccine?

## 2013-11-25 ENCOUNTER — Telehealth: Payer: Self-pay | Admitting: *Deleted

## 2013-11-25 NOTE — Telephone Encounter (Signed)
Per Dr. Nicki Reaper: Please do orders for hospice. Order #1-flu vaccine IM ordered #2 B12 1 cc IM monthly

## 2013-11-25 NOTE — Telephone Encounter (Signed)
Rxs called into [place] for delivery.

## 2013-11-25 NOTE — Telephone Encounter (Signed)
It should be noted that I did speak with Dr. Dereck Leep regarding her condition. He did not recommend dilation. He spoke with the sister last night so therefore no need for [place] to call the sister.

## 2013-11-25 NOTE — Telephone Encounter (Signed)
Please do orders for hospice. Order #1-flu vaccine IM ordered #2 B12 1 cc IM monthly.

## 2013-11-25 NOTE — Telephone Encounter (Signed)
Hospice stated they can but need to fax out order on letterhead that stated the info for flu and vit b12 and state direction-Hospice patient and deliver.

## 2014-01-03 ENCOUNTER — Telehealth: Payer: Self-pay | Admitting: Family Medicine

## 2014-01-03 NOTE — Telephone Encounter (Signed)
[facility] Dept is calling to say they need a letter on letter head  Saying you did in fact sign off on her death certificate.   She says a date was not put with the signature and they can't finish their end till  Fax this letter over  Please fax to Attn: Lorriane Shire [phone]

## 2014-01-03 NOTE — Telephone Encounter (Signed)
Please fax over a letter that I just did. If there is additional input necessary please let me know

## 2014-01-17 DEATH — deceased

## 2014-05-18 ENCOUNTER — Encounter: Payer: Self-pay | Admitting: *Deleted

## 2014-06-14 ENCOUNTER — Encounter: Payer: Self-pay | Admitting: *Deleted

## 2014-07-21 ENCOUNTER — Encounter: Payer: Self-pay | Admitting: *Deleted

## 2015-07-09 IMAGING — RF DG ESOPHAGUS
19 of 24 series · 19 of 24 positions shown · non-contrast
Comparison: None

FLUOROSCOPY TIME:  2 min 30 seconds

CLINICAL DATA: Dysphagia, feels like food getting stuck in upper to
mid chest, past history of esophageal stricture and dilatation

EXAM:
ESOPHOGRAM/BARIUM SWALLOW
TECHNIQUE: Single contrast examination was performed using thin barium. Patient
also swallowed a 12.5 mm diameter barium tablet.

[Series 1: run · 1 of 1 slices shown (1 of 19)]
[im 1/1]
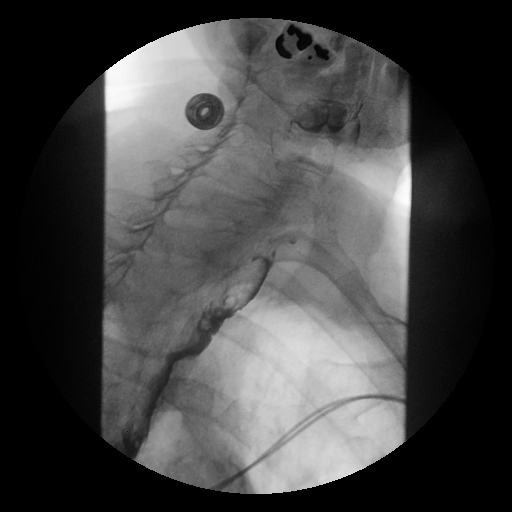

[Series 2: run · 1 of 1 slices shown (2 of 19)]
[im 1/1]
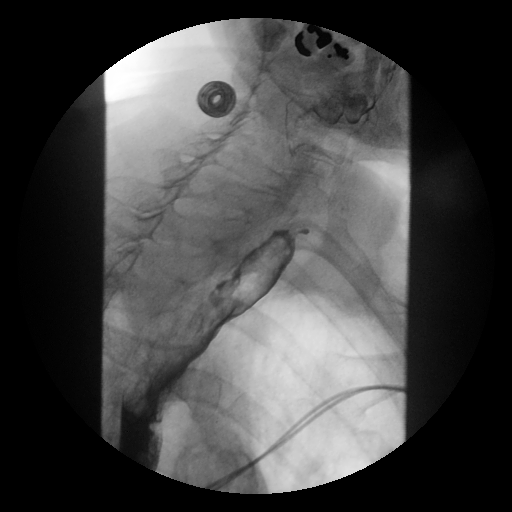

[Series 4: run · 1 of 1 slices shown (3 of 19)]
[im 1/1]
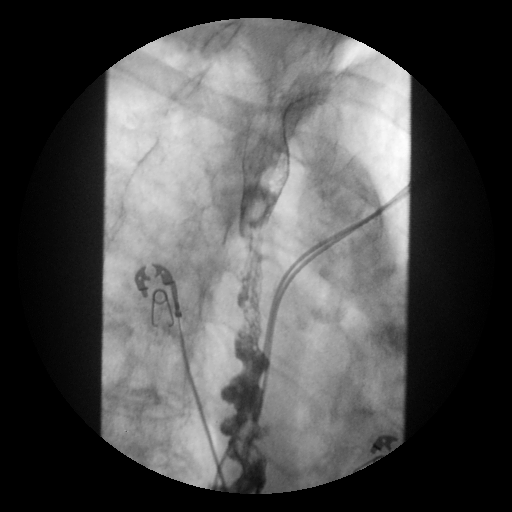

[Series 5: run · 1 of 1 slices shown (4 of 19)]
[im 1/1]
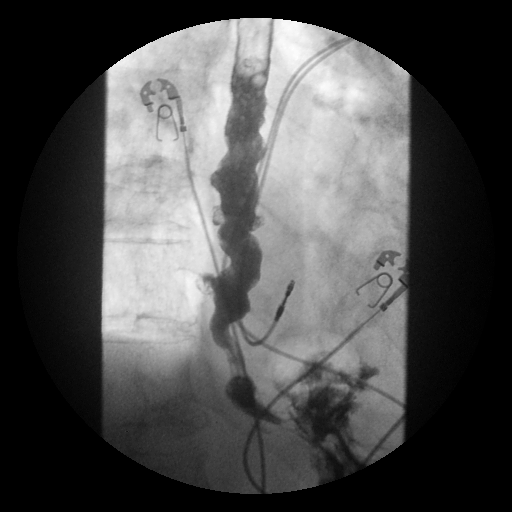

[Series 6: run · 1 of 1 slices shown (5 of 19)]
[im 1/1]
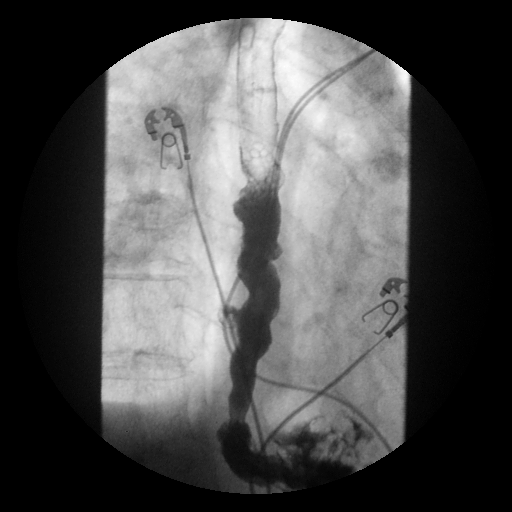

[Series 7: run · 1 of 1 slices shown (6 of 19)]
[im 1/1]
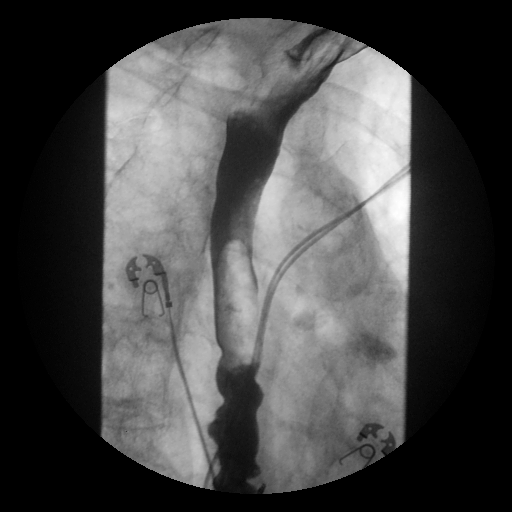

[Series 9: run · 1 of 1 slices shown (7 of 19)]
[im 1/1]
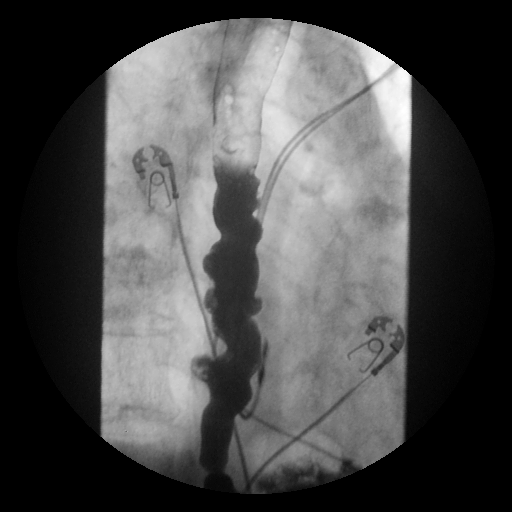

[Series 10: run · 1 of 1 slices shown (8 of 19)]
[im 1/1]
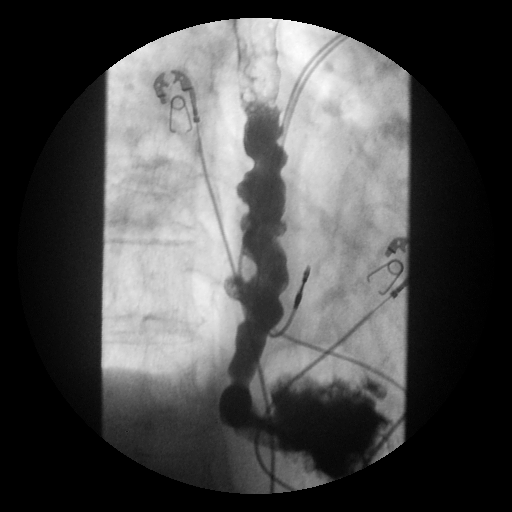

[Series 11: run · 1 of 1 slices shown (9 of 19)]
[im 1/1]
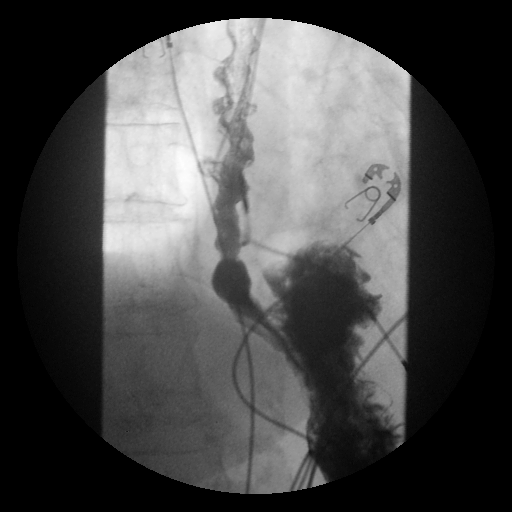

[Series 13: run · 1 of 1 slices shown (10 of 19)]
[im 1/1]
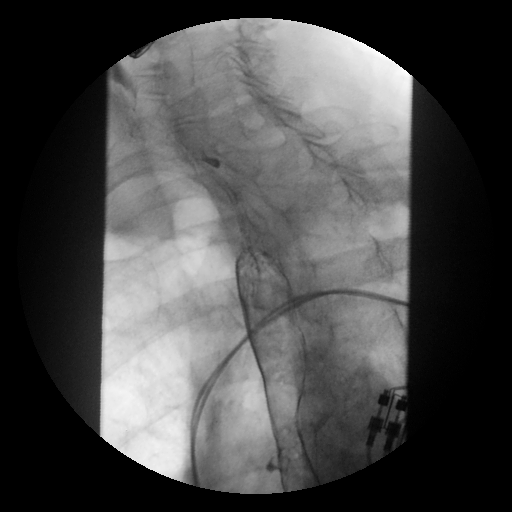

[Series 14: run · 1 of 1 slices shown (11 of 19)]
[im 1/1]
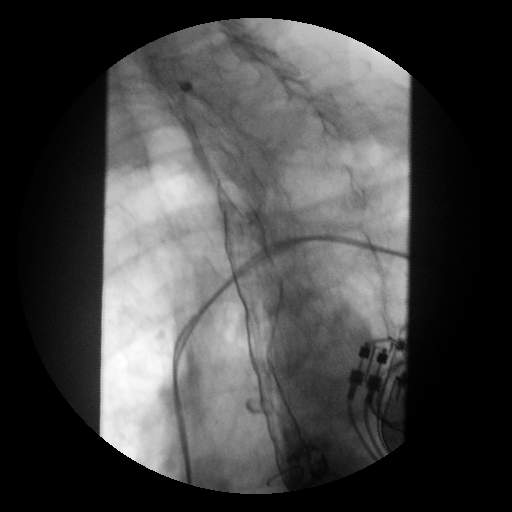

[Series 15: run · 1 of 1 slices shown (12 of 19)]
[im 1/1]
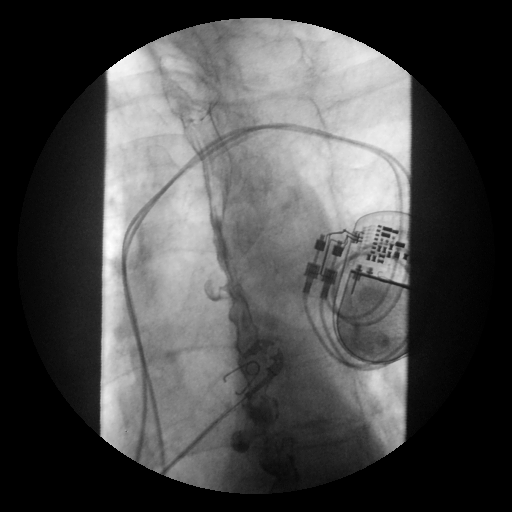

[Series 16: run · 1 of 1 slices shown (13 of 19)]
[im 1/1]
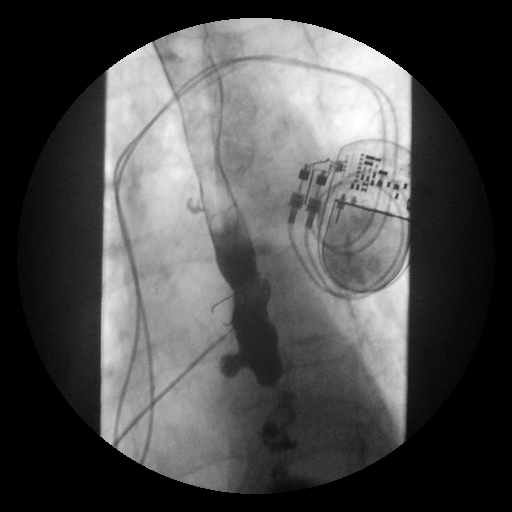

[Series 18: run · 1 of 1 slices shown (14 of 19)]
[im 1/1]
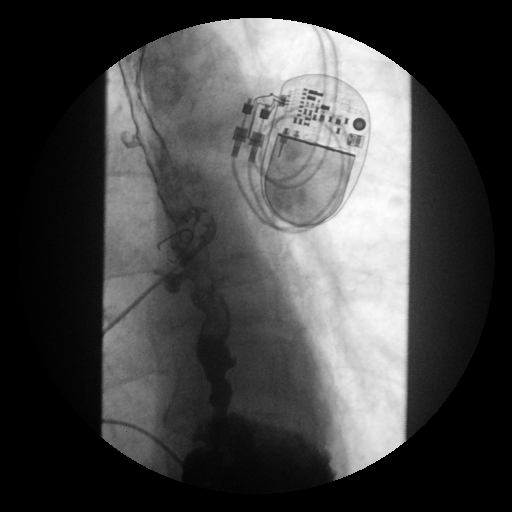

[Series 19: run · 1 of 1 slices shown (15 of 19)]
[im 1/1]
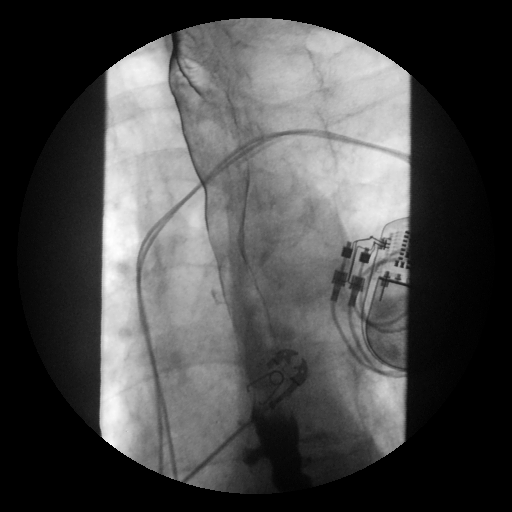

[Series 20: run · 1 of 1 slices shown (16 of 19)]
[im 1/1]
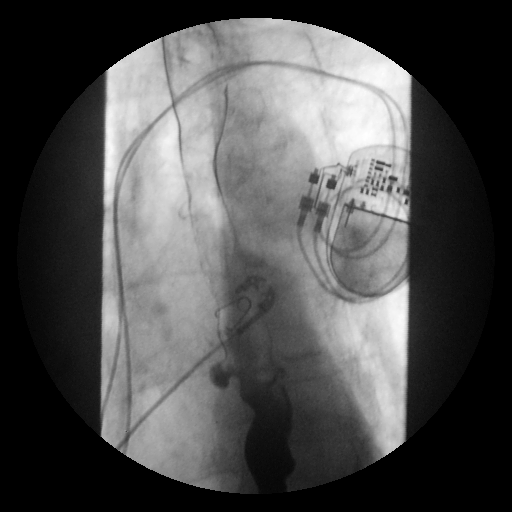

[Series 21: run · 1 of 1 slices shown (17 of 19)]
[im 1/1]
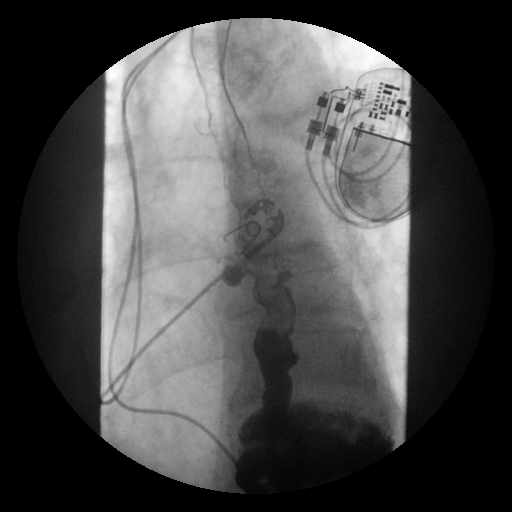

[Series 23: run · 1 of 1 slices shown (18 of 19)]
[im 1/1]
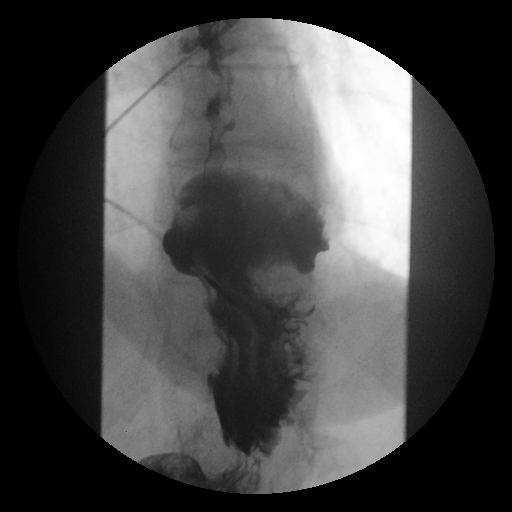

[Series 24: run · 1 of 1 slices shown (19 of 19)]
[im 1/1]
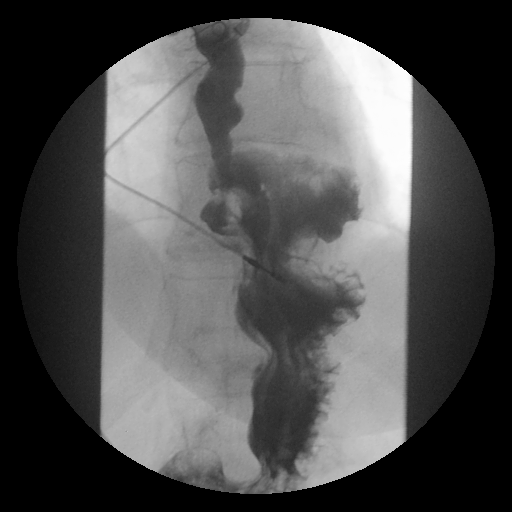

[19 of 24 positions shown; findings below may reference images not displayed]

FINDINGS: Severe diffuse impairment of esophageal motility.

Poor clearance of tracer by the primary peristaltic waves is
identified.

Numerous secondary and tertiary waves are seen.

Intermittent retrograde peristalsis.

Tiny Zenker diverticulum.

Additional diverticula are seen at the mid to distal thoracic
esophagus.

Wall appears smooth without irregularity or ulceration.

Moderate sized hiatal hernia identified.

12.5 mm diameter barium tablet obstructed at the distal esophagus
several cm proximal to the gastroesophageal junction and could not
pass despite multiple swallows of barium and water.

No persistent intraluminal filling defects.

Targeted rapid sequence imaging of the cervical esophagus and
hypopharynx demonstrated no laryngeal penetration or aspiration.

Piriform sinus residuals were noted unilaterally, seen only on
lateral view, likely on right.
IMPRESSION: Significant diffuse impairment of esophageal motility; clearance of
contrast from the esophagus is facilitated by upright positioning
and gravity.

Tiny Zenker diverticulum.

Multiple mid to distal thoracic esophageal diverticula.

Moderate-sized hiatal hernia.

Obstruction of a 12.5 mm diameter barium tablet several cm proximal
to the GE junction compatible with stricture.

## 2015-08-03 ENCOUNTER — Other Ambulatory Visit: Payer: Self-pay | Admitting: Nurse Practitioner
